# Patient Record
Sex: Female | Born: 1990 | Race: White | Hispanic: No | Marital: Married | Smoking: Never smoker
Health system: Southern US, Community
[De-identification: ages and names within clinical notes are randomized; demographics above are authoritative.]

## PROBLEM LIST (undated history)

## (undated) DIAGNOSIS — F411 Generalized anxiety disorder: Secondary | ICD-10-CM

## (undated) DIAGNOSIS — M419 Scoliosis, unspecified: Secondary | ICD-10-CM

## (undated) DIAGNOSIS — K3189 Other diseases of stomach and duodenum: Secondary | ICD-10-CM

## (undated) DIAGNOSIS — A020 Salmonella enteritis: Secondary | ICD-10-CM

## (undated) DIAGNOSIS — F4521 Hypochondriasis: Secondary | ICD-10-CM

## (undated) DIAGNOSIS — E669 Obesity, unspecified: Secondary | ICD-10-CM

## (undated) DIAGNOSIS — G894 Chronic pain syndrome: Secondary | ICD-10-CM

## (undated) DIAGNOSIS — B2790 Infectious mononucleosis, unspecified without complication: Secondary | ICD-10-CM

## (undated) DIAGNOSIS — N946 Dysmenorrhea, unspecified: Secondary | ICD-10-CM

## (undated) DIAGNOSIS — G43909 Migraine, unspecified, not intractable, without status migrainosus: Secondary | ICD-10-CM

## (undated) DIAGNOSIS — N939 Abnormal uterine and vaginal bleeding, unspecified: Secondary | ICD-10-CM

## (undated) DIAGNOSIS — E66811 Obesity, class 1: Secondary | ICD-10-CM

## (undated) DIAGNOSIS — Z8619 Personal history of other infectious and parasitic diseases: Secondary | ICD-10-CM

## (undated) DIAGNOSIS — O24419 Gestational diabetes mellitus in pregnancy, unspecified control: Secondary | ICD-10-CM

## (undated) DIAGNOSIS — R109 Unspecified abdominal pain: Secondary | ICD-10-CM

## (undated) DIAGNOSIS — O139 Gestational [pregnancy-induced] hypertension without significant proteinuria, unspecified trimester: Secondary | ICD-10-CM

## (undated) DIAGNOSIS — F319 Bipolar disorder, unspecified: Secondary | ICD-10-CM

## (undated) DIAGNOSIS — M5417 Radiculopathy, lumbosacral region: Secondary | ICD-10-CM

## (undated) DIAGNOSIS — R55 Syncope and collapse: Secondary | ICD-10-CM

## (undated) HISTORY — DX: Generalized anxiety disorder: F41.1

## (undated) HISTORY — DX: Dysmenorrhea, unspecified: N94.6

## (undated) HISTORY — DX: Infectious mononucleosis, unspecified without complication: B27.90

## (undated) HISTORY — DX: Migraine, unspecified, not intractable, without status migrainosus: G43.909

## (undated) HISTORY — DX: Personal history of other infectious and parasitic diseases: Z86.19

## (undated) HISTORY — DX: Obesity, unspecified: E66.9

## (undated) HISTORY — DX: Other diseases of stomach and duodenum: K31.89

## (undated) HISTORY — DX: Abnormal uterine and vaginal bleeding, unspecified: N93.9

## (undated) HISTORY — DX: Hypochondriasis: F45.21

## (undated) HISTORY — DX: Chronic pain syndrome: G89.4

## (undated) HISTORY — PX: WISDOM TOOTH EXTRACTION: SHX21

## (undated) HISTORY — DX: Radiculopathy, lumbosacral region: M54.17

## (undated) HISTORY — DX: Bipolar disorder, unspecified: F31.9

## (undated) HISTORY — DX: Salmonella enteritis: A02.0

## (undated) HISTORY — DX: Syncope and collapse: R55

## (undated) HISTORY — DX: Obesity, class 1: E66.811

---
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
## 2012-01-20 ENCOUNTER — Emergency Department (HOSPITAL_COMMUNITY)
Admission: EM | Admit: 2012-01-20 | Discharge: 2012-01-20 | Disposition: A | Discharge status: Home / Self Care | Payer: BC Managed Care – PPO | Attending: Emergency Medicine | Admitting: Emergency Medicine

## 2012-01-20 ENCOUNTER — Emergency Department (HOSPITAL_COMMUNITY): Payer: BC Managed Care – PPO

## 2012-01-20 ENCOUNTER — Encounter (HOSPITAL_COMMUNITY): Payer: Self-pay

## 2012-01-20 DIAGNOSIS — R109 Unspecified abdominal pain: Secondary | ICD-10-CM | POA: Insufficient documentation

## 2012-01-20 LAB — COMPREHENSIVE METABOLIC PANEL
ALT: 16 U/L (ref 0–35)
AST: 15 U/L (ref 0–37)
CO2: 25 mEq/L (ref 19–32)
Chloride: 109 mEq/L (ref 96–112)
GFR calc non Af Amer: >90 mL/min (ref >90)
Sodium: 142 mEq/L (ref 135–145)
Total Bilirubin: 0.2 mg/dL — ABNORMAL LOW (ref 0.3–1.2)

## 2012-01-20 LAB — URINALYSIS, ROUTINE W REFLEX MICROSCOPIC
Glucose, UA: NEGATIVE mg/dL
Leukocytes, UA: NEGATIVE
Protein, ur: NEGATIVE mg/dL
Urobilinogen, UA: 1 mg/dL (ref 0.0–1.0)

## 2012-01-20 LAB — URINE MICROSCOPIC-ADD ON

## 2012-01-20 LAB — CBC WITH DIFFERENTIAL/PLATELET
Basophils Absolute: 0 10*3/uL (ref 0.0–0.1)
Lymphocytes Relative: 28 % (ref 12–46)
Neutro Abs: 5.6 10*3/uL (ref 1.7–7.7)
Neutrophils Relative %: 66 % (ref 43–77)
Platelets: 274 10*3/uL (ref 150–400)
RDW: 12.4 % (ref 11.5–15.5)
WBC: 8.6 10*3/uL (ref 4.0–10.5)

## 2012-01-20 MED ORDER — SODIUM CHLORIDE 0.9 % IV BOLUS (SEPSIS)
1000.0000 mL | Freq: Once | INTRAVENOUS | Status: AC
  Administered 2012-01-20: 1000 mL via INTRAVENOUS

## 2012-01-20 MED ORDER — KETOROLAC TROMETHAMINE 30 MG/ML IJ SOLN
30.0000 mg | Freq: Once | INTRAMUSCULAR | Status: AC
  Administered 2012-01-20: 30 mg via INTRAVENOUS
  Filled 2012-01-20: qty 1

## 2012-01-20 MED ORDER — SODIUM CHLORIDE 0.9 % IV SOLN: Freq: Once | INTRAVENOUS | Status: DC

## 2012-01-20 MED ORDER — OXYCODONE-ACETAMINOPHEN 5-325 MG PO TABS: 1.0000 | ORAL_TABLET | ORAL | Status: AC | PRN

## 2012-01-20 MED ORDER — METOCLOPRAMIDE HCL 10 MG PO TABS: 10.0000 mg | ORAL_TABLET | Freq: Four times a day (QID) | ORAL | Status: DC | PRN

## 2012-01-20 MED ORDER — ONDANSETRON HCL 4 MG/2ML IJ SOLN
4.0000 mg | Freq: Once | INTRAMUSCULAR | Status: AC
  Administered 2012-01-20: 4 mg via INTRAVENOUS
  Filled 2012-01-20: qty 2

## 2012-01-20 NOTE — ED Notes (Signed)
Left in c/o significant other for transport home; alert; in no distress; instructions/prescriptions reviewed and f/u information provided; verbalizes understanding.

## 2012-01-20 NOTE — ED Notes (Signed)
Pt reports sharp, shooting, RLQ pain for the past 2 days.  Pt reports that the pain has worsened today.  Pt denies any n/v.

## 2012-01-20 NOTE — ED Provider Notes (Signed)
History   This chart was scribed for Dione Booze, MD by Melba Coon. The patient was seen in room APA01/APA01 and the patient's care was started at 4:55PM.    CSN: [id number]  Arrival date & time 01/20/12  1549   First MD Initiated Contact with Patient 01/20/12 1647      Chief Complaint  Patient presents with  . Abdominal Pain    (Consider location/radiation/quality/duration/timing/severity/associated sxs/prior treatment) The history is provided by the patient. No language interpreter was used.   Susan Chandler is a 21 y.o. female who presents to the Emergency Department complaining of intermittent, moderate, right-sided, sharp, abdominal and right-sided back pain with an onset 2 days ago. Pt states that the pain started around onset but went away as the day progressed; pain came back while pt was standing at work today. Standing up aggravates the pain while sitting down alleviates the pain. Pt rates the severity of the pain 6/10 at its worse but 4/10 presently. Pt has not taken OTC drugs at home for the pain. Nausea present. No HA, fever, neck pain, sore throat, rash, CP, SOB, vomit, diarrhea, dysuria, or extremity pain, edema, weakness, numbness, or tingling. No known allergies. No other pertinent medical symptoms.  History reviewed. No pertinent past medical history.  History reviewed. No pertinent past surgical history.  No family history on file.  History  Substance Use Topics  . Smoking status: Not on file  . Smokeless tobacco: Not on file  . Alcohol Use: No    OB History    Grav Para Term Preterm Abortions TAB SAB Ect Mult Living                  Review of Systems 10 Systems reviewed and all are negative for acute change except as noted in the HPI.   Allergies  Review of patient's allergies indicates no known allergies.  Home Medications   Current Outpatient Rx  Name Route Sig Dispense Refill  . NORGESTIM-ETH ESTRAD TRIPHASIC 0.18/0.215/0.25 MG-35 MCG PO  TABS Oral Take 1 tablet by mouth daily.      BP 121/89  Pulse 99  Temp 98 F (36.7 C) (Oral)  Ht 5\' 6"  (1.676 m)  Wt 175 lb (79.379 kg)  BMI 28.25 kg/m2  SpO2 98%  LMP 12/30/2011  Physical Exam  Nursing note and vitals reviewed. Constitutional: She is oriented to person, place, and time. She appears well-developed and well-nourished. No distress.  HENT:  Head: Normocephalic and atraumatic.  Right Ear: External ear normal.  Left Ear: External ear normal.  Eyes: EOM are normal.  Neck: Normal range of motion. No tracheal deviation present.  Cardiovascular: Normal rate, regular rhythm and normal heart sounds.   Pulmonary/Chest: Effort normal. No respiratory distress.  Abdominal: Soft. There is tenderness (Moderate RLQ).       Decreased bowel sounds.  Musculoskeletal: Normal range of motion. She exhibits tenderness (Moderate right CVA tenderness). She exhibits no edema.  Neurological: She is alert and oriented to person, place, and time.  Skin: Skin is warm and dry. No rash noted.  Psychiatric: She has a normal mood and affect. Her behavior is normal.    ED Course  Procedures (including critical care time)  DIAGNOSTIC STUDIES: Oxygen Saturation is 98% on room air, normal by my interpretation.    COORDINATION OF CARE:  5:00PM - EDMD will order IV fluids, toradol, zofran, abd CT, blood w/u, and UA for the pt.  Results for orders placed during the hospital encounter  of 01/20/12  URINALYSIS, ROUTINE W REFLEX MICROSCOPIC      Component Value Range   Color, Urine AMBER (*) YELLOW   APPearance CLEAR  CLEAR   Specific Gravity, Urine >1.030 (*) 1.005 - 1.030   pH 6.0  5.0 - 8.0   Glucose, UA NEGATIVE  NEGATIVE mg/dL   Hgb urine dipstick LARGE (*) NEGATIVE   Bilirubin Urine NEGATIVE  NEGATIVE   Ketones, ur NEGATIVE  NEGATIVE mg/dL   Protein, ur NEGATIVE  NEGATIVE mg/dL   Urobilinogen, UA 1.0  0.0 - 1.0 mg/dL   Nitrite NEGATIVE  NEGATIVE   Leukocytes, UA NEGATIVE  NEGATIVE    CBC WITH DIFFERENTIAL      Component Value Range   WBC 8.6  4.0 - 10.5 K/uL   RBC 4.01  3.87 - 5.11 MIL/uL   Hemoglobin 12.4  12.0 - 15.0 g/dL   HCT 16.1  09.6 - 04.5 %   MCV 90.0  78.0 - 100.0 fL   MCH 30.9  26.0 - 34.0 pg   MCHC 34.3  30.0 - 36.0 g/dL   RDW 40.9  81.1 - 91.4 %   Platelets 274  150 - 400 K/uL   Neutrophils Relative 66  43 - 77 %   Neutro Abs 5.6  1.7 - 7.7 K/uL   Lymphocytes Relative 28  12 - 46 %   Lymphs Abs 2.4  0.7 - 4.0 K/uL   Monocytes Relative 6  3 - 12 %   Monocytes Absolute 0.5  0.1 - 1.0 K/uL   Eosinophils Relative 1  0 - 5 %   Eosinophils Absolute 0.1  0.0 - 0.7 K/uL   Basophils Relative 0  0 - 1 %   Basophils Absolute 0.0  0.0 - 0.1 K/uL  COMPREHENSIVE METABOLIC PANEL      Component Value Range   Sodium 142  135 - 145 mEq/L   Potassium 4.0  3.5 - 5.1 mEq/L   Chloride 109  96 - 112 mEq/L   CO2 25  19 - 32 mEq/L   Glucose, Bld 101 (*) 70 - 99 mg/dL   BUN 12  6 - 23 mg/dL   Creatinine, Ser 7.82  0.50 - 1.10 mg/dL   Calcium 8.6  8.4 - 95.6 mg/dL   Total Protein 6.8  6.0 - 8.3 g/dL   Albumin 3.3 (*) 3.5 - 5.2 g/dL   AST 15  0 - 37 U/L   ALT 16  0 - 35 U/L   Alkaline Phosphatase 40  39 - 117 U/L   Total Bilirubin 0.2 (*) 0.3 - 1.2 mg/dL   GFR calc non Af Amer >90  >90 mL/min   GFR calc Af Amer >90  >90 mL/min  LIPASE, BLOOD      Component Value Range   Lipase 29  11 - 59 U/L  POCT PREGNANCY, URINE      Component Value Range   Preg Test, Ur NEGATIVE  NEGATIVE  URINE MICROSCOPIC-ADD ON      Component Value Range   Squamous Epithelial / LPF MANY (*) RARE   WBC, UA 11-20  <3 WBC/hpf   RBC / HPF 7-10  <3 RBC/hpf   Bacteria, UA FEW (*) RARE   Ct Abdomen Pelvis Wo Contrast  01/20/2012  *RADIOLOGY REPORT*  Clinical Data: Right flank pain  CT ABDOMEN AND PELVIS WITHOUT CONTRAST  Technique:  Multidetector CT imaging of the abdomen and pelvis was performed following the standard protocol without intravenous contrast.  Comparison:  None.  Findings:  Lung bases are unremarkable.  Sagittal images of the spine are unremarkable.  No destructive bony lesions are noted.  Unenhanced liver, spleen, pancreas and adrenals are unremarkable.  Gallbladder is contracted without evidence of calcified gallstones.  Unenhanced kidneys are symmetrical in size.  Faint bilateral calyceal calcifications are suggested without confluent calcified calculi.  No hydronephrosis or hydroureter.  No calcified ureteral calculi are noted bilaterally.  Stool noted in the right colon and cecum.  There is no pericecal inflammation.  The appendix is not identified.  The terminal ileum is unremarkable.  No small bowel obstruction.  No ascites or free air.  No adenopathy.  Abdominal aorta is unremarkable.  Bilateral distal ureter is unremarkable.  Unenhanced uterus and ovaries are unremarkable.  Urinary bladder is unremarkable.  No calcified calculi are noted within urinary bladder.  The uterus is retroflexed.  No destructive bony lesions are noted within pelvis.  IMPRESSION:  1.  Faint bilateral renal calyceal calcifications are suggested without confluent calcified calculi.  No ureteral calcified calculi are noted bilaterally.  No hydronephrosis or hydroureter. 2.  Stool noted in the right colon and cecum.  No pericecal inflammation.  The appendix is not identified.  Unremarkable terminal ileum. 3.  Retroflexed uterus. 4.  Unremarkable urinary bladder.  Original Report Authenticated By: Natasha Mead, M.D.      1. Right flank pain   2. Abdominal pain       MDM  Workup is associated negative. Mild pyuria is noted however, many epithelial cells are present indicating this is a contaminated specimen. CT is normal. She got good relief of pain withketorolac and ondansetron. She is in a prescription for Percocet and metoclopramide and told to use over-the-counter analgesics as needed and return to the emergency department as needed.  I personally performed the services described in this  documentation, which was scribed in my presence. The recorded information has been reviewed and considered.         Dione Booze, MD 01/20/12 1910

## 2013-06-24 DIAGNOSIS — M5417 Radiculopathy, lumbosacral region: Secondary | ICD-10-CM

## 2013-06-24 HISTORY — DX: Radiculopathy, lumbosacral region: M54.17

## 2013-07-15 ENCOUNTER — Other Ambulatory Visit: Payer: Self-pay | Admitting: Rehabilitation

## 2013-07-15 DIAGNOSIS — M545 Low back pain, unspecified: Secondary | ICD-10-CM

## 2013-07-17 ENCOUNTER — Ambulatory Visit
Admission: RE | Admit: 2013-07-17 | Discharge: 2013-07-17 | Disposition: A | Discharge status: Home / Self Care | Payer: BC Managed Care – PPO | Source: Ambulatory Visit | Attending: Rehabilitation | Admitting: Rehabilitation

## 2013-07-17 DIAGNOSIS — M545 Low back pain, unspecified: Secondary | ICD-10-CM

## 2013-10-18 ENCOUNTER — Ambulatory Visit (INDEPENDENT_AMBULATORY_CARE_PROVIDER_SITE_OTHER): Payer: 59 | Admitting: Family Medicine

## 2013-10-18 ENCOUNTER — Encounter: Payer: Self-pay | Admitting: Family Medicine

## 2013-10-18 VITALS — BP 108/77 | HR 113 | Temp 97.6°F | Resp 18 | Ht 66.0 in | Wt 204.0 lb

## 2013-10-18 DIAGNOSIS — M5416 Radiculopathy, lumbar region: Secondary | ICD-10-CM | POA: Insufficient documentation

## 2013-10-18 DIAGNOSIS — IMO0002 Reserved for concepts with insufficient information to code with codable children: Secondary | ICD-10-CM

## 2013-10-18 MED ORDER — OXYCODONE-ACETAMINOPHEN 5-325 MG PO TABS: ORAL_TABLET | ORAL | Status: DC

## 2013-10-18 MED ORDER — CYCLOBENZAPRINE HCL 10 MG PO TABS: 10.0000 mg | ORAL_TABLET | Freq: Three times a day (TID) | ORAL | Status: DC | PRN

## 2013-10-18 NOTE — Progress Notes (Signed)
Pre visit review using our clinic review tool, if applicable. No additional management support is needed unless otherwise documented below in the visit note. 

## 2013-10-18 NOTE — Progress Notes (Signed)
Office Note 10/18/2013  CC:  Chief Complaint  Patient presents with  . Back Pain    started back in October, MRI in epic, wants second opinion possible referral to another neurosurgeon    HPI:  Susan Chandler is a 23 y.o. White female who is here to establish care and discuss back pain. Patient's most recent primary MD: [facility] in [place], [place]. Old records were not reviewed prior to or during today's visit except [place] controlled substance website which showed one rx for oxycodone filled by pt 04/13/13 in [place], [place] by a [place] named Cherrie Gauze??  Also reviewed pt's back MRI from 06/2013.  Onset of back pain 03/2013 acutely, had been lifting/bending at work a lot to lift things but no distinct action/accident occurred that brought on immediate pain.  No workers comp claim.  The pain starts in low back and radiates down left glut and entire left upper leg, says she is never comfortable, worse standing up.  Occ tingle feeling in left thigh. Chiropracter was seen initially, no changes with this therapy.  Chiro and or her PMD sent her to Dr. Retia Passe in [place] ([facility] and MRI L spine was done 06/2013.  I reviewed results of this today: see section under "Lab" below.  She was given prednisone and things got a bit better and she was able to return to light duty at work. She then exacerbated her back pain about 3 wks ago--essentially the same pain intensity and location as prior to her MRI, at which time she went back to chiro for a couple of days until she could get back into NS.  NS visit #2: MD said to try another course of steroids (5 day pack) since this initially brought some improvement.  He apparently told pt she was going to have to deal with the pain and pt and her husband were put off by his manner, so they are asking for referral to new specialist for 2nd opinion.  Pt denies that any PT was ever prescribed.  Besides the prednisone, her Family MD initially rx'd naproxen and  this didn't help (back in Oct/Nov time frame). Gabapentin was rx'd 10/07/13 by her neurosurgeon and she didn't try this b/c of fear of the side effects. Currently she is taking only her OCP.  Her menses is due in the next few days.  Past Medical History  Diagnosis Date  . Infectious mononucleosis 2007    ?whooping cough, too??    Past Surgical History  Procedure Laterality Date  . Wisdom tooth extraction      History reviewed. No pertinent family history.  History   Social History  . Marital Status: Married    Spouse Name: N/A    Number of Children: N/A  . Years of Education: N/A   Occupational History  . Not on file.   Social History Main Topics  . Smoking status: Never Smoker   . Smokeless tobacco: Not on file  . Alcohol Use: No  . Drug Use: No  . Sexual Activity: Not on file   Other Topics Concern  . Not on file   Social History Narrative   Married, no children.   College: [employer].   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   No T/A/Ds.   MEDS: ortho tri cyclen 28  No Known Allergies  ROS Review of Systems  Constitutional: Negative for fever and fatigue.  HENT: Negative for congestion and sore throat.   Eyes: Negative  for visual disturbance.  Respiratory: Negative for cough.   Cardiovascular: Negative for chest pain.  Gastrointestinal: Negative for nausea and abdominal pain.  Genitourinary: Negative for dysuria.  Musculoskeletal: Positive for gait problem. Negative for arthralgias, joint swelling and neck pain.  Skin: Negative for rash.  Neurological: Negative for weakness and headaches.  Hematological: Negative for adenopathy.  Psychiatric/Behavioral: Negative for dysphoric mood. The patient is not nervous/anxious.     PE; Blood pressure 108/77, pulse 113, temperature 97.6 F (36.4 C), temperature source Temporal, resp. rate 18, height 5\' 6"  (1.676 m), weight 204 lb (92.534 kg), last menstrual period 09/22/2013, SpO2 97.00%. Gen: Alert, well  appearing, obese white female who walks slowly with the assistance of her husband.  Patient is oriented to person, place, time, and situation. AFFECT: pleasant, lucid thought and speech.  Judgement intact. WUJ:WJXBENT:Eyes: no injection, icteris, swelling, or exudate.  EOMI, PERRLA. Mouth: lips without lesion/swelling.  Oral mucosa pink and moist. Oropharynx without erythema, exudate, or swelling.  Neck - No masses or thyromegaly or limitation in range of motion CV: RRR, no m/r/g.   LUNGS: CTA bilat, nonlabored resps, good aeration in all lung fields. ABD: soft, NT EXT: no clubbing, cyanosis, or edema.  BACK: tender diffusely across entire lumbosacral spine region, some paraspinous muscle tightening, L>R. ROM of L spine significantly limited in all motions by pain, patient walks with antalgic gait, favoring right leg. She sits in a position that puts most of her weight on her right gluteal surface. Sitting SLR neg on right, + on left at 20 deg of extension (elicits low back pain). No hip tenderness or pain with hip ROM.  Fine touch sensation diminished on left foot diffusely. DP and PT pulses 2+ bilat. DTRs: patellar--3+ on right, 2+ on left.   Achilles--1+ on right, absent on left No deficit in strength on left when allowing for patient's pain with moving left leg.   Pertinent labs:  None today  Pertinent Imaging: Noncontrast MRI L spine 07/15/13:  IMPRESSION:  L5-S1 bulge and superimposed small central disc protrusion which  insinuates between the upper S1 nerve roots which are not compressed  or displaced. No thecal sac compromise.  L4-5 minimal to mild bulge most notable centrally with very minimal  indentation ventral aspect of the thecal sac. T11-12 through L3-4 unremarkable.  ASSESSMENT AND PLAN:   New pt: obtain old records.  Left lumbar radiculopathy This has been going on 34mo now. Symptoms prior to imaging were same as current symptoms.  Imaging showed no lesion that really  correlates with her symptoms. Given patient's dissatisfaction with her current back specialist, I will refer her for second opinion to [facility].  I do not think re-imaging of her back is indicated prior to her seeing Dr. Ethelene Halamos.  I stressed to patient that PT is likely going to help, and that further meds at this time should be limited to symptomatic meds used sparingly.  To this point, I rx'd percocet 5/325, #30 today, with instructions to take 1-2 q6h prn severe pain.  Also, flexeril 10mg , 1 tab q8h prn, #30, RF x 1.  No further steroids or NSAIDs at this time.   If appt with Dr. Ethelene Halamos is going to be >10 d from now, will go ahead and make local PT referral [facility]). At this time I cannot see her doing any work, seeing as how she can barely walk or bend her back without extreme discomfort.  Therefore I wrote her  a note excusing her from work from today to 11/17/13.     An After Visit Summary was printed and given to the patient.  Spent 45 min with pt today, with >50% of this time spent in counseling and care coordination regarding the above problems.  Return in about 1 month (around 11/17/2013) for f/u lumbar radiculopathy.

## 2013-10-18 NOTE — Assessment & Plan Note (Signed)
This has been going on 39mo now. Symptoms prior to imaging were same as current symptoms.  Imaging showed no lesion that really correlates with her symptoms. Given patient's dissatisfaction with her current back specialist, I will refer her for second opinion to [facility].  I do not think re-imaging of her back is indicated prior to her seeing Dr. Ethelene Halamos.  I stressed to patient that PT is likely going to help, and that further meds at this time should be limited to symptomatic meds used sparingly.  To this point, I rx'd percocet 5/325, #30 today, with instructions to take 1-2 q6h prn severe pain.  Also, flexeril 10mg , 1 tab q8h prn, #30, RF x 1.  No further steroids or NSAIDs at this time.   If appt with Dr. Ethelene Halamos is going to be >10 d from now, will go ahead and make local PT referral [facility]). At this time I cannot see her doing any work, seeing as how she can barely walk or bend her back without extreme discomfort.  Therefore I wrote her a note excusing her from work from today to 11/17/13.

## 2013-10-27 ENCOUNTER — Encounter: Payer: Self-pay | Admitting: Family Medicine

## 2013-11-17 ENCOUNTER — Encounter: Payer: Self-pay | Admitting: Family Medicine

## 2013-11-17 ENCOUNTER — Ambulatory Visit (INDEPENDENT_AMBULATORY_CARE_PROVIDER_SITE_OTHER): Payer: 59 | Admitting: Family Medicine

## 2013-11-17 VITALS — BP 108/75 | HR 100 | Temp 98.8°F | Ht 66.0 in | Wt 198.0 lb

## 2013-11-17 DIAGNOSIS — M5416 Radiculopathy, lumbar region: Secondary | ICD-10-CM

## 2013-11-17 DIAGNOSIS — IMO0002 Reserved for concepts with insufficient information to code with codable children: Secondary | ICD-10-CM

## 2013-11-17 NOTE — Progress Notes (Signed)
Pre visit review using our clinic review tool, if applicable. No additional management support is needed unless otherwise documented below in the visit note. 

## 2013-11-17 NOTE — Progress Notes (Signed)
OFFICE NOTE  11/17/2013  CC:  Chief Complaint  Patient presents with  . Follow-up    1 month     HPI: Patient is a 23 y.o. Caucasian female who is here for 1 mo f/u lumbar radiculopathy. I referred her to Dr. Shelle Iron last visit. Back injection recommended.  Her pain is stable.  Injection planned for 12/01/13. Patient taking minimal percocet and flexeril.  She is also awaiting insurance approval of PT.  Pertinent PMH:  Past Medical History  Diagnosis Date  . Infectious mononucleosis 2007    ?whooping cough, too??  . Lumbosacral radiculopathy at L5 2015    [facility].   Past Surgical History  Procedure Laterality Date  . Wisdom tooth extraction     History   Social History Narrative   Married, no children.   College: [employer].   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   Moved to [place], [place] in 2015.   No T/A/Ds.   No exercise.    MEDS:  Outpatient Prescriptions Prior to Visit  Medication Sig Dispense Refill  . cyclobenzaprine (FLEXERIL) 10 MG tablet Take 1 tablet (10 mg total) by mouth 3 (three) times daily as needed for muscle spasms.  30 tablet  1  . Norgestimate-Ethinyl Estradiol Triphasic (ORTHO TRI-CYCLEN, 28,) 0.18/0.215/0.25 MG-35 MCG tablet Take 1 tablet by mouth daily.      [name] oxyCODONE-acetaminophen (PERCOCET/ROXICET) 5-325 MG per tablet 1-2 tabs po q6h prn severe pain  30 tablet  0   No facility-administered medications prior to visit.    PE: Blood pressure 108/75, pulse 100, temperature 98.8 F (37.1 C), temperature source Temporal, height 5\' 6"  (1.676 m), weight 198 lb (89.812 kg), last menstrual period 09/22/2013, SpO2 98.00%. Gen: Alert, well appearing.  Patient is oriented to person, place, time, and situation. Left lumbar region TTP.  No spinous process tenderness.  Sitting SLR on left side elicits left LB with radiation into glut region with minimal extension of left lower leg.   Mild weakness in quadriceps and  hamstrings due to pain.  No other extremity weakness noted.  No sensory deficit.  IMPRESSION AND PLAN:  1) Chronic lumbar radiculopathy: has been evaluated by ortho, is set to get spinal steroid injection and also PT. She has muscle relaxer and pain med to take prn--no new rx's given today. She is remaining out of work at this time, which I think is appropriate.  An After Visit Summary was printed and given to the patient.  FOLLOW UP: prn

## 2013-12-21 ENCOUNTER — Telehealth: Payer: Self-pay

## 2013-12-21 ENCOUNTER — Encounter: Payer: Self-pay | Admitting: Family Medicine

## 2013-12-21 MED ORDER — OXYCODONE-ACETAMINOPHEN 5-325 MG PO TABS: ORAL_TABLET | ORAL | Status: DC

## 2013-12-21 NOTE — Telephone Encounter (Signed)
Oxycodone/APAP rx printed.

## 2013-12-21 NOTE — Telephone Encounter (Signed)
Pt called requesting a refill on her oxycodone. She states she has received an injection from her doctor and it only helped a little bit. Her doctor wants to spread her injections out and didn't schedule her for another one. Her physical therapy appt is coming up also.  I advised her that she may have to get Rx from her ortho/neuro doctor. She wanted me to check with Dr. Milinda CaveMcGowen first. Please advise.

## 2013-12-22 NOTE — Telephone Encounter (Signed)
Called pt to let her know, Rx ready to pick up.

## 2014-02-04 ENCOUNTER — Encounter: Payer: Self-pay | Admitting: Family Medicine

## 2014-02-04 ENCOUNTER — Ambulatory Visit (INDEPENDENT_AMBULATORY_CARE_PROVIDER_SITE_OTHER): Payer: 59 | Admitting: Family Medicine

## 2014-02-04 VITALS — BP 108/77 | HR 62 | Temp 98.6°F | Resp 18 | Ht 66.0 in | Wt 186.0 lb

## 2014-02-04 DIAGNOSIS — G4700 Insomnia, unspecified: Secondary | ICD-10-CM

## 2014-02-04 DIAGNOSIS — F411 Generalized anxiety disorder: Secondary | ICD-10-CM | POA: Insufficient documentation

## 2014-02-04 MED ORDER — ZOLPIDEM TARTRATE 10 MG PO TABS: 10.0000 mg | ORAL_TABLET | Freq: Every evening | ORAL | Status: DC | PRN

## 2014-02-04 MED ORDER — CITALOPRAM HYDROBROMIDE 20 MG PO TABS: 20.0000 mg | ORAL_TABLET | Freq: Every day | ORAL | Status: DC

## 2014-02-04 NOTE — Progress Notes (Signed)
OFFICE NOTE  02/04/2014  CC:  Chief Complaint  Patient presents with  . Insomnia    x October   . Back Pain   HPI: Patient is a 23 y.o. Caucasian female who is here for insomnia.   Starting around 03/2013, problems initiating sleep and maintaining sleep.  Oxycodone for pain helps her back pain but she says it does not activate her or make her insomnia worse.  Not working right now. Usually tries to sleep from 11 p-9 A.  No napping in daytime.  Has TV on with volume off--"for the light" which is no change from prior to having probs sleeping.  No RLS but some constant shifting due to LB and left leg radiculopathy.  She has had one ESI and there is plan to get a 2nd one in the near future.  She has tried melatonin, helps her relax some.   Sister gave her a 5mg  ambien and it didn't help any.  Pt has never been rx'd any sleep med. She is frustrated when she is laying there not sleeping.  She chronically worries--this has been over about the past year, question of some panic sx's associated with right sided chest pains in the past. Beats herself up over not being able to work.  Denies depressed, sad, or hopeless mood. Has never been on antidepressant as treatment for mood or anxiety d/o.   Pertinent PMH:  Past Medical History  Diagnosis Date  . Infectious mononucleosis 2007    ?whooping cough, too??  . Lumbosacral radiculopathy at L5 2015    [facility]; then [facility], where she got ESI left L5-S1 11/2013 with moderate improvement.   Past Surgical History  Procedure Laterality Date  . Wisdom tooth extraction     History   Social History Narrative   Married, no children.   College: [employer].   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   [place] in [place].   No T/A/Ds.   No exercise.    MEDS: Not taking flexeril listed below. Outpatient Prescriptions Prior to Visit  Medication Sig Dispense Refill  .  Norgestimate-Ethinyl Estradiol Triphasic (ORTHO TRI-CYCLEN, 28,) 0.18/0.215/0.25 MG-35 MCG tablet Take 1 tablet by mouth daily.      . cyclobenzaprine (FLEXERIL) 10 MG tablet Take 1 tablet (10 mg total) by mouth 3 (three) times daily as needed for muscle spasms.  30 tablet  1  . oxyCODONE-acetaminophen (PERCOCET/ROXICET) 5-325 MG per tablet 1-2 tabs po q6h prn severe pain  30 tablet  0   No facility-administered medications prior to visit.    PE: Blood pressure 108/77, pulse 62, temperature 98.6 F (37 C), temperature source Temporal, resp. rate 18, height 5\' 6"  (1.676 m), weight 186 lb (84.369 kg), SpO2 98.00%. Gen: Alert, well appearing.  Patient is oriented to person, place, time, and situation. AFFECT: pleasant, lucid thought and speech. No further exam today.  IMPRESSION AND PLAN:  GAD with insomnia. Start citalopram 20mg  qd.  Start ambien 10mg  qhs prn.  Therapeutic expectations and side effect profile of medications discussed today.  Patient's questions answered.  An After Visit Summary was printed and given to the patient.  FOLLOW UP: 22mo

## 2014-02-04 NOTE — Progress Notes (Signed)
Pre visit review using our clinic review tool, if applicable. No additional management support is needed unless otherwise documented below in the visit note. 

## 2014-02-07 ENCOUNTER — Telehealth: Payer: Self-pay | Admitting: Family Medicine

## 2014-02-07 MED ORDER — OXYCODONE-ACETAMINOPHEN 5-325 MG PO TABS: ORAL_TABLET | ORAL | Status: DC

## 2014-02-07 NOTE — Telephone Encounter (Signed)
LM for pt to p/u Rx at front desk.

## 2014-02-07 NOTE — Telephone Encounter (Signed)
Pt forgot to ask you at her past appointment for refill of oxycodone.  Last Rx printed 12/21/13 # 30.  Please advise.

## 2014-02-07 NOTE — Telephone Encounter (Signed)
Printed oxycodone rx per pt request.

## 2014-02-25 ENCOUNTER — Encounter: Payer: Self-pay | Admitting: Family Medicine

## 2014-02-25 ENCOUNTER — Ambulatory Visit (INDEPENDENT_AMBULATORY_CARE_PROVIDER_SITE_OTHER): Payer: 59 | Admitting: Family Medicine

## 2014-02-25 VITALS — BP 110/75 | HR 70 | Temp 98.8°F | Resp 16 | Ht 66.0 in | Wt 183.0 lb

## 2014-02-25 DIAGNOSIS — H6980 Other specified disorders of Eustachian tube, unspecified ear: Secondary | ICD-10-CM

## 2014-02-25 DIAGNOSIS — H6990 Unspecified Eustachian tube disorder, unspecified ear: Secondary | ICD-10-CM

## 2014-02-25 DIAGNOSIS — F411 Generalized anxiety disorder: Secondary | ICD-10-CM

## 2014-02-25 DIAGNOSIS — G4700 Insomnia, unspecified: Secondary | ICD-10-CM

## 2014-02-25 MED ORDER — FLUTICASONE PROPIONATE 50 MCG/ACT NA SUSP: 2.0000 | Freq: Every day | NASAL | Status: DC

## 2014-02-25 MED ORDER — ZOLPIDEM TARTRATE 10 MG PO TABS: 10.0000 mg | ORAL_TABLET | Freq: Every evening | ORAL | Status: DC | PRN

## 2014-02-25 MED ORDER — CITALOPRAM HYDROBROMIDE 20 MG PO TABS: 20.0000 mg | ORAL_TABLET | Freq: Every day | ORAL | Status: DC

## 2014-02-25 NOTE — Progress Notes (Signed)
OFFICE NOTE  02/25/2014  CC:  Chief Complaint  Patient presents with  . Otalgia    both ear, worse in R ear  . Ear Fullness   HPI: Patient is a 23 y.o. Caucasian female who is here for ears feeling full/hurting, R>L.   Onset about 1 wk ago, constant and not getting better.  Nose feels slightly congested but she has no mucous. No fevers or cough.  HA in forehead region x 2d.   No upper teeth pain.  ST upon awakening in morning but she is a mouth breather.  No ear drainage noted.  She says she is doing much improved regarding her anxiety since being on citalopram  qd. Also sleeping well taking ambien nightly.   Pertinent PMH:  Past medical, surgical, social, and family history reviewed and no changes are noted since last office visit.  MEDS:  Outpatient Prescriptions Prior to Visit  Medication Sig Dispense Refill  . citalopram (CELEXA) 20 MG tablet Take 1 tablet (20 mg total) by mouth daily.  30 tablet  1  . Norgestimate-Ethinyl Estradiol Triphasic (ORTHO TRI-CYCLEN, 28,) 0.18/0.215/0.25 MG-35 MCG tablet Take 1 tablet by mouth daily.      [name] oxyCODONE-acetaminophen (PERCOCET/ROXICET) 5-325 MG per tablet 1-2 tabs po q6h prn severe pain  60 tablet  0  . zolpidem (AMBIEN) 10 MG tablet Take 1 tablet (10 mg total) by mouth at bedtime as needed for sleep.  30 tablet  1   No facility-administered medications prior to visit.    PE: Blood pressure 110/75, pulse 70, temperature 98.8 F (37.1 C), temperature source Temporal, resp. rate 16, height  (1.676 m), weight 183 lb (83.008 kg), SpO2 99.00%. Gen: Alert, well appearing.  Patient is oriented to person, place, time, and situation. ZOX:WRUE: no injection, icteris, swelling, or exudate.  EOMI, PERRLA.  EACs normal.  TM's retracted bilat, with normal light reflex and landmarks, without erythema or middle ear fluid. Mouth: lips without lesion/swelling.  Oral mucosa pink and moist. Oropharynx without erythema, exudate, or swelling.  No  paranasal sinus TTP or facial swelling.  No TMJ TTP. CV: RRR, no m/r/g.   LUNGS: CTA bilat, nonlabored resps, good aeration in all lung fields.   IMPRESSION AND PLAN:  1) Eustachian tube dysfunction: start saline nasal spray bid and flonase qd.  2) GAD with insomnia: improving on citalopram  qd and nightly ambien. RF's of these meds given today.  An After Visit Summary was printed and given to the patient.  FOLLOW UP: 75mo

## 2014-02-25 NOTE — Progress Notes (Signed)
Pre visit review using our clinic review tool, if applicable. No additional management support is needed unless otherwise documented below in the visit note. 

## 2014-02-25 NOTE — Patient Instructions (Signed)
Buy OTC saline nasal spray and use 2-3 sprays each nostril 2 times per day to irrigate and moisturize your nasal passages.

## 2014-03-07 ENCOUNTER — Ambulatory Visit: Payer: 59 | Admitting: Family Medicine

## 2014-03-16 ENCOUNTER — Telehealth: Payer: Self-pay | Admitting: Family Medicine

## 2014-03-16 NOTE — Telephone Encounter (Signed)
Pt Susan Chandler requesting rf of oxycodone.  Patient's last OV was 02/25/14.  Patient last RF printed on 02/07/14.  Please advise rf.

## 2014-03-17 MED ORDER — OXYCODONE-ACETAMINOPHEN 5-325 MG PO TABS: ORAL_TABLET | ORAL | Status: DC

## 2014-03-17 NOTE — Telephone Encounter (Signed)
Oxycodone rx printed. 

## 2014-03-18 NOTE — Telephone Encounter (Signed)
LMOM for pt to p/u Rx.

## 2014-04-12 ENCOUNTER — Encounter: Payer: Self-pay | Admitting: Family Medicine

## 2014-04-12 ENCOUNTER — Ambulatory Visit (INDEPENDENT_AMBULATORY_CARE_PROVIDER_SITE_OTHER): Payer: 59 | Admitting: Family Medicine

## 2014-04-12 VITALS — BP 103/70 | HR 66 | Temp 98.4°F | Resp 18 | Ht 66.0 in | Wt 174.0 lb

## 2014-04-12 DIAGNOSIS — M5417 Radiculopathy, lumbosacral region: Secondary | ICD-10-CM

## 2014-04-12 DIAGNOSIS — M5416 Radiculopathy, lumbar region: Secondary | ICD-10-CM

## 2014-04-12 DIAGNOSIS — G4700 Insomnia, unspecified: Secondary | ICD-10-CM

## 2014-04-12 MED ORDER — OXYCODONE-ACETAMINOPHEN 5-325 MG PO TABS: ORAL_TABLET | ORAL | Status: DC

## 2014-04-12 MED ORDER — CYCLOBENZAPRINE HCL 10 MG PO TABS: 10.0000 mg | ORAL_TABLET | Freq: Three times a day (TID) | ORAL | Status: DC | PRN

## 2014-04-12 NOTE — Progress Notes (Signed)
OFFICE NOTE  04/12/2014  CC:  Chief Complaint  Patient presents with  . Back Pain   HPI: Patient is a 23 y.o. Caucasian female who is here for back pain.   Onset of severe back pain the last few days "back to level of last year". Intensity 9/10, stabbing pain on left L/S intersection level and radiates down left leg.  No numbness or tingling in leg.  No saddle anesthesia, no loss of bowel/bladder control. Taking only 1/2 percocet 1-2 times per day usually.  Last few days taking   Uses a cane to help her walk today. PT was recommended in the past but cost was too prohibitive. She [facility] Dr. Ethelene Halamos, on Nov 4th. Not taking any NSAIDs currently.  Of note, zolpidem has been very helpful for her insomnia, has helped her get many good nights of rest and feel better overall.  Pertinent PMH:  Past medical, surgical, social, and family history reviewed and no changes are noted since last office visit.  MEDS:  Outpatient Prescriptions Prior to Visit  Medication Sig Dispense Refill  . citalopram (CELEXA) 20 MG tablet Take 1 tablet (20 mg total) by mouth daily.  30 tablet  6  . fluticasone (FLONASE) 50 MCG/ACT nasal spray Place 2 sprays into both nostrils daily.  16 g  6  . Norgestimate-Ethinyl Estradiol Triphasic (ORTHO TRI-CYCLEN, 28,) 0.18/0.215/0.25 MG-35 MCG tablet Take 1 tablet by mouth daily.      [name]. oxyCODONE-acetaminophen (PERCOCET/ROXICET) 5-325 MG per tablet 1-2 tabs po q6h prn severe pain  60 tablet  0  . zolpidem (AMBIEN) 10 MG tablet Take 1 tablet (10 mg total) by mouth at bedtime as needed for sleep.  30 tablet  5   No facility-administered medications prior to visit.    PE: Blood pressure 103/70, pulse 66, temperature 98.4 F (36.9 C), temperature source Temporal, resp. rate 18, height 5\' 6"  (1.676 m), weight 174 lb (78.926 kg), SpO2 97.00%. Gen: Alert, well appearing.  Patient is oriented to person, place, time, and situation. Walks  slowly, with a cane. Marked decreased ROM of entire L spine in all directions due to pain. Very TTP around left L5/S1 facet joint, less tender in midline L/S spine. +Sitting SLR on right at 60 deg--this caused pain in right LB region +Sitting SLR at 20 deg on left--this caused radicular pain down left leg. LE strength 5-/5 prox/dist bilat due to pain.  DTRs 1+ patellar and achilles reflexes bilat.  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 Percocet 5/325, 1-2 tabs po q6h prn, #120. Flexeril 10mg  1 tab tid prn, #30, RF x 5. Ibuprofen 600 mg bid w/food x 10d.   [facility]14 [facility].  2) Insomnia; improved with ambien 10mg  qhs.  An After Visit Summary was printed and given to the patient.  FOLLOW UP: prn

## 2014-04-12 NOTE — Progress Notes (Signed)
Pre visit review using our clinic review tool, if applicable. No additional management support is needed unless otherwise documented below in the visit note. 

## 2014-06-24 DIAGNOSIS — R55 Syncope and collapse: Secondary | ICD-10-CM

## 2014-06-24 HISTORY — DX: Syncope and collapse: R55

## 2014-07-07 ENCOUNTER — Other Ambulatory Visit: Payer: Self-pay | Admitting: *Deleted

## 2014-07-07 NOTE — Telephone Encounter (Signed)
Patient left vm requesting that her oxycodone be changed to hydrocodone.because oxy makes her sick. Called pt back to find out what kind of "sick" med is causing.

## 2014-07-08 NOTE — Telephone Encounter (Signed)
Patient stated that the oxycodone make her nauseous and sick on her stomach. Patient wants to try a different pain medication. Please advise?

## 2014-07-11 MED ORDER — HYDROCODONE-ACETAMINOPHEN 5-325 MG PO TABS: 1.0000 | ORAL_TABLET | Freq: Four times a day (QID) | ORAL | Status: DC | PRN

## 2014-07-11 NOTE — Telephone Encounter (Signed)
Patient aware. Patient has appt 08/26/14.

## 2014-07-11 NOTE — Telephone Encounter (Signed)
Vicodin rx printed.  Pt needs office visit for f/u pain in the next 1-2 months.-thx

## 2014-07-25 ENCOUNTER — Telehealth: Payer: Self-pay | Admitting: Family Medicine

## 2014-07-25 NOTE — Telephone Encounter (Signed)
Pt concerned about her ambien.  It has been causing hallucinations and pt will sleep walk but when she wakes up she doesn't remember.   It does help her sleep but she's concerned with side effects.  Can patient stop taking this or does she have to tapper?  She states she does take it nightly.

## 2014-07-25 NOTE — Telephone Encounter (Signed)
Patient aware.

## 2014-07-25 NOTE — Telephone Encounter (Signed)
She can just stop taking it, should be fine.

## 2014-08-11 ENCOUNTER — Ambulatory Visit (INDEPENDENT_AMBULATORY_CARE_PROVIDER_SITE_OTHER): Payer: 59 | Admitting: Family Medicine

## 2014-08-11 ENCOUNTER — Encounter: Payer: Self-pay | Admitting: Family Medicine

## 2014-08-11 VITALS — BP 115/76 | HR 80 | Temp 99.1°F | Resp 18 | Ht 66.0 in | Wt 156.0 lb

## 2014-08-11 DIAGNOSIS — M5416 Radiculopathy, lumbar region: Secondary | ICD-10-CM

## 2014-08-11 DIAGNOSIS — F411 Generalized anxiety disorder: Secondary | ICD-10-CM

## 2014-08-11 DIAGNOSIS — G4700 Insomnia, unspecified: Secondary | ICD-10-CM

## 2014-08-11 MED ORDER — TRAZODONE HCL 50 MG PO TABS: ORAL_TABLET | ORAL | Status: DC

## 2014-08-11 MED ORDER — GABAPENTIN 300 MG PO CAPS: 300.0000 mg | ORAL_CAPSULE | Freq: Three times a day (TID) | ORAL | Status: DC

## 2014-08-11 NOTE — Progress Notes (Signed)
Pre visit review using our clinic review tool, if applicable. No additional management support is needed unless otherwise documented below in the visit note. 

## 2014-08-11 NOTE — Progress Notes (Signed)
OFFICE NOTE  08/11/2014  CC:  Chief Complaint  Patient presents with  . Follow-up   HPI: Patient is a 24 y.o. Caucasian female who is here for 4 mo f/u anxiety, chronic lumbar radiculopathy pain, insomnia. Doing well regarding anxiety since being on the citalopram.  Much calmer, not irritable or on edge nearly as much, mood is stable. Sleep still problematic, she had parasomnia-type behavior on ambien so stopped it. Z-quil helping some but she asks about a rx for something else.   Lumbar back pain with radiculopathy: Vicodin prn, using this less since starting herbal med called curamin (2-3 tabs a day) and coconut oil 2-3 tabs per day.  F/u planned with ortho (including repeat MRI). She started taking gabapentin 300 mg bid that Dr. Retia PasseSaullo had rx'd in the past and says it is helping with radiculopathy pain in left leg.  Asks if she can increase to three times a day.   Pertinent PMH:  Past medical, surgical, social, and family history reviewed and no changes are noted since last office visit.  MEDS:  Outpatient Prescriptions Prior to Visit  Medication Sig Dispense Refill  . citalopram (CELEXA) 20 MG tablet Take 1 tablet (20 mg total) by mouth daily. 30 tablet 6  . cyclobenzaprine (FLEXERIL) 10 MG tablet Take 1 tablet (10 mg total) by mouth 3 (three) times daily as needed for muscle spasms. 30 tablet 5  . HYDROcodone-acetaminophen (NORCO/VICODIN) 5-325 MG per tablet Take 1-2 tablets by mouth every 6 (six) hours as needed for moderate pain. 120 tablet 0  . Norgestimate-Ethinyl Estradiol Triphasic (ORTHO TRI-CYCLEN, 28,) 0.18/0.215/0.25 MG-35 MCG tablet Take 1 tablet by mouth daily.    . fluticasone (FLONASE) 50 MCG/ACT nasal spray Place 2 sprays into both nostrils daily. (Patient not taking: Reported on 08/11/2014) 16 g 6  . zolpidem (AMBIEN) 10 MG tablet Take 1 tablet (10 mg total) by mouth at bedtime as needed for sleep. 30 tablet 5   No facility-administered medications prior to visit.     PE: Blood pressure 115/76, pulse 80, temperature 99.1 F (37.3 C), temperature source Temporal, resp. rate 18, height 5\' 6"  (1.676 m), weight 156 lb (70.761 kg), SpO2 99 %. Gen: Alert, well appearing.  Patient is oriented to person, place, time, and situation. AFFECT: pleasant, lucid thought and speech. No further exam.  IMPRESSION AND PLAN:  1) GAD: much improved, will continue 20mg  qd citalopram.  2) Chronic lumbar radiculopathy pain: stable/improving. She'll continue current herbals, although I warned her not to use these in combo with NSAIDs due to possible increased risk of bleeding. Increase gabapentin to 300mg  tid dosing. Continue prn Vicodin: patient does not need a RF of this at this time. Continue ortho f/u as she has been instructed by Dr. Varney BilesBeane/colleagues.  3) Insomnia: not well controlled on OTC med. Ambien-intolerant. Will try low dose trazodone: 50mg  tab, start at 1/2 tab and titrate up every 4-5 days prn to max of 100mg  qhs. Therapeutic expectations and side effect profile of medication discussed today.  Patient's questions answered.  An After Visit Summary was printed and given to the patient.  FOLLOW UP: 6 mo

## 2014-08-26 ENCOUNTER — Telehealth: Payer: Self-pay | Admitting: *Deleted

## 2014-08-26 ENCOUNTER — Ambulatory Visit: Payer: 59 | Admitting: Family Medicine

## 2014-08-26 NOTE — Telephone Encounter (Signed)
Patient called office and left vm requesting lidocaine patches. Patient stated that she saw you 08/11/14 and she was wondering if you could perscribe this without having to be seen? Please advise?

## 2014-08-28 NOTE — Telephone Encounter (Signed)
The only indication that I can rx lidocaine patches for is post-herpetic neuralgia (otherwise insurance won't pay). She does not have this diagnosis.  Only pain management specialists can rx it for other indications (like musculoskeletal low back pain).-thx

## 2014-08-29 NOTE — Telephone Encounter (Signed)
LMOVM

## 2014-08-29 NOTE — Telephone Encounter (Signed)
Patient notified

## 2014-08-29 NOTE — Telephone Encounter (Signed)
LMOVM for pt to return call 

## 2014-09-05 ENCOUNTER — Ambulatory Visit (INDEPENDENT_AMBULATORY_CARE_PROVIDER_SITE_OTHER): Payer: 59 | Admitting: Family Medicine

## 2014-09-05 DIAGNOSIS — Z23 Encounter for immunization: Secondary | ICD-10-CM

## 2014-10-20 ENCOUNTER — Encounter: Payer: Self-pay | Admitting: Family Medicine

## 2014-10-20 ENCOUNTER — Ambulatory Visit (INDEPENDENT_AMBULATORY_CARE_PROVIDER_SITE_OTHER): Payer: 59 | Admitting: Family Medicine

## 2014-10-20 VITALS — BP 109/74 | HR 95 | Temp 98.8°F | Resp 18 | Ht 66.0 in | Wt 162.0 lb

## 2014-10-20 DIAGNOSIS — F410 Panic disorder [episodic paroxysmal anxiety] without agoraphobia: Secondary | ICD-10-CM

## 2014-10-20 DIAGNOSIS — G90A Postural orthostatic tachycardia syndrome (POTS): Secondary | ICD-10-CM

## 2014-10-20 DIAGNOSIS — R000 Tachycardia, unspecified: Secondary | ICD-10-CM | POA: Diagnosis not present

## 2014-10-20 DIAGNOSIS — I951 Orthostatic hypotension: Secondary | ICD-10-CM

## 2014-10-20 DIAGNOSIS — R55 Syncope and collapse: Secondary | ICD-10-CM

## 2014-10-20 DIAGNOSIS — I498 Other specified cardiac arrhythmias: Secondary | ICD-10-CM

## 2014-10-20 DIAGNOSIS — F411 Generalized anxiety disorder: Secondary | ICD-10-CM

## 2014-10-20 LAB — CBC WITH DIFFERENTIAL/PLATELET
BASOS ABS: 0 10*3/uL (ref 0.0–0.1)
Basophils Relative: 0.4 % (ref 0.0–3.0)
EOS ABS: 0.1 10*3/uL (ref 0.0–0.7)
Eosinophils Relative: 1.4 % (ref 0.0–5.0)
HCT: 39.8 % (ref 36.0–46.0)
Hemoglobin: 13.4 g/dL (ref 12.0–15.0)
LYMPHS PCT: 35.3 % (ref 12.0–46.0)
Lymphs Abs: 1.9 10*3/uL (ref 0.7–4.0)
MCHC: 33.7 g/dL (ref 30.0–36.0)
MCV: 91.5 fl (ref 78.0–100.0)
Monocytes Absolute: 0.4 10*3/uL (ref 0.1–1.0)
Monocytes Relative: 6.7 % (ref 3.0–12.0)
NEUTROS PCT: 56.2 % (ref 43.0–77.0)
Neutro Abs: 3 10*3/uL (ref 1.4–7.7)
Platelets: 278 10*3/uL (ref 150.0–400.0)
RBC: 4.35 Mil/uL (ref 3.87–5.11)
RDW: 12.9 % (ref 11.5–15.5)
WBC: 5.3 10*3/uL (ref 4.0–10.5)

## 2014-10-20 LAB — COMPREHENSIVE METABOLIC PANEL
ALT: 13 U/L (ref 0–35)
AST: 15 U/L (ref 0–37)
Albumin: 4.1 g/dL (ref 3.5–5.2)
Alkaline Phosphatase: 48 U/L (ref 39–117)
BILIRUBIN TOTAL: 0.3 mg/dL (ref 0.2–1.2)
BUN: 12 mg/dL (ref 6–23)
CALCIUM: 9.2 mg/dL (ref 8.4–10.5)
CHLORIDE: 104 meq/L (ref 96–112)
CO2: 30 meq/L (ref 19–32)
Creatinine, Ser: 0.59 mg/dL (ref 0.40–1.20)
GFR: 132.21 mL/min (ref >60.00)
GLUCOSE: 84 mg/dL (ref 70–99)
Potassium: 4.8 mEq/L (ref 3.5–5.1)
SODIUM: 138 meq/L (ref 135–145)
TOTAL PROTEIN: 6.6 g/dL (ref 6.0–8.3)

## 2014-10-20 LAB — TSH: TSH: 2.14 u[IU]/mL (ref 0.35–4.50)

## 2014-10-20 MED ORDER — CITALOPRAM HYDROBROMIDE 40 MG PO TABS: 40.0000 mg | ORAL_TABLET | Freq: Every day | ORAL | Status: DC

## 2014-10-20 NOTE — Progress Notes (Signed)
Pre visit review using our clinic review tool, if applicable. No additional management support is needed unless otherwise documented below in the visit note. 

## 2014-10-20 NOTE — Progress Notes (Signed)
Office Note 10/23/2014  CC:  Presyncope and anxiety  HPI:  Susan Chandler is a 24 y.o. White female who is here accompanied by her mother for 4-5 mo hx of gradually worsening periods of posturally induced presyncopal esisodes.  Never syncope.  She feels the lightheaded sx's, feeling of tunnel vision, feels the color go from her and then immediately looks for a place to sit down and does so before she has ever completely passed out. Sometimes bp 90s over 60s when this happens, no info about HR from pt. Sometimes bp low end of normal.  Periods last minutes and sometimes occur 1-2 times a day and sometimes once every 2-3 days.  Never provoked by anxiety but often BRING significant anxiety.   Also admits to increased anxiety/panic sx's recently, unassociated with any periods of presyncope/postural changes. Feels unprovoked, "out of the blue" episodes of chest heaviness, mild lightheaded feeling but not feeling like she is going to pass out, SOB, very anxious, tingling in extremities, some mild nausea, sx's peak in minutes but take 20-30 min to completely abate and the rest of her day she feels worn out.   Source of anxiety lately is a sister who has moved back in her home with them and she is "troublesome". Pt is dieting/eating healthy, drinking plenty of fluids, denies any abnormal eating habits/fasting/purging/guilt after eating: this is all corroborated by her mother.  She does 30-40 min of CV exercise daily and zumba class for 1 hr on two days a week.   Takes no OTC supplements/"diuretics"/laxitives. Trazodone was started most recently but she takes this only prn.  She does not feel like either her presyncope or her anxiety lately is related to intake of any particular med.  She doesn't drink alcohol, smoke, or do illicit drugs.  ROS: no HA's, no fevers, no palpitations, no joint aches or myalgias.  She has chronic LBP which has been improved lately with all the exercise she is doing.  No  polydipsia, polyuria, or polyphagia.   Past Medical History  Diagnosis Date  . Infectious mononucleosis 2007    ?whooping cough, too??  . Lumbosacral radiculopathy at L5 2015    [facility]; then 2nd opinion with Dr. Shelle Iron at [facility], where she got ESI left L5-S1 11/2013 with moderate improvement.  [name] GAD (generalized anxiety disorder)     Past Surgical History  Procedure Laterality Date  . Wisdom tooth extraction      No family history on file.  History   Social History  . Marital Status: Married    Spouse Name: N/A  . Number of Children: N/A  . Years of Education: N/A   Occupational History  . Not on file.   Social History Main Topics  . Smoking status: Never Smoker   . Smokeless tobacco: Not on file  . Alcohol Use: No  . Drug Use: No  . Sexual Activity: Not on file   Other Topics Concern  . Not on file   Social History Narrative   Married, no children.   College: [employer].   Occupation: Used to work at [employer] in [employer]   Moved to [place], [place] in 2015.   No T/A/Ds.   No exercise.    Outpatient Prescriptions Prior to Visit  Medication Sig Dispense Refill  . Coconut Oil 1000 MG CAPS Take 2,000 mg by mouth 2 (two) times daily.    . cyclobenzaprine (FLEXERIL) 10 MG tablet Take 1 tablet (10 mg  total) by mouth 3 (three) times daily as needed for muscle spasms. 30 tablet 5  . gabapentin (NEURONTIN) 300 MG capsule Take 1 capsule (300 mg total) by mouth 3 (three) times daily. 90 capsule 6  . HYDROcodone-acetaminophen (NORCO/VICODIN) 5-325 MG per tablet Take 1-2 tablets by mouth every 6 (six) hours as needed for moderate pain. 120 tablet 0  . NON FORMULARY Take by mouth 3 (three) times daily. Curamin    . Norgestimate-Ethinyl Estradiol Triphasic (ORTHO TRI-CYCLEN, 28,) 0.18/0.215/0.25 MG-35 MCG tablet Take 1 tablet by mouth daily.    . traZODone (DESYREL) 50 MG tablet 1-2 tabs po qhs prn insomnia 60 tablet 6  . citalopram (CELEXA)  20 MG tablet Take 1 tablet (20 mg total) by mouth daily. 30 tablet 6  . fluticasone (FLONASE) 50 MCG/ACT nasal spray Place 2 sprays into both nostrils daily. 16 g 6   No facility-administered medications prior to visit.    No Known Allergies  ROS ROS: see HPI PE; Blood pressure 109/74, pulse 95, temperature 98.8 F (37.1 C), temperature source Temporal, resp. rate 18, height  (1.676 m), weight 162 lb (73.483 kg), SpO2 99 %. Wt is down 42 lbs compared to 10/18/13 wt done here (1 yr ago) Orthostatics: supine bp 111/74, HR 75.  Sitting upright bp 109/71, HR 88.  Standing BP 117/81, HR 96. Gen: Alert, well appearing.  Patient is oriented to person, place, time, and situation. ENT: Ears: EACs clear, normal epithelium.  TMs with good light reflex and landmarks bilaterally.  Eyes: no injection, icteris, swelling, or exudate.  EOMI, PERRLA. Nose: no drainage or turbinate edema/swelling.  No injection or focal lesion.  Mouth: lips without lesion/swelling.  Oral mucosa pink and moist.  Dentition intact and without obvious caries or gingival swelling.  Oropharynx without erythema, exudate, or swelling.  Neck - No masses or thyromegaly or limitation in range of motion CV: RRR, no m/r/g.   LUNGS: CTA bilat, nonlabored resps, good aeration in all lung fields. ABD: soft, NT, ND, BS normal.  No hepatospenomegaly or mass.  No bruits. EXT: no clubbing, cyanosis, or edema.  Skin - no sores or suspicious lesions or rashes or color changes Neuro: CN 2-12 intact bilaterally, strength 5/5 in proximal and distal upper extremities and lower extremities bilaterally.  No sensory deficits.  No tremor.  No disdiadochokinesis.  No ataxia.  Upper extremity and lower extremity DTRs symmetric.  No pronator drift.  Pertinent labs:   Lab Results  Component Value Date   WBC 5.3 10/20/2014   HGB 13.4 10/20/2014   HCT 39.8 10/20/2014   MCV 91.5 10/20/2014   PLT 278.0 10/20/2014   Lab Results  Component Value Date    CREATININE 0.59 10/20/2014   BUN 12 10/20/2014   NA 138 10/20/2014   K 4.8 10/20/2014   CL 104 10/20/2014   CO2 30 10/20/2014   Lab Results  Component Value Date   ALT 13 10/20/2014   AST 15 10/20/2014   ALKPHOS 48 10/20/2014   BILITOT 0.3 10/20/2014   EKG today: NSR, rate 74, normal ST segments, normal voltages and intervals and axis.  She has U waves in several leads. In fact, her baseline between end of T wave and her P wave is almost suggestive of atrial flutter waves but I do not think these are choppy/jagged enough in morphology to be flutter waves, PLUS these little waves are not uniform in appearance.    ASSESSMENT AND PLAN:   1) Presyncope, posturally related.  Quite dramatic.  Although pt reports some home bp's in low range when this occurs, she has only some increase in HR with orthostatic measurements here.   Hard to tell how much of this is related to her ongoing/increased anxiety as well as her dramatic improvement/hard work on [employer]. Postural orthostatic tachycardia syndrome is possible, as is paroxysmal dysrhythmia.  Orthostatic hypotension/dysautonomia also certainly possible but would have expected more of a BP drop with posture changes today. Will do 48H holter monitor, check CBC, CMET, and TSH. If holter neg, will ask cardiology to see her for consideration of further w/u such as tilt table testing and/or echo.  2) GAD with panic disorder: first step is to maximize her citalopram to 40mg  qd dosing.    Pt understands our approach and agrees with plan.  Spent 35 min with pt today, with >50% of this time spent in counseling and care coordination regarding the above problems.  FOLLOW UP:  Return in about 2 weeks (around 11/03/2014) for f/u presyncope (30 min).

## 2014-10-20 NOTE — Patient Instructions (Signed)
No vigorous exercise for now. Increase sodium in your diet and drink plenty of fluids.

## 2014-10-31 ENCOUNTER — Encounter (INDEPENDENT_AMBULATORY_CARE_PROVIDER_SITE_OTHER): Payer: 59

## 2014-10-31 ENCOUNTER — Encounter: Payer: Self-pay | Admitting: *Deleted

## 2014-10-31 ENCOUNTER — Other Ambulatory Visit: Payer: Self-pay | Admitting: *Deleted

## 2014-10-31 DIAGNOSIS — R55 Syncope and collapse: Secondary | ICD-10-CM

## 2014-10-31 DIAGNOSIS — G90A Postural orthostatic tachycardia syndrome (POTS): Secondary | ICD-10-CM

## 2014-10-31 DIAGNOSIS — R000 Tachycardia, unspecified: Secondary | ICD-10-CM

## 2014-10-31 DIAGNOSIS — I951 Orthostatic hypotension: Secondary | ICD-10-CM

## 2014-10-31 DIAGNOSIS — I498 Other specified cardiac arrhythmias: Secondary | ICD-10-CM

## 2014-10-31 NOTE — Progress Notes (Signed)
Patient ID: Susan Chandler, female   DOB: 05/10/1991, 24 y.o.   MRN: [id number] [facility] 48 hour holter monitor applied to patient.

## 2014-11-03 ENCOUNTER — Ambulatory Visit: Payer: 59 | Admitting: Family Medicine

## 2014-11-10 ENCOUNTER — Ambulatory Visit: Payer: 59 | Admitting: Family Medicine

## 2014-11-10 ENCOUNTER — Encounter: Payer: Self-pay | Admitting: Family Medicine

## 2014-11-14 ENCOUNTER — Other Ambulatory Visit: Payer: Self-pay | Admitting: *Deleted

## 2014-11-14 MED ORDER — HYDROCODONE-ACETAMINOPHEN 5-325 MG PO TABS: 1.0000 | ORAL_TABLET | Freq: Four times a day (QID) | ORAL | Status: DC | PRN

## 2014-11-14 NOTE — Telephone Encounter (Signed)
Pt called requesting refill for her Hyrdocodone/APAP. LOV 10/20/14, up coming ov 02/09/15, last written 07/08/14.

## 2014-12-30 ENCOUNTER — Telehealth: Payer: Self-pay | Admitting: Family Medicine

## 2014-12-30 MED ORDER — OXYCODONE-ACETAMINOPHEN 5-325 MG PO TABS: 1.0000 | ORAL_TABLET | Freq: Four times a day (QID) | ORAL | Status: DC | PRN

## 2014-12-30 NOTE — Telephone Encounter (Signed)
OK, rx for percocet printed.

## 2014-12-30 NOTE — Telephone Encounter (Signed)
Please advise 

## 2014-12-30 NOTE — Telephone Encounter (Addendum)
Pt's mother called and states that pt is unable to walk or move in bed due to her back pain. She had a shot Nov 4 and will get the next one July 19. She is requesting a stronger pain med to help her get through till her sot appt. Pt states the hydrocodone she is on does not even touch the pain. Please call with instruction @  [phone].

## 2014-12-30 NOTE — Telephone Encounter (Signed)
rx at the front desk.  Pt aware.

## 2015-01-03 ENCOUNTER — Telehealth: Payer: Self-pay | Admitting: *Deleted

## 2015-01-03 NOTE — Telephone Encounter (Signed)
Pt LMOM stating that she would like to wean off her Celexa. She stated that she is currently taking 40mg  but still has some 20mg  tablets. She would like for Dr. Milinda CaveMcGowen to advise how she needs to wean off her Celexa.

## 2015-01-03 NOTE — Telephone Encounter (Signed)
Pt advised and voiced understanding. She did mention that she wanted to wean off the Celexa because she felt it was causing her to have decreased sex drive, thoughts of death (not hurting herself or suicide but just of dying), emotional changes being happy one minute then crying the next. Per Layne these can be symptoms of Celexa and pt should follow instructions below to wean off. Pt advised and voiced understanding. She has an apt on 02/09/15 and will f/u then unless symtoms worsen before then.

## 2015-01-03 NOTE — Telephone Encounter (Signed)
Take citalopram 20 mg per day for seven days, then take 20 mg citalopram every other day for seven doses, then stop.

## 2015-02-09 ENCOUNTER — Ambulatory Visit: Payer: 59 | Admitting: Family Medicine

## 2015-02-17 ENCOUNTER — Ambulatory Visit (INDEPENDENT_AMBULATORY_CARE_PROVIDER_SITE_OTHER): Payer: 59 | Admitting: Family Medicine

## 2015-02-17 ENCOUNTER — Encounter: Payer: Self-pay | Admitting: Family Medicine

## 2015-02-17 VITALS — BP 128/83 | HR 78 | Temp 98.6°F | Resp 16 | Ht 66.0 in | Wt 170.0 lb

## 2015-02-17 DIAGNOSIS — G894 Chronic pain syndrome: Secondary | ICD-10-CM | POA: Diagnosis not present

## 2015-02-17 DIAGNOSIS — F410 Panic disorder [episodic paroxysmal anxiety] without agoraphobia: Secondary | ICD-10-CM | POA: Diagnosis not present

## 2015-02-17 DIAGNOSIS — F411 Generalized anxiety disorder: Secondary | ICD-10-CM | POA: Diagnosis not present

## 2015-02-17 DIAGNOSIS — F4000 Agoraphobia, unspecified: Secondary | ICD-10-CM | POA: Diagnosis not present

## 2015-02-17 NOTE — Progress Notes (Signed)
OFFICE VISIT  02/19/2015   CC:  Chief Complaint  Patient presents with  . Follow-up   HPI:    Patient is a 24 y.o. Caucasian female who presents with her mom for 4 mo f/u presyncope (posturally induced) and GAD. Citalopram made her too panicky per pt.  Has been off it for a month or so. Still feels frequent palpitations, has panic attacks about 3 d/week.  Acute onset of feeling SOB, palpitations, vision becomes tunnel.  No actual syncope.  Gets extremely anxious.  Lately more emotional, crying more easily. Has agoraphobia.  Can't go to anything by herself.  Her generalized anxiety seems to rule her life.  All sx's worse at night. At home, "in her bubble", she feels fine.  She is open to counseling.  Went to ortho office again this week, saw Dr. Shelle Iron, who pt says does not think she needs any back procedures, pt says he wanted her to ask her PCP for rheum w/u.   Apparently, Sabrina has lots more pain/paresthesia complaints than just her low back and left leg. We began to get into this today and then stopped due to time constraints and decided to tackle this at another visit. She apparently got minimal relief recently from oxycodone.  Continues to take flexeril and gabapentin.   Past Medical History  Diagnosis Date  . Infectious mononucleosis 2007    ?whooping cough, too??  . Lumbosacral radiculopathy at L5 2015    Dr. Retia Passe at [facility]; then 2nd opinion with Dr. Shelle Iron at [facility], where she got ESI left L5-S1 11/2013 with moderate improvement.  [name] GAD (generalized anxiety disorder)   . Pre-syncope 2016    ? POTS.  Holter showed mostly NSR, occ PACs, one run of nonsustained atrial tachy.    Past Surgical History  Procedure Laterality Date  . Wisdom tooth extraction      Outpatient Prescriptions Prior to Visit  Medication Sig Dispense Refill  . Coconut Oil 1000 MG CAPS Take 2,000 mg by mouth 2 (two) times daily.    . cyclobenzaprine (FLEXERIL) 10 MG  tablet Take 1 tablet (10 mg total) by mouth 3 (three) times daily as needed for muscle spasms. 30 tablet 5  . gabapentin (NEURONTIN) 300 MG capsule Take 1 capsule (300 mg total) by mouth 3 (three) times daily. 90 capsule 6  . HYDROcodone-acetaminophen (NORCO/VICODIN) 5-325 MG per tablet Take 1-2 tablets by mouth every 6 (six) hours as needed for moderate pain. 120 tablet 0  . NON FORMULARY Take by mouth 3 (three) times daily. Curamin    . Norgestimate-Ethinyl Estradiol Triphasic (ORTHO TRI-CYCLEN, 28,) 0.18/0.215/0.25 MG-35 MCG tablet Take 1 tablet by mouth daily.    [name] oxyCODONE-acetaminophen (PERCOCET/ROXICET) 5-325 MG per tablet Take 1-2 tablets by mouth every 6 (six) hours as needed for severe pain. 60 tablet 0  . traZODone (DESYREL) 50 MG tablet 1-2 tabs po qhs prn insomnia 60 tablet 6  . citalopram (CELEXA) 40 MG tablet Take 1 tablet (40 mg total) by mouth daily. (Patient not taking: Reported on 02/17/2015) 30 tablet 3   No facility-administered medications prior to visit.    No Known Allergies  ROS As per HPI  PE: Blood pressure 128/83, pulse 78, temperature 98.6 F (37 C), temperature source Oral, resp. rate 16, height  (1.676 m), weight 170 lb (77.111 kg), last menstrual period 01/01/2015, SpO2 97 %. Gen: Alert, well appearing.  Patient is oriented to person, place, time, and situation. No further exam  today.  LABS:  none  IMPRESSION AND PLAN:  1) Chronic severe GAD with agoraphobia and panic disorder: pt wants to get pregnant so she is averse to med trial at this time. I counseled against pregnancy at this difficult time in her life, citing the pregnancy as a likely huge stressor.   She did agree to counseling, though, and I gave her info to contact Ollen Gross, PhD/psychologist.  2) Chronic LB pain with L radiculopathy: apparently there is much more to her pain complaint than this. We had to postpone this to another visit, which she'll make at her convenience.   No  med rx's needed today for this.  Spent 30 min with pt today, with >50% of this time spent in counseling and care coordination regarding the above problems.  An After Visit Summary was printed and given to the patient.  FOLLOW UP: Return for return at pt's convenience for 45 min pain appt.

## 2015-02-17 NOTE — Patient Instructions (Signed)
Contact Ollen Gross, PhD (counselor) [phone]

## 2015-02-17 NOTE — Progress Notes (Signed)
Pre visit review using our clinic review tool, if applicable. No additional management support is needed unless otherwise documented below in the visit note. 

## 2015-03-03 ENCOUNTER — Ambulatory Visit (INDEPENDENT_AMBULATORY_CARE_PROVIDER_SITE_OTHER): Payer: 59 | Admitting: Family Medicine

## 2015-03-03 ENCOUNTER — Encounter: Payer: Self-pay | Admitting: Family Medicine

## 2015-03-03 VITALS — BP 105/72 | HR 79 | Temp 98.0°F | Resp 16 | Ht 66.0 in | Wt 171.0 lb

## 2015-03-03 DIAGNOSIS — M5410 Radiculopathy, site unspecified: Secondary | ICD-10-CM

## 2015-03-03 DIAGNOSIS — M545 Low back pain: Secondary | ICD-10-CM

## 2015-03-03 DIAGNOSIS — G8929 Other chronic pain: Secondary | ICD-10-CM

## 2015-03-03 DIAGNOSIS — M5416 Radiculopathy, lumbar region: Secondary | ICD-10-CM
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
## 2015-03-03 NOTE — Progress Notes (Signed)
Pre visit review using our clinic review tool, if applicable. No additional management support is needed unless otherwise documented below in the visit note. 

## 2015-03-06 ENCOUNTER — Other Ambulatory Visit: Payer: Self-pay | Admitting: Family Medicine

## 2015-03-06 DIAGNOSIS — M545 Low back pain, unspecified: Secondary | ICD-10-CM

## 2015-03-06 DIAGNOSIS — M5416 Radiculopathy, lumbar region: Secondary | ICD-10-CM

## 2015-03-06 DIAGNOSIS — G8929 Other chronic pain: Secondary | ICD-10-CM

## 2015-03-14 ENCOUNTER — Ambulatory Visit: Payer: 59 | Admitting: Neurology

## 2015-03-17 ENCOUNTER — Ambulatory Visit (HOSPITAL_COMMUNITY)
Admission: RE | Admit: 2015-03-17 | Discharge: 2015-03-17 | Disposition: A | Discharge status: Home / Self Care | Payer: 59 | Source: Ambulatory Visit | Attending: Family Medicine | Admitting: Family Medicine

## 2015-03-17 DIAGNOSIS — M5126 Other intervertebral disc displacement, lumbar region: Secondary | ICD-10-CM | POA: Insufficient documentation

## 2015-03-17 DIAGNOSIS — M545 Low back pain, unspecified: Secondary | ICD-10-CM

## 2015-03-17 DIAGNOSIS — M5416 Radiculopathy, lumbar region: Secondary | ICD-10-CM

## 2015-03-17 DIAGNOSIS — G8929 Other chronic pain: Secondary | ICD-10-CM

## 2015-03-23 ENCOUNTER — Ambulatory Visit: Payer: 59 | Admitting: Neurology

## 2015-03-27 ENCOUNTER — Telehealth: Payer: Self-pay | Admitting: *Deleted

## 2015-03-27 ENCOUNTER — Ambulatory Visit: Payer: 59 | Admitting: Neurology

## 2015-03-27 NOTE — Telephone Encounter (Signed)
Pt LMOM on 03/27/15 at 1:15pm requesting MRI results from 03/17/15. Please advise. Thanks.

## 2015-03-27 NOTE — Telephone Encounter (Signed)
Patient called to cancel the morning of her scheduled new patient appointment.

## 2015-03-28 NOTE — Telephone Encounter (Signed)
Pt LMOM on 03/28/15 at 8:59am returning Dr. Verdis Frederickson call.   I was unable to access by VM due to PW being changed and no notice was sent to me.   Pt advised that Dr. Milinda Cave is out of the office this afternoon but will return her call when he comes back in. I apologized for the delay, pt voiced understanding.

## 2015-03-31 NOTE — Telephone Encounter (Signed)
Results have been discussed with pt.

## 2015-04-06 ENCOUNTER — Other Ambulatory Visit: Payer: Self-pay | Admitting: *Deleted

## 2015-04-06 MED ORDER — GABAPENTIN 300 MG PO CAPS: 300.0000 mg | ORAL_CAPSULE | Freq: Three times a day (TID) | ORAL | Status: DC

## 2015-04-06 NOTE — Telephone Encounter (Signed)
Pt LMOM on 04/06/15 at 9:17am  RF request for gabapentin LOV: 03/03/15 Next ov: None Last written: 08/11/14 #90 w/ 6RF  Please advise. Thanks.

## 2015-04-17 ENCOUNTER — Other Ambulatory Visit: Payer: Self-pay | Admitting: Family Medicine

## 2015-04-17 NOTE — Telephone Encounter (Signed)
Last RX 08/11/14 x 6 rfs.   Please advise.

## 2015-04-18 MED ORDER — TRAZODONE HCL 50 MG PO TABS: ORAL_TABLET | ORAL | Status: DC

## 2015-04-25 ENCOUNTER — Telehealth: Payer: Self-pay | Admitting: Family Medicine

## 2015-04-25 NOTE — Telephone Encounter (Signed)
Please advise. Thanks.  

## 2015-04-25 NOTE — Telephone Encounter (Signed)
Patient's mother called for patient to ask if patient can increase her Gapapentin from 1 pill 3 times a day to 2 pills 3 times a day. She mentioned that her sister sees Dr. Claiborne BillingsKuneff & she has Rx'd the higher dose for her sister & it works well for her.

## 2015-04-26 ENCOUNTER — Other Ambulatory Visit: Payer: Self-pay | Admitting: *Deleted

## 2015-04-26 MED ORDER — GABAPENTIN 300 MG PO CAPS: ORAL_CAPSULE | ORAL | Status: DC

## 2015-04-26 NOTE — Telephone Encounter (Signed)
Pt requesting refill with new sig for 2 capsules 3 x daily. Please advise. Thanks.

## 2015-04-26 NOTE — Telephone Encounter (Signed)
Yes, may increase to 2 tabs three times per day.-thx

## 2015-04-26 NOTE — Telephone Encounter (Signed)
Left detailed message on cell vm, okay per DPR.  

## 2015-04-27 NOTE — Telephone Encounter (Signed)
Left detailed message on cell vm, okay per DPR.  

## 2015-07-13 ENCOUNTER — Other Ambulatory Visit: Payer: 59 | Admitting: Obstetrics & Gynecology

## 2015-07-24 ENCOUNTER — Other Ambulatory Visit (HOSPITAL_COMMUNITY)
Admission: RE | Admit: 2015-07-24 | Discharge: 2015-07-24 | Disposition: A | Discharge status: Home / Self Care | Payer: 59 | Source: Ambulatory Visit | Attending: Obstetrics & Gynecology | Admitting: Obstetrics & Gynecology

## 2015-07-24 ENCOUNTER — Ambulatory Visit (INDEPENDENT_AMBULATORY_CARE_PROVIDER_SITE_OTHER): Payer: 59 | Admitting: Obstetrics & Gynecology

## 2015-07-24 ENCOUNTER — Encounter: Payer: Self-pay | Admitting: Obstetrics & Gynecology

## 2015-07-24 VITALS — BP 110/70 | HR 72 | Ht 63.0 in | Wt 172.4 lb

## 2015-07-24 DIAGNOSIS — Z01419 Encounter for gynecological examination (general) (routine) without abnormal findings: Secondary | ICD-10-CM | POA: Diagnosis not present

## 2015-07-24 MED ORDER — ADAPALENE-BENZOYL PEROXIDE 0.3-2.5 % EX GEL: 1.0000 [drp] | Freq: Every day | CUTANEOUS | Status: DC

## 2015-07-24 NOTE — Progress Notes (Signed)
Patient ID: Susan Chandler, female   DOB: Jul 07, 1990, 25 y.o.   MRN: [id number] Subjective:     Susan Chandler is a 25 y.o. female here for a routine exam.  Patient's last menstrual period was 07/12/2015. No obstetric history on file. Birth Control Method:  none Menstrual Calendar(currently): a bit irregular  Current complaints: acne--> epiduo, differin gel + benzoyl peroxide.   Current acute medical issues:  none   Recent Gynecologic History Patient's last menstrual period was 07/12/2015. Last Pap: 2015,  normal Last mammogram: ,    Past Medical History  Diagnosis Date  . Infectious mononucleosis 2007    ?whooping cough, too??  . Lumbosacral radiculopathy at L5 2015    Dr. Retia Passe at [facility]; then 2nd opinion with Dr. Shelle Iron at [facility], where she got ESI left L5-S1 11/2013 with moderate improvement.  [name] GAD (generalized anxiety disorder)   . Pre-syncope 2016    ? POTS.  Holter showed mostly NSR, occ PACs, one run of nonsustained atrial tachy.    Past Surgical History  Procedure Laterality Date  . Wisdom tooth extraction      OB History    No data available      Social History   Social History  . Marital Status: Married    Spouse Name: N/A  . Number of Children: N/A  . Years of Education: N/A   Social History Main Topics  . Smoking status: Never Smoker   . Smokeless tobacco: None  . Alcohol Use: No  . Drug Use: No  . Sexual Activity: Not Asked   Other Topics Concern  . None   Social History Narrative   Married, no children.   College: [employer].   Occupation: Used to work at [employer] in [employer]   Moved to [place], [place] in 2015.   No T/A/Ds.   No exercise.    Family History  Problem Relation Age of Onset  . Cancer Maternal Aunt      Current outpatient prescriptions:  .  Coconut Oil 1000 MG CAPS, Take 2,000 mg by mouth 2 (two) times daily., Disp: , Rfl:  .  gabapentin (NEURONTIN) 300 MG capsule, 2 caps po tid, Disp: 180 capsule,  Rfl: 6 .  HYDROcodone-acetaminophen (NORCO/VICODIN) 5-325 MG per tablet, Take 1-2 tablets by mouth every 6 (six) hours as needed for moderate pain., Disp: 120 tablet, Rfl: 0 .  traZODone (DESYREL) 50 MG tablet, 1-2 tabs po qhs prn insomnia, Disp: 60 tablet, Rfl: 6 .  Adapalene-Benzoyl Peroxide (EPIDUO FORTE) 0.3-2.5 % GEL, Apply 1 drop topically at bedtime., Disp: 45 g, Rfl: 11 .  citalopram (CELEXA) 40 MG tablet, Take 40 mg by mouth daily. Reported on 07/24/2015, Disp: , Rfl: 3 .  oxyCODONE-acetaminophen (PERCOCET/ROXICET) 5-325 MG per tablet, Take 1-2 tablets by mouth every 6 (six) hours as needed for severe pain. (Patient not taking: Reported on 07/24/2015), Disp: 60 tablet, Rfl: 0  Review of Systems  Review of Systems  Constitutional: Negative for fever, chills, weight loss, malaise/fatigue and diaphoresis.  HENT: Negative for hearing loss, ear pain, nosebleeds, congestion, sore throat, neck pain, tinnitus and ear discharge.   Eyes: Negative for blurred vision, double vision, photophobia, pain, discharge and redness.  Respiratory: Negative for cough, hemoptysis, sputum production, shortness of breath, wheezing and stridor.   Cardiovascular: Negative for chest pain, palpitations, orthopnea, claudication, leg swelling and PND.  Gastrointestinal: negative for abdominal pain. Negative for heartburn, nausea, vomiting, diarrhea, constipation, blood in stool and melena.  Genitourinary: Negative for dysuria, urgency, frequency, hematuria and flank pain.  Musculoskeletal: Negative for myalgias, back pain, joint pain and falls.  Skin: Negative for itching and rash.  Neurological: Negative for dizziness, tingling, tremors, sensory change, speech change, focal weakness, seizures, loss of consciousness, weakness and headaches.  Endo/Heme/Allergies: Negative for environmental allergies and polydipsia. Does not bruise/bleed easily.  Psychiatric/Behavioral: Negative for depression, suicidal ideas,  hallucinations, memory loss and substance abuse. The patient is not nervous/anxious and does not have insomnia.        Objective:  Blood pressure 110/70, pulse 72, height  (1.6 m), weight 172 lb 6.4 oz (78.2 kg), last menstrual period 07/12/2015.   Physical Exam  Vitals reviewed. Constitutional: She is oriented to person, place, and time. She appears well-developed and well-nourished.  HENT:  Head: Normocephalic and atraumatic.        Right Ear: External ear normal.  Left Ear: External ear normal.  Nose: Nose normal.  Mouth/Throat: Oropharynx is clear and moist.  Eyes: Conjunctivae and EOM are normal. Pupils are equal, round, and reactive to light. Right eye exhibits no discharge. Left eye exhibits no discharge. No scleral icterus.  Neck: Normal range of motion. Neck supple. No tracheal deviation present. No thyromegaly present.  Cardiovascular: Normal rate, regular rhythm, normal heart sounds and intact distal pulses.  Exam reveals no gallop and no friction rub.   No murmur heard. Respiratory: Effort normal and breath sounds normal. No respiratory distress. She has no wheezes. She has no rales. She exhibits no tenderness.  GI: Soft. Bowel sounds are normal. She exhibits no distension and no mass. There is no tenderness. There is no rebound and no guarding.  Genitourinary:  Breasts no masses skin changes or nipple changes bilaterally      Vulva is normal without lesions Vagina is pink moist without discharge Cervix normal in appearance and pap is done Uterus is normal size shape and contour Adnexa is negative with normal sized ovaries   Musculoskeletal: Normal range of motion. She exhibits no edema and no tenderness.  Neurological: She is alert and oriented to person, place, and time. She has normal reflexes. She displays normal reflexes. No cranial nerve deficit. She exhibits normal muscle tone. Coordination normal.  Skin: Skin is warm and dry. No rash noted. No erythema. No  pallor.  Psychiatric: She has a normal mood and affect. Her behavior is normal. Judgment and thought content normal.       Assessment:    Healthy female exam.    Plan:    Follow up in: 1 year. epiduo for acne

## 2015-07-26 LAB — CYTOLOGY - PAP

## 2015-08-16 ENCOUNTER — Other Ambulatory Visit: Payer: Self-pay | Admitting: *Deleted

## 2015-08-16 MED ORDER — OXYCODONE-ACETAMINOPHEN 5-325 MG PO TABS: 1.0000 | ORAL_TABLET | Freq: Four times a day (QID) | ORAL | Status: DC | PRN

## 2015-08-16 NOTE — Telephone Encounter (Signed)
Rx up front ready for p/u. Left message on cell vm advising pt.

## 2015-08-16 NOTE — Telephone Encounter (Signed)
Pt LMOM on 08/15/15 at 9:57am requesting refill for oxycodone. Pt stated that she is having back pain again.   RF request for oxycodone LOV: 03/03/15 Next ov: None Last written: 12/30/14 #60 w/ 9UE  Please advise. Thanks.

## 2015-09-12 ENCOUNTER — Encounter: Payer: Self-pay | Admitting: Family Medicine

## 2015-09-12 ENCOUNTER — Ambulatory Visit (INDEPENDENT_AMBULATORY_CARE_PROVIDER_SITE_OTHER): Payer: 59 | Admitting: Family Medicine

## 2015-09-12 VITALS — BP 107/73 | HR 110 | Temp 99.7°F | Resp 20 | Wt 165.8 lb

## 2015-09-12 DIAGNOSIS — J0100 Acute maxillary sinusitis, unspecified: Secondary | ICD-10-CM | POA: Insufficient documentation

## 2015-09-12 MED ORDER — AMOXICILLIN-POT CLAVULANATE 875-125 MG PO TABS: 1.0000 | ORAL_TABLET | Freq: Two times a day (BID) | ORAL | Status: DC

## 2015-09-12 MED ORDER — PREDNISONE 50 MG PO TABS: 50.0000 mg | ORAL_TABLET | Freq: Every day | ORAL | Status: DC

## 2015-09-12 NOTE — Patient Instructions (Signed)
Sinusitis, Adult Sinusitis is redness, soreness, and inflammation of the paranasal sinuses. Paranasal sinuses are air pockets within the bones of your face. They are located beneath your eyes, in the middle of your forehead, and above your eyes. In healthy paranasal sinuses, mucus is able to drain out, and air is able to circulate through them by way of your nose. However, when your paranasal sinuses are inflamed, mucus and air can become trapped. This can allow bacteria and other germs to grow and cause infection. Sinusitis can develop quickly and last only a short time (acute) or continue over a long period (chronic). Sinusitis that lasts for more than 12 weeks is considered chronic. CAUSES Causes of sinusitis include:  Allergies.  Structural abnormalities, such as displacement of the cartilage that separates your nostrils (deviated septum), which can decrease the air flow through your nose and sinuses and affect sinus drainage.  Functional abnormalities, such as when the small hairs (cilia) that line your sinuses and help remove mucus do not work properly or are not present. SIGNS AND SYMPTOMS Symptoms of acute and chronic sinusitis are the same. The primary symptoms are pain and pressure around the affected sinuses. Other symptoms include:  Upper toothache.  Earache.  Headache.  Bad breath.  Decreased sense of smell and taste.  A cough, which worsens when you are lying flat.  Fatigue.  Fever.  Thick drainage from your nose, which often is green and may contain pus (purulent).  Swelling and warmth over the affected sinuses. DIAGNOSIS Your health care provider will perform a physical exam. During your exam, your health care provider may perform any of the following to help determine if you have acute sinusitis or chronic sinusitis:  Look in your nose for signs of abnormal growths in your nostrils (nasal polyps).  Tap over the affected sinus to check for signs of  infection.  View the inside of your sinuses using an imaging device that has a light attached (endoscope). If your health care provider suspects that you have chronic sinusitis, one or more of the following tests may be recommended:  Allergy tests.  Nasal culture. A sample of mucus is taken from your nose, sent to a lab, and screened for bacteria.  Nasal cytology. A sample of mucus is taken from your nose and examined by your health care provider to determine if your sinusitis is related to an allergy. TREATMENT Most cases of acute sinusitis are related to a viral infection and will resolve on their own within 10 days. Sometimes, medicines are prescribed to help relieve symptoms of both acute and chronic sinusitis. These may include pain medicines, decongestants, nasal steroid sprays, or saline sprays. However, for sinusitis related to a bacterial infection, your health care provider will prescribe antibiotic medicines. These are medicines that will help kill the bacteria causing the infection. Rarely, sinusitis is caused by a fungal infection. In these cases, your health care provider will prescribe antifungal medicine. For some cases of chronic sinusitis, surgery is needed. Generally, these are cases in which sinusitis recurs more than 3 times per year, despite other treatments. HOME CARE INSTRUCTIONS  Drink plenty of water. Water helps thin the mucus so your sinuses can drain more easily.  Use a humidifier.  Inhale steam 3-4 times a day (for example, sit in the bathroom with the shower running).  Apply a warm, moist washcloth to your face 3-4 times a day, or as directed by your health care provider.  Use saline nasal sprays to help   moisten and clean your sinuses.  Take medicines only as directed by your health care provider.  If you were prescribed either an antibiotic or antifungal medicine, finish it all even if you start to feel better. SEEK IMMEDIATE MEDICAL CARE IF:  You have  increasing pain or severe headaches.  You have nausea, vomiting, or drowsiness.  You have swelling around your face.  You have vision problems.  You have a stiff neck.  You have difficulty breathing.   This information is not intended to replace advice given to you by your health care provider. Make sure you discuss any questions you have with your health care provider.   Document Released: 06/10/2005 Document Revised: 07/01/2014 Document Reviewed: 06/25/2011 Elsevier Interactive Patient Education 2016 [employer]  Use flonase and antihistamine daily. Purchase mucinex and take to decrease nasal drip (this will help improve the sore throat) Augmentin and prednisone prescribed.

## 2015-09-12 NOTE — Progress Notes (Addendum)
Patient ID: Gentry RochShannon L Vandervelden, female   DOB: 12/31/1990, 25 y.o.   MRN: [id number]    Gentry RochShannon L Parrow , 03/18/1991, 25 y.o., female MRN: [id number]  CC: Sore throat Subjective: Pt presents for an acute OV with complaints of sore throat  of  3 days duration. Associated symptoms include fever, chills, rhinorhea, headache . Pt feels symptoms are worsening. She had similar sx last week, but thought she got better.  Pt has tried claritin and flonase to ease their symptoms.  Denies NVDR, eating and drinking well. Declined flu shot . Tdap UTD No asthma history.   Allergies  Allergen Reactions  . Fish-Derived Products     Shell fish make her sick   Social History  Substance Use Topics  . Smoking status: Never Smoker   . Smokeless tobacco: Not on file  . Alcohol Use: No   Past Medical History  Diagnosis Date  . Infectious mononucleosis 2007    ?whooping cough, too??  . Lumbosacral radiculopathy at L5 2015    [facility] at [facility]; then [facility], where she got ESI left L5-S1 11/2013 with moderate improvement.  [name]. GAD (generalized anxiety disorder)   . Pre-syncope 2016    ? POTS.  Holter showed mostly NSR, occ PACs, one run of nonsustained atrial tachy.   Past Surgical History  Procedure Laterality Date  . Wisdom tooth extraction     Family History  Problem Relation Age of Onset  . Cancer Maternal Aunt      Medication List       This list is accurate as of: 09/12/15  3:01 PM.  Always use your most recent med list.               Adapalene-Benzoyl Peroxide 0.3-2.5 % Gel  Commonly known as:  EPIDUO FORTE  Apply 1 drop topically at bedtime.     citalopram 40 MG tablet  Commonly known as:  CELEXA  Take 40 mg by mouth daily. Reported on 09/12/2015     Coconut Oil 1000 MG Caps  Take 2,000 mg by mouth 2 (two) times daily.     fluticasone 50 MCG/ACT nasal spray  Commonly known as:  FLONASE  Place into both nostrils daily.     gabapentin 300 MG capsule  Commonly known as:  NEURONTIN  2 caps po tid     HYDROcodone-acetaminophen 5-325 MG tablet  Commonly known as:  NORCO/VICODIN  Take 1-2 tablets by mouth every 6 (six) hours as needed for moderate pain.     loratadine 10 MG tablet  Commonly known as:  CLARITIN  Take 10 mg by mouth daily.     oxyCODONE-acetaminophen 5-325 MG tablet  Commonly known as:  PERCOCET/ROXICET  Take 1-2 tablets by mouth every 6 (six) hours as needed for severe pain.     traZODone 50 MG tablet  Commonly known as:  DESYREL  1-2 tabs po qhs prn insomnia         ROS: Negative, with the exception of above mentioned in HPI   Objective:  BP 107/73 mmHg  Pulse 110  Temp(Src) 99.7 F (37.6 C)  Resp 20  Wt 165 lb 12 oz (75.184 kg)  SpO2 98%  LMP 08/29/2015 Body mass index is 29.37 kg/(m^2). Gen: febrile. No acute distress. Nontoxic in appearance, well developed, well nourished, female.  HENT: AT. [place]. Bilateral TM visualized, shiny, fluid filled. MMM, no oral lesions. Bilateral nares with erythema, no swelling.  Throat without erythema or exudates. TTP max sinus. No cough or hoarseness.  Eyes:Pupils Equal Round Reactive to light, Extraocular movements intact,  Conjunctiva without redness, discharge or icterus. Neck/lymp/endocrine: Supple, left ant cervical  lymphadenopathy,  CV: tachycardic  Chest: CTAB, no wheeze or crackles. Good air movement, normal resp effort.  Abd: Soft.NTND. BS present  Skin: No  rashes, purpura or petechiae.  Neuro: Normal gait. PERLA. EOMi. Alert. Oriented x3  Assessment/Plan: MARLISSA EMERICK is a 25 y.o. female present for acute OV for  1. Acute maxillary sinusitis, recurrence not specified - Rest, hydrate, mucinex, flonase - predniSONE (DELTASONE) 50 MG tablet; Take 1 tablet (50 mg total) by mouth daily with breakfast.  Dispense: 5 tablet; Refill: 0 - amoxicillin-clavulanate (AUGMENTIN) 875-125 MG tablet; Take 1 tablet by mouth 2 (two) times daily.   Dispense: 20 tablet; Refill: 0  electronically signed by:  Felix Pacini, DO  [facility] - [place]

## 2015-10-31 NOTE — Progress Notes (Signed)
This encounter was created in error - please disregard.

## 2015-11-02 ENCOUNTER — Other Ambulatory Visit: Payer: Self-pay | Admitting: *Deleted

## 2015-11-02 NOTE — Telephone Encounter (Signed)
Pt LMOM on 11/02/15 at 1:56pm requesting refill for her hydrocodone and another medication.  Need to confirm the other medication was unable to understand vm.   Left message for pt to call back.

## 2015-11-02 NOTE — Telephone Encounter (Signed)
Spoke to pt she is also requesting refill for cyclobenzaprine (not on medication list). She stated that she is having a lot of pain in her back.   LOV: 03/03/15 NOV: None  RF request for hydrocodone Last written: 11/14/14 #120 w/ [id number]  Please advise. Thanks.

## 2015-11-03 ENCOUNTER — Other Ambulatory Visit: Payer: Self-pay | Admitting: Family Medicine

## 2015-11-03 MED ORDER — HYDROCODONE-ACETAMINOPHEN 5-325 MG PO TABS: 1.0000 | ORAL_TABLET | Freq: Four times a day (QID) | ORAL | Status: DC | PRN

## 2015-11-03 MED ORDER — CYCLOBENZAPRINE HCL 10 MG PO TABS
10.0000 mg | ORAL_TABLET | Freq: Three times a day (TID) | ORAL | Status: DC | PRN
Start: 2015-11-03 — End: 2015-11-27

## 2015-11-03 NOTE — Telephone Encounter (Signed)
Per Dr. Milinda CaveMcGowen Rx have been filled but pt will need to f/u with him or ortho. Left message for pt to call back.

## 2015-11-06 NOTE — Telephone Encounter (Signed)
Pt advised and voiced understanding.   

## 2015-11-15 ENCOUNTER — Ambulatory Visit (INDEPENDENT_AMBULATORY_CARE_PROVIDER_SITE_OTHER): Payer: 59 | Admitting: Family Medicine

## 2015-11-15 ENCOUNTER — Encounter: Payer: Self-pay | Admitting: Family Medicine

## 2015-11-15 VITALS — BP 110/74 | HR 85 | Temp 98.2°F | Resp 16 | Ht 63.0 in | Wt 169.2 lb

## 2015-11-15 DIAGNOSIS — M5442 Lumbago with sciatica, left side: Secondary | ICD-10-CM | POA: Diagnosis not present

## 2015-11-15 DIAGNOSIS — G8929 Other chronic pain: Secondary | ICD-10-CM

## 2015-11-15 DIAGNOSIS — F32A Depression, unspecified: Secondary | ICD-10-CM

## 2015-11-15 DIAGNOSIS — F411 Generalized anxiety disorder: Secondary | ICD-10-CM

## 2015-11-15 DIAGNOSIS — F329 Major depressive disorder, single episode, unspecified: Secondary | ICD-10-CM

## 2015-11-15 MED ORDER — PREDNISONE 20 MG PO TABS: ORAL_TABLET | ORAL | Status: DC

## 2015-11-15 MED ORDER — FLUOXETINE HCL 20 MG PO TABS: 20.0000 mg | ORAL_TABLET | Freq: Every day | ORAL | Status: DC

## 2015-11-15 NOTE — Progress Notes (Signed)
OFFICE VISIT  11/15/2015   CC:  Chief Complaint  Patient presents with  . Follow-up    anxiety and chronic pain     HPI:    Patient is a 25 y.o. Caucasian female who presents for f/u back pain.  I last saw her for this 03/03/15. She did not show for her initial neurology appt 03/26/16.  She has already seen specialists Dr. Retia PasseSaullo and Dr. Shelle IronBeane for her LBP and does not want to continue seeing either one of them.  She says missing here neuro consult appt was a financial thing, and she wants to get to the neurologist still.  She will try to get this referral set up through her chiropracter. She still feels pain in LB constantly, and radiating down L leg some days.  Sometimes this goes to the level of her knee anteriorly and sometimes down leg into toes.  Some tingling/numbness at times as well. Bending makes it worse.  She is frustrated/saddened by it.  She lies in bed and can't sleep. She takes gabapentin tid, flexeril prn, vicodin or percocet prn.  Says she tries to use these sparingly.  She has hx of LBP, see PMH section below.  Additionally, I did a repeat MRI L spine 03/17/15: Impression: 1. At L5-S1 there is a right paracentral disc protrusion in close proximity to the right intraspinal S1 nerve root which is slightly enlarged compared with 07/17/2013. 2. Mild broad-based disc bulge at L4-5.  She was on celexa in the past, seemed to help at first then pt associated this med with intensifying her anxiety, but then she says she is not sure.  She wants to try another antidepressant.  Says anxiety has been out of control.  Has obsessions about needing to lock doors.  She worries about everything, has depressed mood that she associates with her circumstances with her back.  She is not working, has not worked since I last saw her.  She remains married, has no children. She has still been seeing her chiropracter, dr.Taylor , in [place]. She has not done any PT: always says she cannot  afford this.  Past Medical History  Diagnosis Date  . Infectious mononucleosis 2007    ?whooping cough, too??  . Lumbosacral radiculopathy at L5 2015    Dr. Ret[facility]ull[facility] at [facility]; then [facility], where she got ESI left L5-S1 11/2013 with moderate improvement.  [name]. GAD (generalized anxiety disorder)   . Pre-syncope 2016    ? POTS.  Holter showed mostly NSR, occ PACs, one run of nonsustained atrial tachy.    Past Surgical History  Procedure Laterality Date  . Wisdom tooth extraction      Outpatient Prescriptions Prior to Visit  Medication Sig Dispense Refill  . Adapalene-Benzoyl Peroxide (EPIDUO FORTE) 0.3-2.5 % GEL Apply 1 drop topically at bedtime. 45 g 11  . citalopram (CELEXA) 40 MG tablet Take 40 mg by mouth daily. Reported on 09/12/2015  3  . Coconut Oil 1000 MG CAPS Take 2,000 mg by mouth 2 (two) times daily.    . cyclobenzaprine (FLEXERIL) 10 MG tablet Take 1 tablet (10 mg total) by mouth 3 (three) times daily as needed for muscle spasms. 60 tablet 0  . fluticasone (FLONASE) 50 MCG/ACT nasal spray Place into both nostrils daily.    [name]. gabapentin (NEURONTIN) 300 MG capsule 2 caps po tid 180 capsule 6  . HYDROcodone-acetaminophen (NORCO/VICODIN) 5-325 MG tablet Take 1-2 tablets by mouth  every 6 (six) hours as needed for moderate pain. 60 tablet 0  . loratadine (CLARITIN) 10 MG tablet Take 10 mg by mouth daily.    [name] oxyCODONE-acetaminophen (PERCOCET/ROXICET) 5-325 MG tablet Take 1-2 tablets by mouth every 6 (six) hours as needed for severe pain. 60 tablet 0  . traZODone (DESYREL) 50 MG tablet 1-2 tabs po qhs prn insomnia 60 tablet 6  . amoxicillin-clavulanate (AUGMENTIN) 875-125 MG tablet Take 1 tablet by mouth 2 (two) times daily. (Patient not taking: Reported on 11/15/2015) 20 tablet 0  . predniSONE (DELTASONE) 50 MG tablet Take 1 tablet (50 mg total) by mouth daily with breakfast. (Patient not taking: Reported on 11/15/2015) 5 tablet 0    No facility-administered medications prior to visit.    Allergies  Allergen Reactions  . Fish-Derived Products     Shell fish make her sick    ROS As per HPI  PE: Blood pressure 110/74, pulse 85, temperature 98.2 F (36.8 C), temperature source Oral, resp. rate 16, height  (1.6 m), weight 169 lb 4 oz (76.771 kg), last menstrual period 11/12/2015, SpO2 100 %. Gen: Alert, well appearing.  Patient is oriented to person, place, time, and situation. AVW:UJWJ: no injection, icteris, swelling, or exudate.  EOMI, PERRLA. Mouth: lips without lesion/swelling.  Oral mucosa pink and moist. Oropharynx without erythema, exudate, or swelling.  Neck - No masses or thyromegaly or limitation in range of motion CV: RRR, no m/r/g.   LUNGS: CTA bilat, nonlabored resps, good aeration in all lung fields. BACK: TTP in entire L spine centrally and in facet joint regions bilat, L>R.  Sacral spine diffusely TTP. Also TTP over R greater trochanter bursa. Sitting SLR elicits pain in central and L LB at 20-30 deg, and this pain gets worse as I extend her leg more but no radiation of the pain down the leg occurs. Sitting SLR on R elicits similar pain but not as intense.  DTRs: patellar and achilles trace bilat LE strength 5/5 prox/dist bilat.  LABS:  none  IMPRESSION AND PLAN:  1) Lumbosacral back pain, chronic.  Intermittent L radiculopathy symptoms. Today has some tenderness to suggest L trochanter bursitis as well. I don't think her imaging needs updating. I do think she needs to see the neurologist as she had planned and I encouraged her to reschedule her appt. In the meantime, I want to try prednisone  qd x 2d, then  qd x 2d, then 10 mg qd x 2d. Continue all other meds for her back as she is currently doing.  I did not give any new rx for narcotic pain med at today's visit.  2) GAD and depression: question of failure of citalopram. Will start trial of fluoxetine  qd.  Therapeutic  expectations and side effect profile of medication discussed today.  Patient's questions answered.  An After Visit Summary was printed and given to the patient.  FOLLOW UP: Return in about 4 weeks (around 12/13/2015) for f/u anxiety and back pain.

## 2015-11-15 NOTE — Progress Notes (Signed)
Pre visit review using our clinic review tool, if applicable. No additional management support is needed unless otherwise documented below in the visit note. 

## 2015-11-27 ENCOUNTER — Other Ambulatory Visit: Payer: Self-pay | Admitting: Family Medicine

## 2015-11-27 MED ORDER — CYCLOBENZAPRINE HCL 10 MG PO TABS: 10.0000 mg | ORAL_TABLET | Freq: Three times a day (TID) | ORAL | Status: DC | PRN

## 2015-11-27 NOTE — Telephone Encounter (Signed)
Rf request from pharmacy for cyclobenzaprine.  Pt last OV 11/15/15 Next OV 12/15/15 Last RF 11/03/15 x 0 rfs.   Please advise.

## 2015-12-15 ENCOUNTER — Ambulatory Visit: Payer: 59 | Admitting: Family Medicine

## 2015-12-25 ENCOUNTER — Telehealth: Payer: Self-pay | Admitting: *Deleted

## 2015-12-25 ENCOUNTER — Ambulatory Visit: Payer: 59 | Admitting: Family Medicine

## 2015-12-25 ENCOUNTER — Other Ambulatory Visit: Payer: Self-pay | Admitting: *Deleted

## 2015-12-25 ENCOUNTER — Encounter: Payer: Self-pay | Admitting: Family Medicine

## 2015-12-25 MED ORDER — CYCLOBENZAPRINE HCL 10 MG PO TABS: 10.0000 mg | ORAL_TABLET | Freq: Three times a day (TID) | ORAL | Status: DC | PRN

## 2015-12-25 NOTE — Telephone Encounter (Signed)
Pt LMOM on 12/25/15 at 11:24am requesting a letter from Dr. Milinda CaveMcGowen stating that she has anxiety disorder. She stated that she has social anxiety and has been summons to court for personal matters and they are requesting a letter from her PCP. Please advise. Thanks.

## 2015-12-25 NOTE — Telephone Encounter (Signed)
Left message for pt to call back  °

## 2015-12-25 NOTE — Telephone Encounter (Signed)
I printed a letter of what I am willing to say.  -thx

## 2015-12-25 NOTE — Telephone Encounter (Signed)
I will RF hx flexeril x 1.  Pt needs to f/u anxiety and depression in office in 3-4 weeks.-thx

## 2015-12-25 NOTE — Telephone Encounter (Signed)
RF request for cyclobenzaprine LOV: 11/15/15 Next ov: None Last written: 11/27/15 #60 w/ 0RF  Please advise. Thanks.

## 2015-12-27 ENCOUNTER — Other Ambulatory Visit: Payer: Self-pay | Admitting: *Deleted

## 2015-12-27 MED ORDER — FLUOXETINE HCL 20 MG PO TABS
20.0000 mg | ORAL_TABLET | Freq: Every day | ORAL | Status: DC
Start: 2015-12-27 — End: 2016-04-05

## 2015-12-27 NOTE — Telephone Encounter (Signed)
RF request for fluoxetine LOV: 11/15/15 Next ov: None Last written: 11/15/15 #30 w/ 1RF

## 2015-12-27 NOTE — Telephone Encounter (Signed)
Pt advised and voiced understanding.   

## 2015-12-28 ENCOUNTER — Other Ambulatory Visit: Payer: Self-pay | Admitting: *Deleted

## 2015-12-28 MED ORDER — GABAPENTIN 300 MG PO CAPS: ORAL_CAPSULE | ORAL | Status: DC

## 2015-12-28 MED ORDER — TRAZODONE HCL 50 MG PO TABS: ORAL_TABLET | ORAL | Status: DC

## 2015-12-28 NOTE — Telephone Encounter (Signed)
RF request for gabapentin LOV: 11/15/15 Next ov: None Last written: 04/26/15 #180 w/ 6RF  RF request for trazodone Last written: 04/18/15 #60 w/ 6Rf  Please advise. Thanks.

## 2016-01-08 ENCOUNTER — Ambulatory Visit (INDEPENDENT_AMBULATORY_CARE_PROVIDER_SITE_OTHER): Payer: 59 | Admitting: Family Medicine

## 2016-01-08 ENCOUNTER — Encounter: Payer: Self-pay | Admitting: Family Medicine

## 2016-01-08 VITALS — BP 99/73 | HR 77 | Temp 98.1°F | Resp 16 | Ht 63.0 in | Wt 161.5 lb

## 2016-01-08 DIAGNOSIS — F411 Generalized anxiety disorder: Secondary | ICD-10-CM

## 2016-01-08 DIAGNOSIS — M5416 Radiculopathy, lumbar region: Secondary | ICD-10-CM

## 2016-01-08 DIAGNOSIS — F4010 Social phobia, unspecified: Secondary | ICD-10-CM | POA: Diagnosis not present

## 2016-01-08 MED ORDER — HYDROCODONE-ACETAMINOPHEN 5-325 MG PO TABS: 1.0000 | ORAL_TABLET | Freq: Four times a day (QID) | ORAL | Status: DC | PRN

## 2016-01-08 NOTE — Progress Notes (Signed)
Pre visit review using our clinic review tool, if applicable. No additional management support is needed unless otherwise documented below in the visit note. 

## 2016-01-08 NOTE — Progress Notes (Signed)
OFFICE VISIT  01/08/2016   CC:  Chief Complaint  Patient presents with  . Follow-up   HPI:    Patient is a 25 y.o. Caucasian female who presents accompanied by her husband for 2 mo f/u anxiety d/o and chronic low back pain. Last visit I started her on fluoxetine 20mg  qd. She has only just started this med about a week ago.  Says she feels more positive already, doesn't feel as sad about the state of her painful back.  No side effects noted from the med.  No hypomania, no excessive goal-driven behavior.  Back pain, chronic lumbar radiculopathy: Has neurologist appt scheduled in [place] on 02/09/16, with the pain mgmt office there. Back not feeling well currently, has tingling in left knee area.   She takes naproxen in mornings.  Takes a pain pill hs to help her sleep (vicodin)--this is her usual pattern.  She says she reserves oxycodone use for "like when I get a pinched nerve".   Last time she was here I gave her some systemic steroids for her back and this helped some but not much.  Past Medical History  Diagnosis Date  . Infectious mononucleosis 2007    ?whooping cough, too??  . Lumbosacral radiculopathy at L5 2015    Dr. [facility]ull[facility] at [facility]; then [facility], where she got ESI left L5-S1 11/2013 with moderate improvement.  [name]. GAD (generalized anxiety disorder)   . Pre-syncope 2016    ? POTS.  Holter showed mostly NSR, occ PACs, one run of nonsustained atrial tachy.    Past Surgical History  Procedure Laterality Date  . Wisdom tooth extraction      Outpatient Prescriptions Prior to Visit  Medication Sig Dispense Refill  . Adapalene-Benzoyl Peroxide (EPIDUO FORTE) 0.3-2.5 % GEL Apply 1 drop topically at bedtime. 45 g 11  . cyclobenzaprine (FLEXERIL) 10 MG tablet Take 1 tablet (10 mg total) by mouth 3 (three) times daily as needed for muscle spasms. 60 tablet 0  . FLUoxetine (PROZAC) 20 MG tablet Take 1 tablet (20 mg total) by mouth  daily. 30 tablet 11  . fluticasone (FLONASE) 50 MCG/ACT nasal spray Place into both nostrils daily.    [name]. gabapentin (NEURONTIN) 300 MG capsule 2 caps po tid 180 capsule 6  . loratadine (CLARITIN) 10 MG tablet Take 10 mg by mouth daily.    [name]. oxyCODONE-acetaminophen (PERCOCET/ROXICET) 5-325 MG tablet Take 1-2 tablets by mouth every 6 (six) hours as needed for severe pain. 60 tablet 0  . traZODone (DESYREL) 50 MG tablet 1-2 tabs po qhs prn insomnia 60 tablet 6  . HYDROcodone-acetaminophen (NORCO/VICODIN) 5-325 MG tablet Take 1-2 tablets by mouth every 6 (six) hours as needed for moderate pain. 60 tablet 0  . citalopram (CELEXA) 40 MG tablet Take 40 mg by mouth daily. Reported on 01/08/2016  3  . Coconut Oil 1000 MG CAPS Take 2,000 mg by mouth 2 (two) times daily. Reported on 01/08/2016    . predniSONE (DELTASONE) 20 MG tablet 2 tabs po qd x 4d, then 1 tab po qd x 4d, then 1/2 tab po qd x 4d (Patient not taking: Reported on 01/08/2016) 14 tablet 0   No facility-administered medications prior to visit.    Allergies  Allergen Reactions  . Fish-Derived Products     Shell fish make her sick    ROS As per HPI  PE: Blood pressure 99/73, pulse 77, temperature 98.1 F (36.7 C), temperature  source Oral, resp. rate 16, height  (1.6 m), weight 161 lb 8 oz (73.256 kg), last menstrual period 12/22/2015, SpO2 97 %. Wt Readings from Last 2 Encounters:  01/08/16 161 lb 8 oz (73.256 kg)  11/15/15 169 lb 4 oz (76.771 kg)    Gen: alert, oriented x 4, affect pleasant.  Lucid thinking and conversation noted. HEENT: PERRLA, EOMI.   Neck: no LAD, mass, or thyromegaly. CV: RRR, no m/r/g LUNGS: CTA bilat, nonlabored. NEURO: no tremor or tics noted on observation.  Coordination intact. CN 2-12 grossly intact bilaterally, strength 5/5 in all extremeties.  No ataxia. BACK: entire lumbar spine with tenderness to palpation, esp L lower. Mild L greater troch TTP.  LE strength is intact--just some suggestion of  L leg weakness because using it causes her excessive LBP.  DTRs: 1+ achilles and patellar bilat.  LABS:  None today  IMPRESSION AND PLAN:  1) GAD, with social anxiety disorder as well as some depression. Her early improvement on fluoxetine is likely placebo effect.  Will continue her on this dosing and recheck her in 2 mo.  2) Chronic lumbar radiculopathy: she is set to see her third specialist for further evaluation of this problem: a neurologist (pt could not recall the name) at [facility] mg[facility] clinic on 02/09/16. I printed Vicodin 5/325, 1-2 q6h prn, #60 today and handed this rx to the patient.  An After Visit Summary was printed and given to the patient.  FOLLOW UP: Return in about 2 months (around 03/10/2016) for follow up anxiety/depression.  Signed:  Santiago Bumpers, MD           01/08/2016

## 2016-01-15 ENCOUNTER — Other Ambulatory Visit: Payer: Self-pay | Admitting: *Deleted

## 2016-01-15 MED ORDER — CYCLOBENZAPRINE HCL 10 MG PO TABS: 10.0000 mg | ORAL_TABLET | Freq: Three times a day (TID) | ORAL | 1 refills | Status: DC | PRN

## 2016-01-15 NOTE — Telephone Encounter (Signed)
RF request for cyclodenzaprine LOV: 01/08/16 Next ov: 03/11/16 Last written: 12/25/15 #60 w/ 1WE  Please advise. Thanks.

## 2016-01-25 IMAGING — MR MR LUMBAR SPINE W/O CM
4 of 5 series · 15 of 48 positions shown · non-contrast
Comparison: 07/17/2013

CLINICAL DATA: Low back pain extending bilaterally through the
legs.

EXAM:
MRI LUMBAR SPINE WITHOUT CONTRAST
TECHNIQUE: Multiplanar, multisequence MR imaging of the lumbar spine was
performed. No intravenous contrast was administered.

[Series 3: T2 · sagittal · 4.0mm · 0.67mm/px · 6 of 15 slices shown (1 of 2)]
[im 1/15]
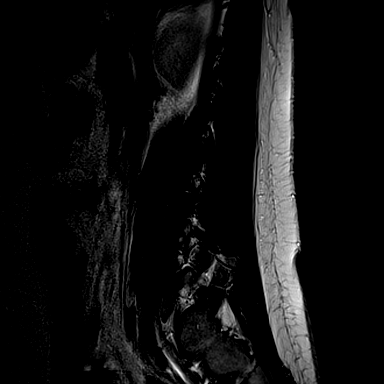
[im 3/15]
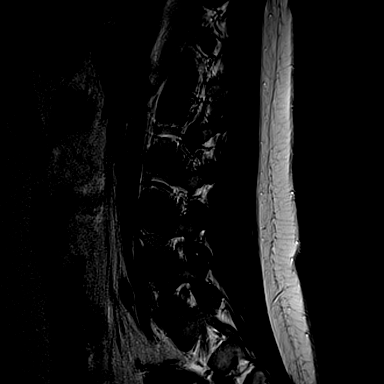
[im 6/15]
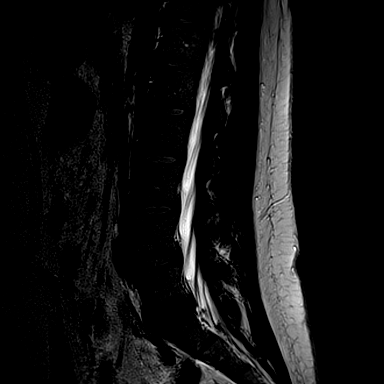
[im 9/15]
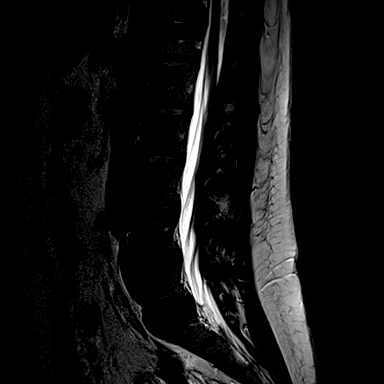
[im 12/15]
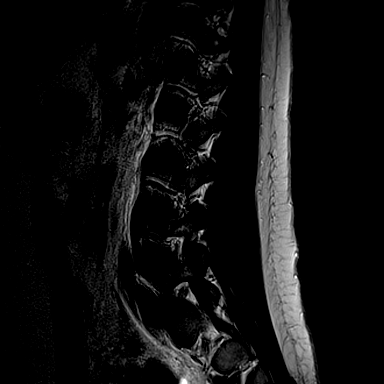
[im 15/15]
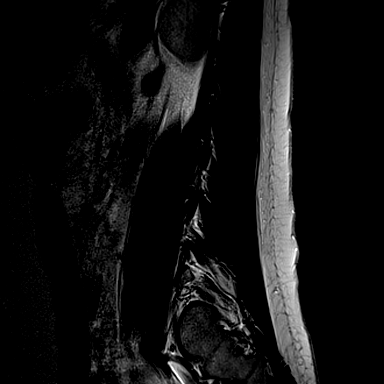

[Series 4: T1 · sagittal · 4.0mm · 0.34mm/px · 3 of 15 slices shown (1 of 2)]
[im 3/15]
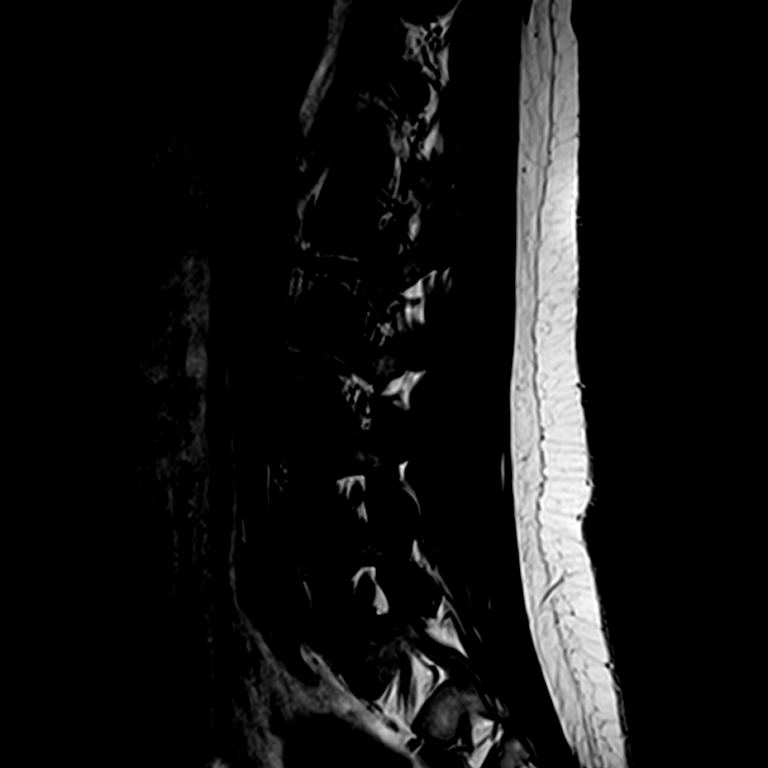
[im 9/15]
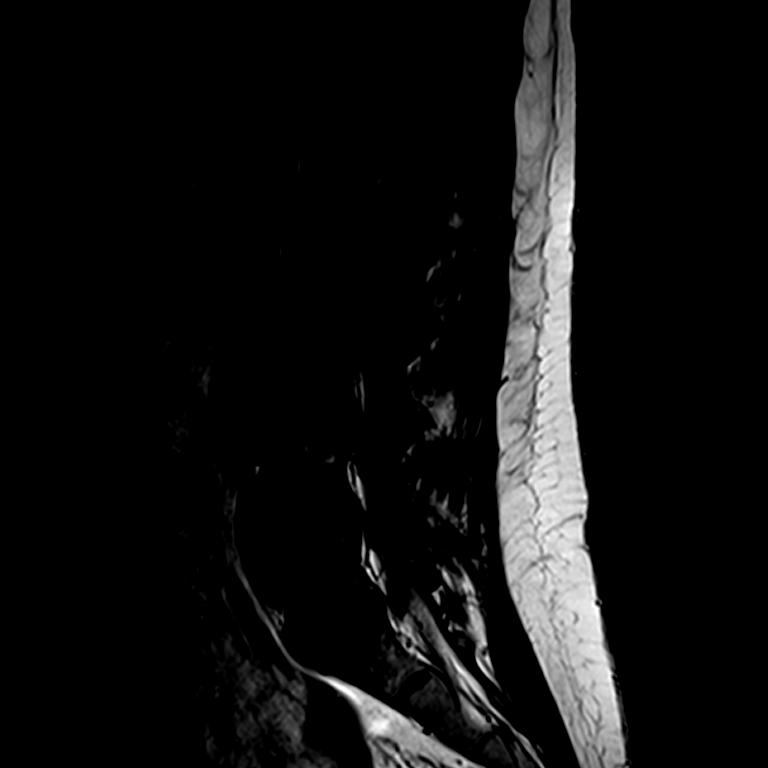
[im 15/15]
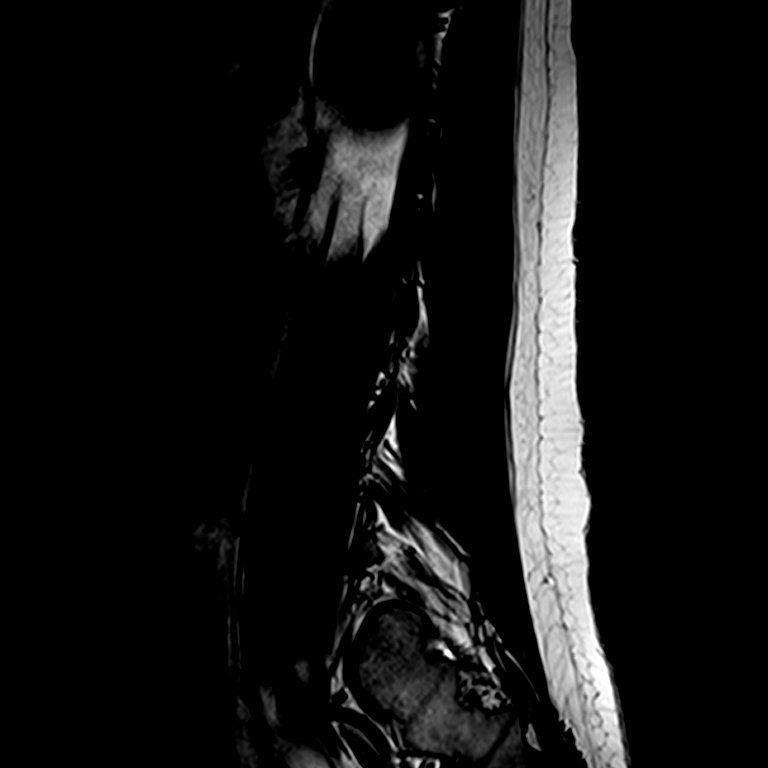

[Series 6: T2 · axial · 4.0mm · 0.21mm/px · z∈[-97,+32]mm · 3 of 37 slices shown (2 of 2)]
[im 6/37]
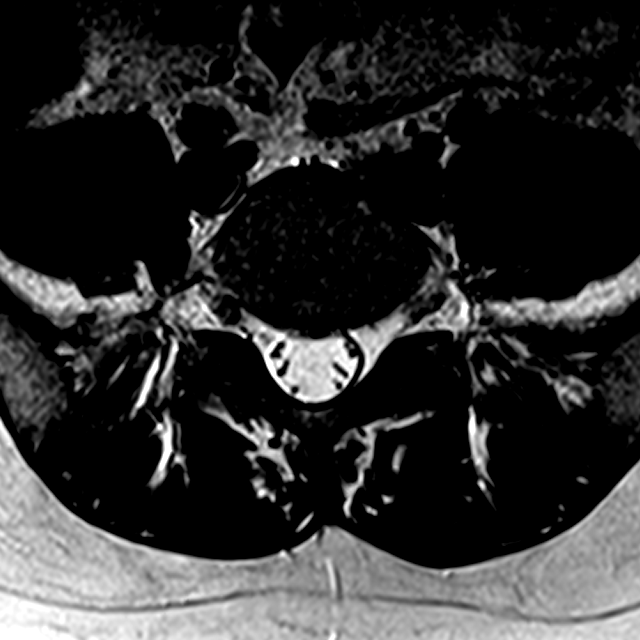
[im 19/37]
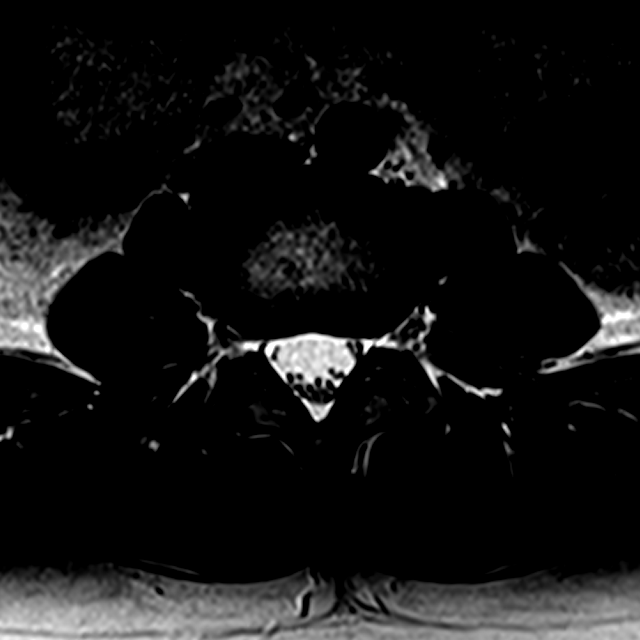
[im 31/37]
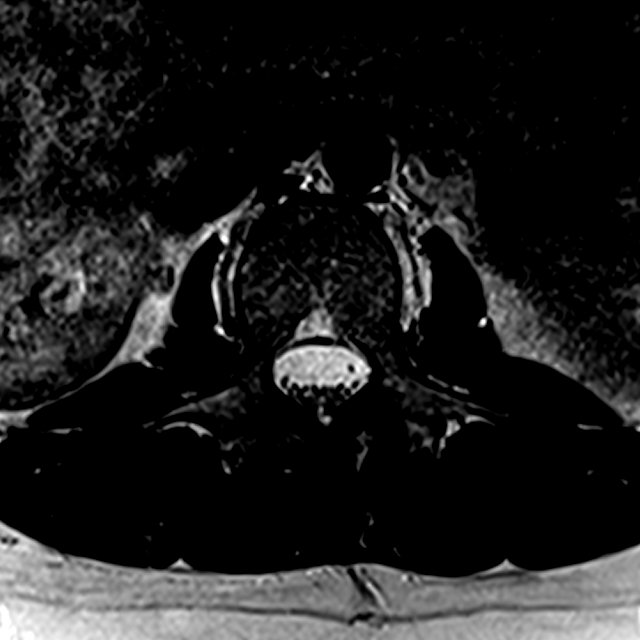

[Series 7: T1 · axial · 4.0mm · 0.21mm/px · z∈[-96,+31]mm · 3 of 37 slices shown (2 of 2)]
[im 6/37]
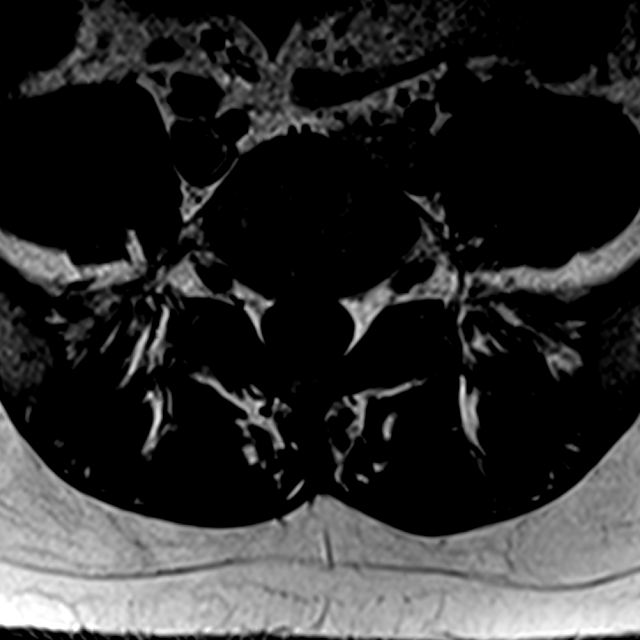
[im 19/37]
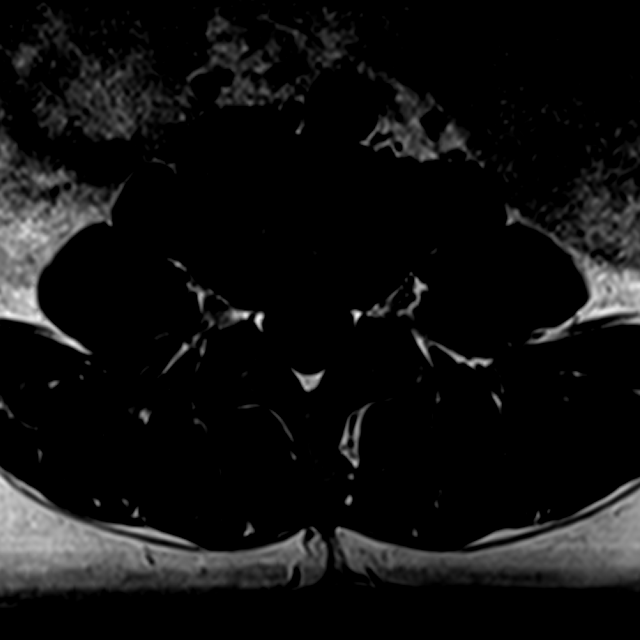
[im 31/37]
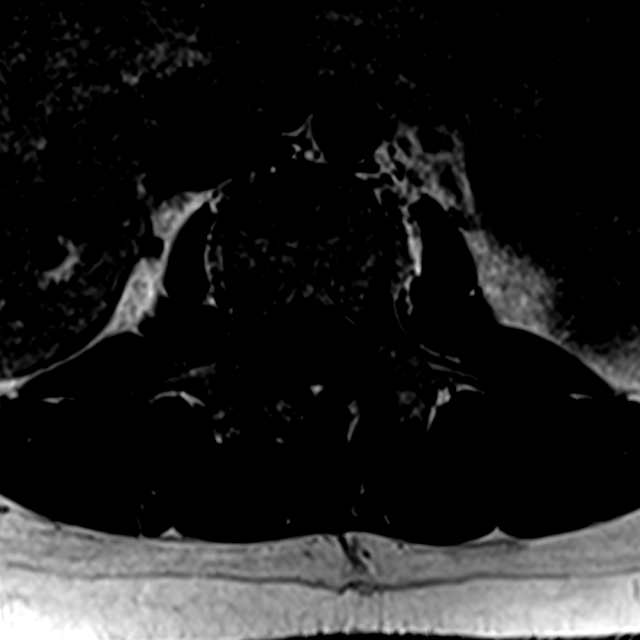

[15 of 48 positions shown; findings below may reference images not displayed]

FINDINGS: The vertebral bodies of the lumbar spine are normal in size. The
vertebral bodies of the lumbar spine are normal in alignment. There
is normal bone marrow signal demonstrated throughout the vertebra.
The intervertebral disc spaces are well-maintained.

The spinal cord is normal in signal and contour. The cord terminates
normally at L1 . The nerve roots of the cauda equina and the filum
terminale are normal.

The visualized portions of the SI joints are unremarkable.

The imaged intra-abdominal contents are unremarkable.

T12-L1: No significant disc bulge. No evidence of neural foraminal
stenosis. No central canal stenosis.

L1-L2: No significant disc bulge. No evidence of neural foraminal
stenosis. No central canal stenosis.

L2-L3: No significant disc bulge. No evidence of neural foraminal
stenosis. No central canal stenosis.

L3-L4: No significant disc bulge. No evidence of neural foraminal
stenosis. No central canal stenosis.

L4-L5: Mild broad-based disc bulge. No evidence of neural foraminal
stenosis. No central canal stenosis.

L5-S1: Right paracentral disc protrusion in close proximity to the
right at intraspinal S1 nerve root. No evidence of neural foraminal
stenosis. No central canal stenosis.
IMPRESSION: 1. At L5-S1 there is a right paracentral disc protrusion in close
proximity to the right intraspinal S1 nerve root which is slightly
enlarged compared with 07/17/2013.
2. Mild broad-based disc bulge at L4-5.

## 2016-03-05 ENCOUNTER — Other Ambulatory Visit: Payer: Self-pay | Admitting: Family Medicine

## 2016-03-05 MED ORDER — HYDROCODONE-ACETAMINOPHEN 5-325 MG PO TABS: 1.0000 | ORAL_TABLET | Freq: Four times a day (QID) | ORAL | 0 refills | Status: DC | PRN

## 2016-03-05 NOTE — Telephone Encounter (Signed)
Patient requesting rf of hydrocodone.   Last Rx printed 01/08/16 # 60 Last OV 01/08/16. Next OV 03/11/16. Please advise.

## 2016-03-06 ENCOUNTER — Other Ambulatory Visit: Payer: Self-pay

## 2016-03-06 NOTE — Telephone Encounter (Signed)
Left message of RX at front desk for p/u.

## 2016-03-06 NOTE — Telephone Encounter (Signed)
Refill request received from pharmacy, previously filled 12/27/15 with 11 refills. Refaxed confirmation to Pharmacy.

## 2016-03-07 ENCOUNTER — Other Ambulatory Visit: Payer: Self-pay | Admitting: Family Medicine

## 2016-03-11 ENCOUNTER — Ambulatory Visit (INDEPENDENT_AMBULATORY_CARE_PROVIDER_SITE_OTHER): Payer: 59 | Admitting: Family Medicine

## 2016-03-11 ENCOUNTER — Encounter: Payer: Self-pay | Admitting: Family Medicine

## 2016-03-11 VITALS — BP 110/75 | HR 95 | Temp 97.9°F | Resp 18 | Wt 161.8 lb

## 2016-03-11 DIAGNOSIS — F418 Other specified anxiety disorders: Secondary | ICD-10-CM | POA: Diagnosis not present

## 2016-03-11 DIAGNOSIS — M545 Low back pain: Secondary | ICD-10-CM

## 2016-03-11 DIAGNOSIS — F32A Depression, unspecified: Secondary | ICD-10-CM

## 2016-03-11 DIAGNOSIS — G8929 Other chronic pain: Secondary | ICD-10-CM

## 2016-03-11 DIAGNOSIS — F419 Anxiety disorder, unspecified: Principal | ICD-10-CM

## 2016-03-11 DIAGNOSIS — F329 Major depressive disorder, single episode, unspecified: Secondary | ICD-10-CM

## 2016-03-11 NOTE — Progress Notes (Signed)
OFFICE VISIT  03/11/2016   CC:  Chief Complaint  Patient presents with  . Follow-up    anxiety and depression   HPI:    Patient is a 25 y.o. Caucasian female who presents for 2 mo f/u anxiety and depression. Has been on 20mg  fluoxetine for a little over 2 months now. Overall improved, mood/anxiety okay.  Says sleep is still broken/tosses and turns a lot. Concentration/focus is fine.  Appetite fine. Denies any side effects.   Lots of her life stress centers around her sister and nephew and his psychosocial stressors/troubles. She also complains of having tendency to be "OCD", describes some "checking" behavior. Also says she cannot drive by herself due to her anxiety. She asks for referral to a psychiatrist today. She wants to keep her fluoxetine dose the same.  Saw Dr. Channing Mutters with neurosurgery about her low back.  Surgery not recommended.  He set her up with PT in [place], which she starts in 4d.  He emphasized keeping wt down.   Takes pain pill every evening.  Ibup/naprox daytime.  Flexeril once daily.   Past Medical History:  Diagnosis Date  . GAD (generalized anxiety disorder)   . Infectious mononucleosis 2007   ?whooping cough, too??  . Lumbosacral radiculopathy at L5 2015   [facility]; then 2nd opinion with Dr. Shelle Iron at [facility], where she got ESI left L5-S1 11/2013 with moderate improvement.  . Pre-syncope 2016   ? POTS.  Holter showed mostly NSR, occ PACs, one run of nonsustained atrial tachy.    Past Surgical History:  Procedure Laterality Date  . WISDOM TOOTH EXTRACTION      Outpatient Medications Prior to Visit  Medication Sig Dispense Refill  . Adapalene-Benzoyl Peroxide (EPIDUO FORTE) 0.3-2.5 % GEL Apply 1 drop topically at bedtime. 45 g 11  . cyclobenzaprine (FLEXERIL) 10 MG tablet TAKE 1 TABLET (10 MG TOTAL) BY MOUTH 3 (THREE) TIMES DAILY AS NEEDED FOR MUSCLE SPASMS. 60 tablet 1  . FLUoxetine (PROZAC) 20 MG tablet Take 1 tablet  (20 mg total) by mouth daily. 30 tablet 11  . fluticasone (FLONASE) 50 MCG/ACT nasal spray Place into both nostrils daily.    [name] gabapentin (NEURONTIN) 300 MG capsule 2 caps po tid 180 capsule 6  . HYDROcodone-acetaminophen (NORCO/VICODIN) 5-325 MG tablet Take 1-2 tablets by mouth every 6 (six) hours as needed for moderate pain. 60 tablet 0  . loratadine (CLARITIN) 10 MG tablet Take 10 mg by mouth daily.    [name] oxyCODONE-acetaminophen (PERCOCET/ROXICET) 5-325 MG tablet Take 1-2 tablets by mouth every 6 (six) hours as needed for severe pain. 60 tablet 0  . traZODone (DESYREL) 50 MG tablet 1-2 tabs po qhs prn insomnia 60 tablet 6   No facility-administered medications prior to visit.     Allergies  Allergen Reactions  . Fish-Derived Products     Shell fish make her sick    ROS As per HPI  PE: Blood pressure 110/75, pulse 95, temperature 97.9 F (36.6 C), temperature source Oral, resp. rate 18, weight 161 lb 12.8 oz (73.4 kg), last menstrual period 03/04/2016, SpO2 98 %. Gen: Alert, well appearing.  Patient is oriented to person, place, time, and situation. AFFECT: pleasant, lucid thought and speech. No further exam today.  LABS:  none  IMPRESSION AND PLAN:  1) Anxiety and depression: improved but pt wants to see psychiatrist. Jovita Gamma pt contact info for [facility] in [place] and [place]. Continue fluoxetine 20mg  qd.  2) Chronic  low back pain: nothing surgical at this time per neurosurgery. Starting PT  at the end of this week.  Uses vicodin sparingly: usually 1-2 per night. Continue NSAIDs prn daytime, plus flexeril once daily and gabapentin 600mg  tid.  An After Visit Summary was printed and given to the patient.  FOLLOW UP: Return in about 3 months (around 06/10/2016) for f/u low back pain.  Signed:  Santiago BumpersPhil Saylah Ketner, MD           03/11/2016

## 2016-03-11 NOTE — Patient Instructions (Addendum)
[facility] in [place]: [phone] Or [phone].  OR Behavioral health in [place]:  [phone]

## 2016-03-11 NOTE — Progress Notes (Signed)
Pre visit review using our clinic review tool, if applicable. No additional management support is needed unless otherwise documented below in the visit note. 

## 2016-03-13 ENCOUNTER — Telehealth: Payer: Self-pay | Admitting: Family Medicine

## 2016-03-13 NOTE — Telephone Encounter (Signed)
I did enter a referral to psychiatry on 03/11/16.  Is there a different referral I need to do?

## 2016-03-13 NOTE — Telephone Encounter (Signed)
Patient states she contacted [facility] in [place] as instructed by PCP at ov this week. She was informed by their office they needed additional information from PCP before an appt could be scheduled.   I contacted the office and was advised an referral needed to be entered in Epic and routed to [facility]).

## 2016-03-26 ENCOUNTER — Telehealth (HOSPITAL_COMMUNITY): Payer: Self-pay | Admitting: *Deleted

## 2016-03-26 NOTE — Telephone Encounter (Signed)
left voice message regarding an appointment. 

## 2016-04-05 ENCOUNTER — Encounter (HOSPITAL_COMMUNITY): Payer: Self-pay | Admitting: Psychiatry

## 2016-04-05 ENCOUNTER — Telehealth: Payer: Self-pay | Admitting: Family Medicine

## 2016-04-05 ENCOUNTER — Ambulatory Visit (INDEPENDENT_AMBULATORY_CARE_PROVIDER_SITE_OTHER): Payer: 59 | Admitting: Psychiatry

## 2016-04-05 VITALS — BP 113/73 | HR 90 | Ht 63.0 in | Wt 163.0 lb

## 2016-04-05 DIAGNOSIS — Z811 Family history of alcohol abuse and dependence: Secondary | ICD-10-CM | POA: Diagnosis not present

## 2016-04-05 DIAGNOSIS — Z818 Family history of other mental and behavioral disorders: Secondary | ICD-10-CM | POA: Diagnosis not present

## 2016-04-05 DIAGNOSIS — F411 Generalized anxiety disorder: Secondary | ICD-10-CM | POA: Diagnosis not present

## 2016-04-05 DIAGNOSIS — F321 Major depressive disorder, single episode, moderate: Secondary | ICD-10-CM

## 2016-04-05 DIAGNOSIS — Z79899 Other long term (current) drug therapy: Secondary | ICD-10-CM

## 2016-04-05 MED ORDER — LORAZEPAM 0.5 MG PO TABS: 0.5000 mg | ORAL_TABLET | Freq: Three times a day (TID) | ORAL | 0 refills | Status: AC | PRN

## 2016-04-05 MED ORDER — FLUOXETINE HCL 20 MG PO TABS: 40.0000 mg | ORAL_TABLET | Freq: Every day | ORAL | 0 refills | Status: DC
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
## 2016-04-05 NOTE — Patient Instructions (Addendum)
1. Increase fluoxetine 40 mg daily 2. Start Ativan 0.5 mg three times a day as needed for anxiety 3. You will be referred to a therapist 4. Return to clinic in one month

## 2016-04-05 NOTE — Telephone Encounter (Signed)
Noted  

## 2016-04-05 NOTE — Telephone Encounter (Signed)
Pt Susan Chandler stating that the Psych Dr you sent her to wants her to begin Prozac 40 mg in stead of 20mg .   Pt wants to make sure you are okay with this change?  Can we send new RX?  Please advise.

## 2016-04-05 NOTE — Telephone Encounter (Signed)
OK with me to increase prozac to 40mg . As far as sending in a new rx, I would do this but she was seen by an MD and he indicated in his note the plan to increase the prozac to 40mg , so I assume he would do the prescribing.  Did Susan Chandler actually say she needs a rx? Does she have plans to f/u with the psychiatrist?

## 2016-04-05 NOTE — Telephone Encounter (Signed)
Spoke to patient and she states that she picked up the Rx and it was still the 20mg  prozac that was maybe an autofill from you.  I advised patient to contact pharmacy and tell them that her psych MD sent in a 40mg  Rx and that is the one that was to be filled.   Patient expressed understanding.

## 2016-04-06 ENCOUNTER — Encounter: Payer: Self-pay | Admitting: Family Medicine

## 2016-04-10 ENCOUNTER — Other Ambulatory Visit: Payer: Self-pay

## 2016-04-10 MED ORDER — FLUOXETINE HCL 20 MG PO TABS: 40.0000 mg | ORAL_TABLET | Freq: Every day | ORAL | 0 refills | Status: DC

## 2016-04-23 ENCOUNTER — Telehealth: Payer: Self-pay

## 2016-04-23 NOTE — Telephone Encounter (Signed)
Patient called inquiring about prescription for Prozac 40 mg.  Left message for patient to return call.

## 2016-04-29 ENCOUNTER — Ambulatory Visit (HOSPITAL_COMMUNITY): Payer: Self-pay | Admitting: Psychiatry

## 2016-05-03 ENCOUNTER — Ambulatory Visit (HOSPITAL_COMMUNITY): Payer: Self-pay | Admitting: Psychiatry

## 2016-05-06 ENCOUNTER — Encounter: Payer: Self-pay | Admitting: Family Medicine

## 2016-06-10 ENCOUNTER — Encounter: Payer: Self-pay | Admitting: Family Medicine

## 2016-06-10 ENCOUNTER — Ambulatory Visit (INDEPENDENT_AMBULATORY_CARE_PROVIDER_SITE_OTHER): Payer: 59 | Admitting: Family Medicine

## 2016-06-10 ENCOUNTER — Encounter: Payer: Self-pay | Admitting: *Deleted

## 2016-06-10 VITALS — BP 110/80 | HR 97 | Temp 98.6°F | Resp 16 | Ht 63.0 in | Wt 157.2 lb

## 2016-06-10 DIAGNOSIS — G8929 Other chronic pain: Secondary | ICD-10-CM

## 2016-06-10 DIAGNOSIS — M5442 Lumbago with sciatica, left side: Secondary | ICD-10-CM | POA: Diagnosis not present

## 2016-06-10 MED ORDER — HYDROCODONE-ACETAMINOPHEN 5-325 MG PO TABS: 1.0000 | ORAL_TABLET | Freq: Four times a day (QID) | ORAL | 0 refills | Status: DC | PRN

## 2016-06-10 NOTE — Progress Notes (Signed)
OFFICE VISIT  06/10/2016   CC:  Chief Complaint  Patient presents with  . Follow-up    [facility]CI,    HPI:    Patient is a 25 y.o. Caucasian female who presents for 3 mo f/u chronic low back pain. Pain is miserable today, L LB radiating down lateral aspect of left thigh to L knee.  She has been doing PT and has seen chiropracter.  She had f/u appt with Dr. Channing Muttersoy but she had to get rescheduled. Typically uses the vicodin only hs to help get comfortable and to get to sleep.  She has not taken the gabapentin b/c of fear about side effects she heard on web site/news. She has not taken oxycodone in about a week.   03/2016 she saw psychiatrist and her fluoxetine was increased to 40mg  and started ativan 0.5mg  tid and trazodone 50mg  qhs prn insomnia.  She was referred to a counselor/CBT. She has not taken the ativan yet, per pt.      Past Medical History:  Diagnosis Date  . GAD (generalized anxiety disorder)   . Infectious mononucleosis 2007   ?whooping cough, too??  . Lumbosacral radiculopathy at L5 2015   [facility]ullo [facility]; then [facility]nd [facility], where she got ESI left L5-S1 11/2013 with moderate improvement.  Saw neurosurgeon 01/2016: surgery NOT recommended.  PT initiated.  . MDD (major depressive disorder)    r/o OCD per psychiatrist 03/2016.  Also, psychologist eval revealed suspected Bipolar II, depressed phase 04/16/2016.  . Pre-syncope 2016   ? POTS.  Holter showed mostly NSR, occ PACs, one run of nonsustained atrial tachy.    Past Surgical History:  Procedure Laterality Date  . WISDOM TOOTH EXTRACTION      Outpatient Medications Prior to Visit  Medication Sig Dispense Refill  . Adapalene-Benzoyl Peroxide (EPIDUO FORTE) 0.3-2.5 % GEL Apply 1 drop topically at bedtime. 45 g 11  . cyclobenzaprine (FLEXERIL) 10 MG tablet TAKE 1 TABLET (10 MG TOTAL) BY MOUTH 3 (THREE) TIMES DAILY AS NEEDED FOR MUSCLE SPASMS. 60 tablet 1  . FLUoxetine  (PROZAC) 20 MG tablet Take 2 tablets (40 mg total) by mouth daily. 180 tablet 0  . fluticasone (FLONASE) 50 MCG/ACT nasal spray Place into both nostrils daily as needed.     . loratadine (CLARITIN) 10 MG tablet Take 10 mg by mouth daily as needed.     [name]. oxyCODONE-acetaminophen (PERCOCET/ROXICET) 5-325 MG tablet Take 1-2 tablets by mouth every 6 (six) hours as needed for severe pain. 60 tablet 0  . traZODone (DESYREL) 50 MG tablet 1-2 tabs po qhs prn insomnia 60 tablet 6  . HYDROcodone-acetaminophen (NORCO/VICODIN) 5-325 MG tablet Take 1-2 tablets by mouth every 6 (six) hours as needed for moderate pain. 60 tablet 0  . gabapentin (NEURONTIN) 300 MG capsule 2 caps po tid (Patient not taking: Reported on 06/10/2016) 180 capsule 6   No facility-administered medications prior to visit.     Allergies  Allergen Reactions  . Fish-Derived Products     Shell fish make her sick    ROS As per HPI  PE: Blood pressure 110/80, pulse 97, temperature 98.6 F (37 C), temperature source Oral, resp. rate 16, height 5\' 3"  (1.6 m), weight 157 lb 4 oz (71.3 kg), last menstrual period 06/06/2016, SpO2 97 %. Gen: Alert, well appearing.  Patient is oriented to person, place, time, and situation. AFFECT: pleasant, lucid thought and speech. L spine: ROM intact but has  pain at 90 deg of flexion.  TTP L LB.   Sitting SLR: R side elicits LLB pain at full extension.  L side at 70 deg elicits LLB pain that radiates down L glut/thigh Lower extremity strength: 5/5 prox/dist bilat. Patellar DTR 1+ bilat.  LABS:  None today  IMPRESSION AND PLAN:  Chronic lumbar radiculopathy:  Pretty stable, although she is having a bad day today. Continue PT, restart gabapentin, continue NSAID use in daytime for pain, and continue saving vicodin 5/325 (or percocet 5/325 for very severe pain) for hs use to allow more comfort to be able to sleep.  We discussed and reviewed pain contract today.  This will be in chart. I gave rx for  vicodine 5/325, 1-2 q6h prn, #60.  No percocet rx given today.  An After Visit Summary was printed and given to the patient.  FOLLOW UP: Return in about 3 months (around 09/08/2016) for f/u low back pain.  Signed:  Santiago BumpersPhil Junko Ohagan, MD           06/10/2016

## 2016-06-10 NOTE — Progress Notes (Signed)
Pre visit review using our clinic review tool, if applicable. No additional management support is needed unless otherwise documented below in the visit note. 

## 2016-07-08 DIAGNOSIS — G44219 Episodic tension-type headache, not intractable: Secondary | ICD-10-CM | POA: Diagnosis not present

## 2016-07-08 DIAGNOSIS — M9901 Segmental and somatic dysfunction of cervical region: Secondary | ICD-10-CM | POA: Diagnosis not present

## 2016-07-08 DIAGNOSIS — M9903 Segmental and somatic dysfunction of lumbar region: Secondary | ICD-10-CM | POA: Diagnosis not present

## 2016-08-23 ENCOUNTER — Telehealth: Payer: Self-pay | Admitting: Family Medicine

## 2016-08-23 ENCOUNTER — Other Ambulatory Visit: Payer: Self-pay | Admitting: Family Medicine

## 2016-08-23 DIAGNOSIS — M9901 Segmental and somatic dysfunction of cervical region: Secondary | ICD-10-CM | POA: Diagnosis not present

## 2016-08-23 DIAGNOSIS — M9903 Segmental and somatic dysfunction of lumbar region: Secondary | ICD-10-CM | POA: Diagnosis not present

## 2016-08-23 DIAGNOSIS — G44219 Episodic tension-type headache, not intractable: Secondary | ICD-10-CM | POA: Diagnosis not present

## 2016-08-23 NOTE — Telephone Encounter (Signed)
**  Remind patient they can make refill requests via MyChart**  Medication refill request (Name & Dosage): FLUoxetine (PROZAC) 20 MG tablet    Preferred pharmacy (Name & [id number] [place], [place] - [address]     Other comments (if applicable):

## 2016-08-23 NOTE — Telephone Encounter (Signed)
[facility] [place]  RF request for fluoxetine LOV: 03/11/16 Next ov: 09/06/16 Last written: 04/10/16 #180 w/ 0RF

## 2016-08-23 NOTE — Telephone Encounter (Signed)
[facility] FLU[facility]etine (PROZAC) 20 MG tablet [[id number]][[id number]]  Again. She states that she accidentally deleted the script and does not have it now

## 2016-08-26 ENCOUNTER — Telehealth: Payer: Self-pay | Admitting: Family Medicine

## 2016-08-26 MED ORDER — FLUOXETINE HCL 20 MG PO CAPS: 40.0000 mg | ORAL_CAPSULE | Freq: Every day | ORAL | 1 refills | Status: DC

## 2016-08-26 NOTE — Addendum Note (Signed)
Addended by: Smitty KnudsenSUTHERLAND, HEATHER K on: 08/26/2016 09:19 AM   Modules accepted: Orders

## 2016-08-26 NOTE — Telephone Encounter (Signed)
See previous message

## 2016-08-26 NOTE — Telephone Encounter (Signed)
Please resend rx for FLUoxetine (PROZAC) 20 MG tablet to  [id number] [place], [place] - [address] [phone] (Phone) [phone])   The pharmacy accidentally deleted it and needs another hard copy. Please call the pharmacy with any questions.

## 2016-08-26 NOTE — Telephone Encounter (Signed)
SW pharmacy and gave verbal okay to fill as directed below. Rx was changed from tab to cap due to cost.

## 2016-09-06 ENCOUNTER — Ambulatory Visit: Payer: Self-pay | Admitting: Family Medicine

## 2016-09-24 ENCOUNTER — Other Ambulatory Visit: Payer: Self-pay | Admitting: Family Medicine

## 2016-09-24 MED ORDER — HYDROCODONE-ACETAMINOPHEN 5-325 MG PO TABS: 1.0000 | ORAL_TABLET | Freq: Four times a day (QID) | ORAL | 0 refills | Status: DC | PRN

## 2016-09-24 NOTE — Telephone Encounter (Signed)
Pt requesting hydrocodone refill, pt is waiting to see what her husband's schedule looks like before she makes her next appt with Dr. Milinda Cave, please advise

## 2016-09-24 NOTE — Telephone Encounter (Signed)
RF request for hydro/apap LOV: 06/10/16 Next ov: None Last written: 06/10/16 #60 w/ 1RF  Please advise. Thanks.

## 2016-09-25 NOTE — Telephone Encounter (Signed)
Rx put up front for p/u. Pt advised and voiced understanding.   

## 2016-11-04 ENCOUNTER — Other Ambulatory Visit: Payer: Self-pay | Admitting: *Deleted

## 2016-11-04 MED ORDER — TRAZODONE HCL 50 MG PO TABS: ORAL_TABLET | ORAL | 6 refills | Status: DC

## 2016-11-04 NOTE — Telephone Encounter (Signed)
[facility].  RF request for trazodone LOV: 06/10/16 Next ov: None Last written: 12/28/15 #60 w/ 6RF  Please advise. Thanks.

## 2016-11-12 DIAGNOSIS — M9903 Segmental and somatic dysfunction of lumbar region: Secondary | ICD-10-CM | POA: Diagnosis not present

## 2016-11-12 DIAGNOSIS — M9901 Segmental and somatic dysfunction of cervical region: Secondary | ICD-10-CM | POA: Diagnosis not present

## 2016-11-12 DIAGNOSIS — M5442 Lumbago with sciatica, left side: Secondary | ICD-10-CM | POA: Diagnosis not present

## 2016-11-29 ENCOUNTER — Other Ambulatory Visit: Payer: Self-pay | Admitting: *Deleted

## 2016-11-29 MED ORDER — TRAZODONE HCL 50 MG PO TABS: ORAL_TABLET | ORAL | 1 refills | Status: DC

## 2016-11-29 NOTE — Telephone Encounter (Signed)
[facility] requesting 90 day supply.  RF request for trazodone LOV: 06/10/16 Next ov: None Last written: 11/04/16 #60 w/ 6RF  Please advise. Thanks.

## 2016-11-29 NOTE — Telephone Encounter (Signed)
I authorized RF of trazodone with 1 additional RF. Pt needs to make appt for f/u pain and insomnia (30 min) in 1-2 months.-thx

## 2016-12-02 NOTE — Telephone Encounter (Signed)
Left message for pt to call back  °

## 2016-12-03 MED ORDER — TRAZODONE HCL 50 MG PO TABS: ORAL_TABLET | ORAL | 1 refills | Status: DC

## 2016-12-03 NOTE — Telephone Encounter (Signed)
Pt advised and voiced understanding.   

## 2016-12-03 NOTE — Telephone Encounter (Signed)
Fax from pharmacy came in requesting 90 day supply. Original request was for 90 days. Please advise. Thanks.

## 2016-12-18 ENCOUNTER — Ambulatory Visit: Payer: Self-pay | Admitting: Family Medicine

## 2016-12-19 ENCOUNTER — Encounter: Payer: Self-pay | Admitting: Family Medicine

## 2016-12-19 ENCOUNTER — Ambulatory Visit (INDEPENDENT_AMBULATORY_CARE_PROVIDER_SITE_OTHER): Payer: 59 | Admitting: Family Medicine

## 2016-12-19 VITALS — BP 104/67 | HR 80 | Temp 98.5°F | Resp 16 | Ht 63.0 in | Wt 168.8 lb

## 2016-12-19 DIAGNOSIS — G8929 Other chronic pain: Secondary | ICD-10-CM

## 2016-12-19 DIAGNOSIS — M5442 Lumbago with sciatica, left side: Secondary | ICD-10-CM | POA: Diagnosis not present

## 2016-12-19 DIAGNOSIS — F32A Depression, unspecified: Secondary | ICD-10-CM

## 2016-12-19 DIAGNOSIS — F419 Anxiety disorder, unspecified: Secondary | ICD-10-CM

## 2016-12-19 DIAGNOSIS — G894 Chronic pain syndrome: Secondary | ICD-10-CM | POA: Diagnosis not present

## 2016-12-19 DIAGNOSIS — F329 Major depressive disorder, single episode, unspecified: Secondary | ICD-10-CM | POA: Diagnosis not present

## 2016-12-19 DIAGNOSIS — Z79899 Other long term (current) drug therapy: Secondary | ICD-10-CM | POA: Diagnosis not present

## 2016-12-19 DIAGNOSIS — Z79891 Long term (current) use of opiate analgesic: Secondary | ICD-10-CM | POA: Diagnosis not present

## 2016-12-19 MED ORDER — HYDROCODONE-ACETAMINOPHEN 5-325 MG PO TABS: 1.0000 | ORAL_TABLET | Freq: Four times a day (QID) | ORAL | 0 refills | Status: DC | PRN

## 2016-12-19 NOTE — Progress Notes (Signed)
OFFICE VISIT  12/19/2016   CC:  Chief Complaint  Patient presents with  . Follow-up    RCI   HPI:    Patient is a 26 y.o. Caucasian female who presents accompanied by her significant other for follow up chronic low back pain/chronic pain with L leg radiculopathy syndrome requiring chronic narcotic pain medication for optimal functioning and quality of life.  Also f/u GAD and depression.  Indication for chronic opioid: see above. Medication and dose: Vicodin 5/325, 1-2 q6h prn # pills per month: #60 (lasts 1-2 mo). Last UDS date: none Pain contract signed (Y/N): yes; 06/10/16 Date narcotic database last reviewed (include red flags): today-no red flags. Most recent vicodin fill date is 09/27/16, #60.  PAIN: she planned on getting back for f/u with Dr. Channing Muttersoy after our last visit 04/2017 but b/c of financial reasons she has had to put this off.  She is seeing chiropracter, using TENS unit--sees him about q 2 weeks. Still with L lower back pain that radiates down left leg, has good days and bad days.  Some days completely confined to bed, others the pain is just mildly bothersome.  Still taking ibup or naprox in daytime, saving vicodin for hs--usually 1 tab. No side effects from vicodin are felt.  She is unable to work due to her back pain. She is going to try to start getting pregnant soon. Doing well on current psych regimen.  Meditating/yoga helping.  Past Medical History:  Diagnosis Date  . GAD (generalized anxiety disorder)   . Infectious mononucleosis 2007   ?whooping cough, too??  . Lumbosacral radiculopathy at L5 2015   [facility]. [facility]ullo at [facility]; then 2nd opinion with [facility], where she got ESI left L5-S1 11/2013 with moderate improvement.  Saw neurosurgeon 01/2016: surgery NOT recommended.  PT initiated.  . MDD (major depressive disorder)    r/o OCD per psychiatrist 03/2016.  Also, psychologist eval revealed suspected Bipolar II, depressed phase  04/16/2016.  . Pre-syncope 2016   ? POTS.  Holter showed mostly NSR, occ PACs, one run of nonsustained atrial tachy.    Past Surgical History:  Procedure Laterality Date  . WISDOM TOOTH EXTRACTION      Outpatient Medications Prior to Visit  Medication Sig Dispense Refill  . Adapalene-Benzoyl Peroxide (EPIDUO FORTE) 0.3-2.5 % GEL Apply 1 drop topically at bedtime. 45 g 11  . cyclobenzaprine (FLEXERIL) 10 MG tablet TAKE 1 TABLET (10 MG TOTAL) BY MOUTH 3 (THREE) TIMES DAILY AS NEEDED FOR MUSCLE SPASMS. 60 tablet 1  . FLUoxetine (PROZAC) 20 MG capsule Take 2 capsules (40 mg total) by mouth daily. 180 capsule 1  . fluticasone (FLONASE) 50 MCG/ACT nasal spray Place into both nostrils daily as needed.     . loratadine (CLARITIN) 10 MG tablet Take 10 mg by mouth daily as needed.     [name]. LORazepam (ATIVAN) 0.5 MG tablet Take 0.5 mg by mouth every 8 (eight) hours as needed for anxiety. Per psychiatrist    . oxyCODONE-acetaminophen (PERCOCET/ROXICET) 5-325 MG tablet Take 1-2 tablets by mouth every 6 (six) hours as needed for severe pain. 60 tablet 0  . traZODone (DESYREL) 50 MG tablet 1-2 tabs po qhs prn insomnia 180 tablet 1  . HYDROcodone-acetaminophen (NORCO/VICODIN) 5-325 MG tablet Take 1-2 tablets by mouth every 6 (six) hours as needed for moderate pain. 60 tablet 0   No facility-administered medications prior to visit.     Allergies  Allergen Reactions  .  Fish-Derived Products     Shell fish make her sick    ROS As per HPI  PE: Blood pressure 104/67, pulse 80, temperature 98.5 F (36.9 C), temperature source Oral, resp. rate 16, height 5\' 3"  (1.6 m), weight 168 lb 12 oz (76.5 kg), last menstrual period 11/17/2016, SpO2 100 %. Gen: Alert, well appearing.  Patient is oriented to person, place, time, and situation. AFFECT: pleasant, lucid thought and speech. Low back: diffuse TTP in lumbosacral region, L>R.  DTRs symmetric in patellar and achilles regions. Strength in legs 5/5 prox/dist  bilat.  Sitting SLR neg on R, + at 30 deg on L--L LB pain with radiation down L leg to foot the further her LL is extended.  LABS:  None today  IMPRESSION AND PLAN:  1) Chronic LB pain with L leg radiculopathy: at last neurosurg f/u she was told surgery is not recommended. She is doing well with chiropracter treatments regularly and takes vicodin on a pretty minimal basis. Has some oxycodone that she uses only for "flares" of persistently severe pain (she says most recent time she had to use this was 2-4 wks ago).  Uses flexeril fairly regularly (most recent last night).  Most recent vicodin was last night. I printed rx for Vicodin 5/325, 1-2 q6h prn, #90 today--increased # of tabs should last her 3 mo based on # used over the last couple of months.  UDS today.  2) Anxiety and depression: The current medical regimen is effective;  continue present plan and medications. Not using ativan lately at all.  An After Visit Summary was printed and given to the patient.  FOLLOW UP: Return in about 3 months (around 03/21/2017) for f/u chronic pain.  Signed:  Santiago Bumpers, MD           12/19/2016

## 2017-02-03 DIAGNOSIS — M9903 Segmental and somatic dysfunction of lumbar region: Secondary | ICD-10-CM | POA: Diagnosis not present

## 2017-02-03 DIAGNOSIS — M9901 Segmental and somatic dysfunction of cervical region: Secondary | ICD-10-CM | POA: Diagnosis not present

## 2017-02-03 DIAGNOSIS — M5442 Lumbago with sciatica, left side: Secondary | ICD-10-CM | POA: Diagnosis not present

## 2017-03-05 ENCOUNTER — Emergency Department (HOSPITAL_COMMUNITY)
Admission: EM | Admit: 2017-03-05 | Discharge: 2017-03-06 | Disposition: A | Discharge status: Home / Self Care | Payer: 59 | Attending: Emergency Medicine | Admitting: Emergency Medicine

## 2017-03-05 ENCOUNTER — Encounter (HOSPITAL_COMMUNITY): Payer: Self-pay | Admitting: Emergency Medicine

## 2017-03-05 DIAGNOSIS — H9203 Otalgia, bilateral: Secondary | ICD-10-CM

## 2017-03-05 DIAGNOSIS — Z79899 Other long term (current) drug therapy: Secondary | ICD-10-CM | POA: Insufficient documentation

## 2017-03-05 DIAGNOSIS — J029 Acute pharyngitis, unspecified: Secondary | ICD-10-CM

## 2017-03-05 LAB — RAPID STREP SCREEN (MED CTR MEBANE ONLY): Streptococcus, Group A Screen (Direct): NEGATIVE

## 2017-03-05 NOTE — ED Provider Notes (Signed)
[facility]Y DEPT Provider Note   CSN: [id number] Arrival date & time: 03/05/17  2122   History   Chief Complaint Chief Complaint  Patient presents with  . Otalgia    HPI Susan Chandler is a 26 y.o. female who presents with bilateral ear pain and a sore throat. No significant PMH. Her symptoms started yesterday. She reports associated nasal congestion, nausea, diarrhea. No known sick contacts. She denies fever, chills, abdominal pain, vomiting.   HPI  Past Medical History:  Diagnosis Date  . GAD (generalized anxiety disorder)   . Infectious mononucleosis 2007   ?whooping cough, too??  . Lumbosacral radiculopathy at L5 2015   Dr. Retia Passe at [facility]; then 2nd opinion with Dr. Shelle Iron at [facility], where she got ESI left L5-S1 11/2013 with moderate improvement.  Saw neurosurgeon 01/2016: surgery NOT recommended.  PT initiated.  . MDD (major depressive disorder)    r/o OCD per psychiatrist 03/2016.  Also, psychologist eval revealed suspected Bipolar II, depressed phase 04/16/2016.  . Pre-syncope 2016   ? POTS.  Holter showed mostly NSR, occ PACs, one run of nonsustained atrial tachy.    Patient Active Problem List   Diagnosis Date Noted  . Moderate single current episode of major depressive disorder (HCC) 04/05/2016  . Social anxiety disorder 01/08/2016  . Maxillary sinusitis, acute 09/12/2015  . Generalized anxiety disorder 02/04/2014  . Insomnia 02/04/2014  . Left lumbar radiculopathy 10/18/2013    Past Surgical History:  Procedure Laterality Date  . WISDOM TOOTH EXTRACTION      OB History    No data available       Home Medications    Prior to Admission medications   Medication Sig Start Date End Date Taking? Authorizing Provider  Adapalene-Benzoyl Peroxide (EPIDUO FORTE) 0.3-2.5 % GEL Apply 1 drop topically at bedtime. 07/24/15   Lazaro Arms, MD  cyclobenzaprine (FLEXERIL) 10 MG tablet TAKE 1 TABLET (10 MG TOTAL) BY MOUTH 3 (THREE)  TIMES DAILY AS NEEDED FOR MUSCLE SPASMS. 03/07/16   McGowen, Maryjean Morn, MD  FLUoxetine (PROZAC) 20 MG capsule Take 2 capsules (40 mg total) by mouth daily. 08/26/16   McGowen, Maryjean Morn, MD  fluticasone (FLONASE) 50 MCG/ACT nasal spray Place into both nostrils daily as needed.     [provider]  HYDROcodone-acetaminophen (NORCO/VICODIN) 5-325 MG tablet Take 1-2 tablets by mouth every 6 (six) hours as needed for moderate pain. 12/19/16   McGowen, Maryjean Morn, MD  loratadine (CLARITIN) 10 MG tablet Take 10 mg by mouth daily as needed.     [provider]  LORazepam (ATIVAN) 0.5 MG tablet Take 0.5 mg by mouth every 8 (eight) hours as needed for anxiety. Per psychiatrist    [provider]  oxyCODONE-acetaminophen (PERCOCET/ROXICET) 5-325 MG tablet Take 1-2 tablets by mouth every 6 (six) hours as needed for severe pain. 08/16/15   Jeoffrey Massed, MD  traZODone (DESYREL) 50 MG tablet 1-2 tabs po qhs prn insomnia 12/03/16   McGowen, Maryjean Morn, MD    Family History Family History  Problem Relation Age of Onset  . Cancer Maternal Aunt     Social History Social History  Substance Use Topics  . Smoking status: Never Smoker  . Smokeless tobacco: Never Used  . Alcohol use No     Comment: 04-05-2016 per pt no     Allergies   Fish-derived products   Review of Systems Review of Systems  Constitutional: Negative for chills and fever.  HENT:  Positive for congestion, ear pain and sore throat. Negative for ear discharge and rhinorrhea.   Respiratory: Negative for cough.   Gastrointestinal: Positive for diarrhea and nausea. Negative for abdominal pain and vomiting.     Physical Exam Updated Vital Signs BP 110/68 (BP Location: Right Arm)   Pulse 65   Temp 98.6 F (37 C) (Oral)   Resp 18   Ht 5\' 4"  (1.626 m)   Wt 72.6 kg (160 lb)   LMP 02/24/2017   SpO2 98%   BMI 27.46 kg/m   Physical Exam  Constitutional: She is oriented to person, place, and time. She appears  well-developed and well-nourished. No distress.  HENT:  Head: Normocephalic and atraumatic.  Right Ear: Hearing, tympanic membrane, external ear and ear canal normal.  Left Ear: Hearing, tympanic membrane, external ear and ear canal normal.  Nose: Nose normal.  Mouth/Throat: Uvula is midline, oropharynx is clear and moist and mucous membranes are normal.  Eyes: Pupils are equal, round, and reactive to light. Conjunctivae are normal. Right eye exhibits no discharge. Left eye exhibits no discharge. No scleral icterus.  Neck: Normal range of motion.  Cardiovascular: Normal rate and regular rhythm.  Exam reveals no gallop and no friction rub.   No murmur heard. Pulmonary/Chest: Effort normal and breath sounds normal. No respiratory distress. She has no wheezes. She has no rales. She exhibits no tenderness.  Abdominal: Soft. Bowel sounds are normal. She exhibits no distension. There is tenderness (generalized).  Lymphadenopathy:    She has cervical adenopathy (tender cervical adenopathy).  Neurological: She is alert and oriented to person, place, and time.  Skin: Skin is warm and dry.  Psychiatric: She has a normal mood and affect. Her behavior is normal.  Nursing note and vitals reviewed.    ED Treatments / Results  Labs (all labs ordered are listed, but only abnormal results are displayed) Labs Reviewed  RAPID STREP SCREEN (NOT AT [facility])  CULTURE, GROUP A STREP [facility])    EKG  EKG Interpretation None       Radiology No results found.  Procedures Procedures (including critical care time)  Medications Ordered in ED Medications - No data to display   Initial Impression / Assessment and Plan / ED Course  I have reviewed the triage vital signs and the nursing notes.  Pertinent labs & imaging results that were available during my care of the patient were reviewed by me and considered in my medical decision making (see chart for details).  26 year old female presents with  likely a viral illness. Vitals are normal. She is well-appearing. Rapid strep is negative. Discussed supportive care. Return precautions given.  Final Clinical Impressions(s) / ED Diagnoses   Final diagnoses:  Pain behind the ear, bilateral  Sore throat    New Prescriptions New Prescriptions   No medications on file     Beryle QuantGekas, Lillieann Pavlich Marie, PA-C 03/06/17 [id number]    Knapp, Jon, MD 03/08/17 [phone]

## 2017-03-05 NOTE — ED Triage Notes (Signed)
Pt c/o bilateral ear pain and sore throat since yesterday.

## 2017-03-06 NOTE — Discharge Instructions (Signed)
Please rest and drink plenty of fluids. Take Tylenol or Ibuprofen for pain/fever Follow up with your doctor

## 2017-03-08 LAB — CULTURE, GROUP A STREP (THRC)

## 2017-03-13 ENCOUNTER — Other Ambulatory Visit: Payer: Self-pay | Admitting: *Deleted

## 2017-03-13 MED ORDER — CYCLOBENZAPRINE HCL 10 MG PO TABS
10.0000 mg | ORAL_TABLET | Freq: Three times a day (TID) | ORAL | 1 refills | Status: DC | PRN
Start: 2017-03-13 — End: 2017-05-05

## 2017-03-13 MED ORDER — OXYCODONE-ACETAMINOPHEN 5-325 MG PO TABS: 1.0000 | ORAL_TABLET | Freq: Four times a day (QID) | ORAL | 0 refills | Status: DC | PRN

## 2017-03-13 MED ORDER — TRAZODONE HCL 50 MG PO TABS: ORAL_TABLET | ORAL | 1 refills | Status: DC

## 2017-03-13 MED ORDER — HYDROCODONE-ACETAMINOPHEN 5-325 MG PO TABS: 1.0000 | ORAL_TABLET | Freq: Four times a day (QID) | ORAL | 0 refills | Status: DC | PRN

## 2017-03-13 NOTE — Telephone Encounter (Signed)
Pt called requesting refills.   RF request for flexeril LOV: 12/19/16 Next ov: None Last written: 03/07/16 #60 w/ 1RF  RF request for trazodone Last written: 12/03/16 #180 w/ 1RF  RF request for hydro/apap Last written: 12/19/16 #90 w/ 0RF  RF request for oxyco/apap Last written: 08/16/15 #60 w/ Wende Crease  Please advise. Thanks.

## 2017-03-13 NOTE — Telephone Encounter (Signed)
Will give #30 vicodin and #30 percocet. Pt needs f/u chronic pain office visit in the next 1 mo before any FURTHER pain meds will be rx'd.

## 2017-03-13 NOTE — Telephone Encounter (Signed)
Pt advised and voiced understanding. She stated that she will call back to schedule an apt. Rx's put up front for p/u.

## 2017-03-18 ENCOUNTER — Other Ambulatory Visit: Payer: Self-pay | Admitting: *Deleted

## 2017-03-18 MED ORDER — FLUOXETINE HCL 20 MG PO CAPS: 40.0000 mg | ORAL_CAPSULE | Freq: Every day | ORAL | 0 refills | Status: DC

## 2017-03-19 MED ORDER — FLUOXETINE HCL 20 MG PO CAPS: 40.0000 mg | ORAL_CAPSULE | Freq: Every day | ORAL | 0 refills | Status: DC

## 2017-03-19 NOTE — Addendum Note (Signed)
Addended by: Smitty Knudsen on: 03/19/2017 12:48 PM   Modules accepted: Orders

## 2017-03-25 ENCOUNTER — Ambulatory Visit (INDEPENDENT_AMBULATORY_CARE_PROVIDER_SITE_OTHER): Payer: 59 | Admitting: Family Medicine

## 2017-03-25 ENCOUNTER — Ambulatory Visit (INDEPENDENT_AMBULATORY_CARE_PROVIDER_SITE_OTHER): Payer: 59 | Admitting: Clinical

## 2017-03-25 ENCOUNTER — Encounter: Payer: Self-pay | Admitting: Family Medicine

## 2017-03-25 VITALS — BP 97/64 | HR 76 | Temp 98.2°F | Resp 16 | Ht 63.0 in | Wt 166.2 lb

## 2017-03-25 DIAGNOSIS — F329 Major depressive disorder, single episode, unspecified: Secondary | ICD-10-CM | POA: Diagnosis not present

## 2017-03-25 DIAGNOSIS — F32A Depression, unspecified: Secondary | ICD-10-CM

## 2017-03-25 DIAGNOSIS — G43909 Migraine, unspecified, not intractable, without status migrainosus: Secondary | ICD-10-CM | POA: Diagnosis not present

## 2017-03-25 DIAGNOSIS — F419 Anxiety disorder, unspecified: Secondary | ICD-10-CM | POA: Diagnosis not present

## 2017-03-25 DIAGNOSIS — G894 Chronic pain syndrome: Secondary | ICD-10-CM | POA: Diagnosis not present

## 2017-03-25 DIAGNOSIS — F3160 Bipolar disorder, current episode mixed, unspecified: Secondary | ICD-10-CM | POA: Diagnosis not present

## 2017-03-25 MED ORDER — RIZATRIPTAN BENZOATE 10 MG PO TABS: 10.0000 mg | ORAL_TABLET | ORAL | 1 refills | Status: DC | PRN

## 2017-03-25 MED ORDER — HYDROCODONE-ACETAMINOPHEN 5-325 MG PO TABS: 1.0000 | ORAL_TABLET | Freq: Four times a day (QID) | ORAL | 0 refills | Status: DC | PRN

## 2017-03-25 NOTE — Patient Instructions (Signed)
Start a headache diary/calender.
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
## 2017-03-26 ENCOUNTER — Telehealth: Payer: Self-pay | Admitting: *Deleted

## 2017-03-26 NOTE — Telephone Encounter (Signed)
Left message for pt to call back  °

## 2017-03-26 NOTE — Telephone Encounter (Signed)
Patient advised Rx's should be ready for pick up at pharmacy.  Patient was very appreciative to [place] for the help.

## 2017-03-26 NOTE — Telephone Encounter (Signed)
Pt LMOM on 03/26/17 at 8:38am stating that her pharmacy will not fill her Rx for oxycodone because she did not get it filled in September when the Rx was written. Pt stated that she could not afford to have Rx filled at that time.   Spoke with Olegario Messier [facility]ist at [facility] and she stated that pt came in with 2 Rx for hydrocodone and Rx for oxycodone from September. She stated that they did not fill this Rx because they needed to confirm that pt was suppose to be taking both and they needed documentation of Dx. I gave Olegario Messier verbal okay to fill Rx for oxycodone. I advised her that we are aware pt has both hydro/apap and oxycodone. I advised her that pt was given instructions at her last office visit on how to take these medications. Pt only takes oxycodone prn for break through pain. Olegario Messier voiced understanding. She will work on getting this Rx ready for pt.  Tried calling pt, number busy.

## 2017-04-02 ENCOUNTER — Ambulatory Visit (INDEPENDENT_AMBULATORY_CARE_PROVIDER_SITE_OTHER): Payer: 59 | Admitting: Clinical

## 2017-04-02 DIAGNOSIS — F3160 Bipolar disorder, current episode mixed, unspecified: Secondary | ICD-10-CM | POA: Diagnosis not present

## 2017-04-11 ENCOUNTER — Ambulatory Visit (INDEPENDENT_AMBULATORY_CARE_PROVIDER_SITE_OTHER): Payer: 59 | Admitting: Clinical

## 2017-04-11 ENCOUNTER — Ambulatory Visit (INDEPENDENT_AMBULATORY_CARE_PROVIDER_SITE_OTHER): Payer: 59 | Admitting: Family Medicine

## 2017-04-11 ENCOUNTER — Encounter: Payer: Self-pay | Admitting: Family Medicine

## 2017-04-11 VITALS — BP 98/63 | HR 75 | Temp 98.6°F | Resp 16 | Ht 63.0 in | Wt 168.8 lb

## 2017-04-11 DIAGNOSIS — F3160 Bipolar disorder, current episode mixed, unspecified: Secondary | ICD-10-CM | POA: Diagnosis not present

## 2017-04-11 DIAGNOSIS — F3112 Bipolar disorder, current episode manic without psychotic features, moderate: Secondary | ICD-10-CM

## 2017-04-11 MED ORDER — RISPERIDONE 0.5 MG PO TABS: 0.5000 mg | ORAL_TABLET | Freq: Two times a day (BID) | ORAL | 0 refills | Status: DC

## 2017-04-11 NOTE — Progress Notes (Signed)
OFFICE VISIT  04/13/2017   CC:  Chief Complaint  Patient presents with  . Change in Medication    recommended by Dr. Gery Pray   HPI:    Patient is a 26 y.o. Caucasian female who presents for "discuss medications". She just saw Dr. Dewayne Hatch today for counseling, and Dr. Dewayne Hatch voiced concern to me that pt may be in the midst of a hypomanic/manic episode.  In the last week she has felt "absolutely untouchable"--euphoric, restless, much decreased need for sleep. Heightened sex drive, including some visual hallucinations during sex (christmas lights, fireworks, colors).   Excessive goal driven behavior: house is spotless.  Has been more impulsive, husband has held her back from spending a lot of money on a dog.  No auditory hallucinations. Prior to this, when seeing Dr. Dewayne Hatch, she had been more depressed/foggy headed.    Since I last saw her she has been taking maxalt for migraines and this has helped.  Taking this med qod to every 2 days.  Past Medical History:  Diagnosis Date  . GAD (generalized anxiety disorder)   . Infectious mononucleosis 2007   ?whooping cough, too??  . Lumbosacral radiculopathy at L5 2015   [facility]. [facility]se at [facility]; then 2nd opinion with Dr. Shelle Iron at [facility] orth[facility], where she got ESI left L5-S1 11/2013 with moderate improvement.  Saw neurosurgeon 01/2016: surgery NOT recommended.  PT initiated.  . MDD (major depressive disorder)    r/o OCD per psychiatrist 03/2016.  Also, psychologist eval revealed suspected Bipolar II, depressed phase 04/16/2016.  . Pre-syncope 2016   ? POTS.  Holter showed mostly NSR, occ PACs, one run of nonsustained atrial tachy.    Past Surgical History:  Procedure Laterality Date  . WISDOM TOOTH EXTRACTION      Outpatient Medications Prior to Visit  Medication Sig Dispense Refill  . Adapalene-Benzoyl Peroxide (EPIDUO FORTE) 0.3-2.5 % GEL Apply 1 drop topically at bedtime. 45 g 11  . cyclobenzaprine  (FLEXERIL) 10 MG tablet Take 1 tablet (10 mg total) by mouth 3 (three) times daily as needed for muscle spasms. 60 tablet 1  . FLUoxetine (PROZAC) 20 MG capsule Take 2 capsules (40 mg total) by mouth daily. 180 capsule 0  . fluticasone (FLONASE) 50 MCG/ACT nasal spray Place into both nostrils daily as needed.     [name] HYDROcodone-acetaminophen (NORCO/VICODIN) 5-325 MG tablet Take 1-2 tablets by mouth every 6 (six) hours as needed for moderate pain. 90 tablet 0  . loratadine (CLARITIN) 10 MG tablet Take 10 mg by mouth daily as needed.     [name] LORazepam (ATIVAN) 0.5 MG tablet Take 0.5 mg by mouth every 8 (eight) hours as needed for anxiety. Per psychiatrist    . oxyCODONE-acetaminophen (PERCOCET/ROXICET) 5-325 MG tablet Take 1-2 tablets by mouth every 6 (six) hours as needed for severe pain. 30 tablet 0  . rizatriptan (MAXALT) 10 MG tablet Take 1 tablet (10 mg total) by mouth as needed for migraine. May repeat in 2 hours if needed 10 tablet 1  . traZODone (DESYREL) 50 MG tablet 1-2 tabs po qhs prn insomnia 180 tablet 1   No facility-administered medications prior to visit.     Allergies  Allergen Reactions  . Fish-Derived Products     Shell fish make her sick    ROS As per HPI  PE: Blood pressure 98/63, pulse 75, temperature 98.6 F (37 C), temperature source Oral, resp. rate 16, height 5\' 3"  (1.6 m), weight 168 lb 12 oz (76.5  kg), last menstrual period 03/26/2017, SpO2 100 %. Wt Readings from Last 2 Encounters:  04/11/17 168 lb 12 oz (76.5 kg)  03/25/17 166 lb 4 oz (75.4 kg)    Gen: alert, oriented x 4, affect pleasant.  Lucid thinking and conversation noted. Slightly restless/fidgety. HEENT: PERRLA, EOMI.   Neck: no LAD, mass, or thyromegaly. CV: RRR, no m/r/g LUNGS: CTA bilat, nonlabored. NEURO: no tremor or tics noted on observation.  Coordination intact. CN 2-12 grossly intact bilaterally, strength 5/5 in all extremeties.  No ataxia.   LABS:  none  IMPRESSION AND  PLAN:  Bipolar affective d/o, currently in hypomanic/manic phase. She describes a long history of pretty rapid cycling. Will keep her on all current meds, plus add risperdal 0.5mg  bid. Therapeutic expectations and side effect profile of medication discussed today.  Patient's questions answered. Minimize use of maxalt. Signs/symptoms to call or return for were reviewed and pt expressed understanding. At f/u in 4-5 d we'll see if we can decrease her dosing to 0.25mg  bid. She'll need mood stabilizer long term.  An After Visit Summary was printed and given to the patient.  FOLLOW UP: Return for f/u 4-5 d mood d/o.  Signed:  Santiago BumpersPhil McGowen, MD           04/13/2017

## 2017-04-14 ENCOUNTER — Telehealth: Payer: Self-pay | Admitting: Family Medicine

## 2017-04-14 ENCOUNTER — Ambulatory Visit: Payer: Self-pay | Admitting: Family Medicine

## 2017-04-14 ENCOUNTER — Encounter: Payer: Self-pay | Admitting: Family Medicine

## 2017-04-14 NOTE — Progress Notes (Deleted)
OFFICE VISIT  04/14/2017   CC: No chief complaint on file.    HPI:    Patient is a 26 y.o. Caucasian female who presents for 3 day f/u hypomanic/manic episode. I started her on risperdal 0.5mg  bid when I most recently saw her.  Past Medical History:  Diagnosis Date  . GAD (generalized anxiety disorder)   . Infectious mononucleosis 2007   ?whooping cough, too??  . Lumbosacral radiculopathy at L5 2015   Dr. Retia [facility]seSaullo at [facility]; then [facility], where she got ESI left L5-S1 11/2013 with moderate improvement.  Saw neurosurgeon 01/2016: surgery NOT recommended.  PT initiated.  . MDD (major depressive disorder)    r/o OCD per psychiatrist 03/2016.  Also, psychologist eval revealed suspected Bipolar II, depressed phase 04/16/2016.  . Pre-syncope 2016   ? POTS.  Holter showed mostly NSR, occ PACs, one run of nonsustained atrial tachy.    Past Surgical History:  Procedure Laterality Date  . WISDOM TOOTH EXTRACTION      Outpatient Medications Prior to Visit  Medication Sig Dispense Refill  . Adapalene-Benzoyl Peroxide (EPIDUO FORTE) 0.3-2.5 % GEL Apply 1 drop topically at bedtime. 45 g 11  . cyclobenzaprine (FLEXERIL) 10 MG tablet Take 1 tablet (10 mg total) by mouth 3 (three) times daily as needed for muscle spasms. 60 tablet 1  . FLUoxetine (PROZAC) 20 MG capsule Take 2 capsules (40 mg total) by mouth daily. 180 capsule 0  . fluticasone (FLONASE) 50 MCG/ACT nasal spray Place into both nostrils daily as needed.     [name]. HYDROcodone-acetaminophen (NORCO/VICODIN) 5-325 MG tablet Take 1-2 tablets by mouth every 6 (six) hours as needed for moderate pain. 90 tablet 0  . loratadine (CLARITIN) 10 MG tablet Take 10 mg by mouth daily as needed.     [name]. LORazepam (ATIVAN) 0.5 MG tablet Take 0.5 mg by mouth every 8 (eight) hours as needed for anxiety. Per psychiatrist    . oxyCODONE-acetaminophen (PERCOCET/ROXICET) 5-325 MG tablet Take 1-2 tablets by  mouth every 6 (six) hours as needed for severe pain. 30 tablet 0  . risperiDONE (RISPERDAL) 0.5 MG tablet Take 1 tablet (0.5 mg total) by mouth 2 (two) times daily. 30 tablet 0  . rizatriptan (MAXALT) 10 MG tablet Take 1 tablet (10 mg total) by mouth as needed for migraine. May repeat in 2 hours if needed 10 tablet 1  . traZODone (DESYREL) 50 MG tablet 1-2 tabs po qhs prn insomnia 180 tablet 1   No facility-administered medications prior to visit.     Allergies  Allergen Reactions  . Fish-Derived Products     Shell fish make her sick    ROS As per HPI  PE: Last menstrual period 03/26/2017. ***  LABS:  ***  IMPRESSION AND PLAN:  No problem-specific Assessment & Plan notes found for this encounter.   FOLLOW UP: No Follow-up on file.

## 2017-04-14 NOTE — Telephone Encounter (Signed)
Mailed NS >24 hr letter to patient.

## 2017-04-14 NOTE — Telephone Encounter (Signed)
Patient left VM at 9:17AM stating that she decided not to take the new Rx after talking to her husband so she no longer needs today's appt. She will keep her November appt.

## 2017-04-14 NOTE — Telephone Encounter (Signed)
Okay, please send pt a no show/late cancellation letter. Thanks.

## 2017-04-21 ENCOUNTER — Ambulatory Visit: Payer: 59 | Admitting: Clinical

## 2017-04-24 ENCOUNTER — Ambulatory Visit: Payer: Self-pay | Admitting: Family Medicine

## 2017-05-05 ENCOUNTER — Other Ambulatory Visit: Payer: Self-pay | Admitting: Family Medicine

## 2017-05-05 NOTE — Telephone Encounter (Signed)
[facility]  RF request for cyclobenzaprine LOV: 04/11/17 Next ov: None Last written: 03/13/17 #60 w/ 1RF  Please advise. Thanks.

## 2017-05-21 DIAGNOSIS — M9901 Segmental and somatic dysfunction of cervical region: Secondary | ICD-10-CM | POA: Diagnosis not present

## 2017-05-21 DIAGNOSIS — M5442 Lumbago with sciatica, left side: Secondary | ICD-10-CM | POA: Diagnosis not present

## 2017-05-21 DIAGNOSIS — M9903 Segmental and somatic dysfunction of lumbar region: Secondary | ICD-10-CM | POA: Diagnosis not present

## 2017-06-09 ENCOUNTER — Other Ambulatory Visit: Payer: 59 | Admitting: Obstetrics & Gynecology

## 2017-06-11 ENCOUNTER — Telehealth: Payer: Self-pay | Admitting: Family Medicine

## 2017-06-11 MED ORDER — HYDROCODONE-ACETAMINOPHEN 5-325 MG PO TABS: 1.0000 | ORAL_TABLET | Freq: Four times a day (QID) | ORAL | 0 refills | Status: DC | PRN

## 2017-06-11 NOTE — Telephone Encounter (Signed)
Copied from CRM [id number]. Topic: Inquiry >> Jun 11, 2017  1:07 PM [name], [name] wrote:  Pt will run out of Rx needed before her visit in Jan.  She is asking that she could get the Rx's below refilled for her to last her until her Jan 9th visit.  Hydrocodone - 5.325mg  ? - pt not sure or mg Trazodone - ? Pt doesn't know mg  [facility] [place] [facility]  [phone]

## 2017-06-11 NOTE — Telephone Encounter (Signed)
Copied from CRM #[id number]. Topic: Inquiry °>> Jun 11, 2017  1:07 PM Hayes, [name] wrote: ° °Pt will run out of Rx needed before her visit in Jan.  She is asking that she could get the Rx's below refilled for her to last her until her Jan 9th visit. ° °Hydrocodone - 5.325mg ? - pt not sure or mg °Trazodone - ? Pt doesn't know mg ° °[facility] [place] -  [phone] °

## 2017-06-11 NOTE — Telephone Encounter (Signed)
Pt has 1 refill on trazodone. Pt will call pharmacy for that.  Pt requesting refill on hydrocodone. Pt states if Dr. Only wants to do partial refill, enough to get her through to appt 07/02/16, that is ok

## 2017-06-11 NOTE — Telephone Encounter (Signed)
Vicodin rx printed. 

## 2017-06-11 NOTE — Telephone Encounter (Signed)
RF request for hydrocodone/apap LOV: 12/19/16 Next ov: 07/02/17 Last written: 03/25/17 #90 w/ [id number]  Please advise. Thanks.

## 2017-06-12 NOTE — Telephone Encounter (Signed)
Detailed message left on voice mail for patient informing her that her prescription is ready for pick up.

## 2017-06-12 NOTE — Telephone Encounter (Signed)
Pt states her sister is coming to pick up her rx, and she gives her permission.

## 2017-06-12 NOTE — Telephone Encounter (Signed)
Rx has been put up front for p/u. Pt should still have 1 refill left at the pharmacy from Rx sent on 03/13/17.

## 2017-06-20 ENCOUNTER — Other Ambulatory Visit: Payer: Self-pay | Admitting: Family Medicine

## 2017-06-23 DIAGNOSIS — M5442 Lumbago with sciatica, left side: Secondary | ICD-10-CM | POA: Diagnosis not present

## 2017-06-23 DIAGNOSIS — M9901 Segmental and somatic dysfunction of cervical region: Secondary | ICD-10-CM | POA: Diagnosis not present

## 2017-06-23 DIAGNOSIS — M9903 Segmental and somatic dysfunction of lumbar region: Secondary | ICD-10-CM | POA: Diagnosis not present

## 2017-06-26 DIAGNOSIS — E282 Polycystic ovarian syndrome: Secondary | ICD-10-CM | POA: Diagnosis not present

## 2017-06-26 DIAGNOSIS — Z01419 Encounter for gynecological examination (general) (routine) without abnormal findings: Secondary | ICD-10-CM | POA: Diagnosis not present

## 2017-07-02 ENCOUNTER — Ambulatory Visit: Payer: 59 | Admitting: Family Medicine

## 2017-07-02 ENCOUNTER — Encounter: Payer: Self-pay | Admitting: Family Medicine

## 2017-07-02 VITALS — BP 118/60 | HR 100 | Resp 16 | Wt 180.0 lb

## 2017-07-02 DIAGNOSIS — F3161 Bipolar disorder, current episode mixed, mild: Secondary | ICD-10-CM | POA: Diagnosis not present

## 2017-07-02 DIAGNOSIS — G43909 Migraine, unspecified, not intractable, without status migrainosus: Secondary | ICD-10-CM

## 2017-07-02 DIAGNOSIS — M5416 Radiculopathy, lumbar region: Secondary | ICD-10-CM | POA: Diagnosis not present

## 2017-07-02 DIAGNOSIS — G894 Chronic pain syndrome: Secondary | ICD-10-CM | POA: Diagnosis not present

## 2017-07-02 MED ORDER — RIZATRIPTAN BENZOATE 10 MG PO TABS: 10.0000 mg | ORAL_TABLET | ORAL | 1 refills | Status: DC | PRN

## 2017-07-02 MED ORDER — OXYCODONE-ACETAMINOPHEN 5-325 MG PO TABS: 1.0000 | ORAL_TABLET | Freq: Four times a day (QID) | ORAL | 0 refills | Status: DC | PRN

## 2017-07-02 MED ORDER — LORAZEPAM 0.5 MG PO TABS: 0.5000 mg | ORAL_TABLET | Freq: Three times a day (TID) | ORAL | 1 refills | Status: DC | PRN

## 2017-07-02 MED ORDER — LAMOTRIGINE 25 MG PO TABS: ORAL_TABLET | ORAL | 0 refills | Status: DC

## 2017-07-02 NOTE — Progress Notes (Signed)
OFFICE VISIT  07/02/2017   CC:  Chief Complaint  Patient presents with  . Follow-up    rci   HPI:    Patient is a 27 y.o. Caucasian female who presents for chronic pain syndrome and bipolar disorder with GAD.  I also started her on maxalt 03/2017 as abortive med for frequent migraine HA's she was having. Maxalt helps well as abortive med, pt asks for RF of this med today.  Indication for chronic opioid: chronic LBP with left L5 radiculopathy. Medication and dose: Vicodin 5/325 and percocet 5/325. # pills per month: Vicodin 90 (lasts about 2 mo).  Percocet #30. Last UDS date: 12/19/16. Pain contract signed (Y/N): Y Date narcotic database last reviewed (include red flags): today, no red flags.  Last month or so she has had signif L side radiculopathy sx's, seeing chiropracter, regularly using pain meds. Using percocet 1-2 tabs per day.  Using vicodin 1 tab per night. Denies side effect from meds.   About 2 and 1/2 mo ago I added risperdal 0.5mg  bid to her med regimen b/c of rapid cycling bipolar disorder. This made her feel tired/sleepy and she stopped it after 1 week. Symptoms currently: "I honestly feel good".  Has been trying to be more physically active.   Suffering mini panic attacks more lately. Taking fluoxetine 40mg  qd. Also taking 0.5mg  ativan tid as per psychiatrist she used to see in [place]--no longer sees her. She is still trying to keep seeing Dr. Dewayne HatchMendelson here.  Uses ativan about 2-3 times per week. Pt asks for me to assume rx'ing of ativan today.  LMP 06/11/17.  Past Medical History:  Diagnosis Date  . Bipolar disorder [facility](HCC)    r/o OCD per psychiatrist 03/2016.  Also, psychologist eval revealed suspected Bipolar II, depressed phase 04/16/2016.  [name]. Chronic pain syndrome   . GAD (generalized anxiety disorder)   . Infectious mononucleosis 2007   ?whooping cough, too??  . Lumbosacral radiculopathy at L5 2015   Dr. [facility]ullo at [facility];  then [facility], where she got ESI left L5-S1 11/2013 with moderate improvement.  Saw neurosurgeon 01/2016: surgery NOT recommended.  PT initiated.  . Migraine syndrome   . Obesity, Class I, BMI 30-34.9   . Pre-syncope 2016   ? POTS.  Holter showed mostly NSR, occ PACs, one run of nonsustained atrial tachy.    Past Surgical History:  Procedure Laterality Date  . WISDOM TOOTH EXTRACTION      Outpatient Medications Prior to Visit  Medication Sig Dispense Refill  . Adapalene-Benzoyl Peroxide (EPIDUO FORTE) 0.3-2.5 % GEL Apply 1 drop topically at bedtime. 45 g 11  . cyclobenzaprine (FLEXERIL) 10 MG tablet TAKE 1 TABLET BY MOUTH THREE TIMES A DAY AS NEEDED FOR MUSCLE SPASMS 60 tablet 5  . FLUoxetine (PROZAC) 20 MG capsule TAKE 2 CAPSULES BY MOUTH EVERY DAY 180 capsule 0  . fluticasone (FLONASE) 50 MCG/ACT nasal spray Place into both nostrils daily as needed.     [name]. HYDROcodone-acetaminophen (NORCO/VICODIN) 5-325 MG tablet Take 1-2 tablets by mouth every 6 (six) hours as needed for moderate pain. 90 tablet 0  . loratadine (CLARITIN) 10 MG tablet Take 10 mg by mouth daily as needed.     . norgestimate-ethinyl estradiol (ORTHO-CYCLEN,SPRINTEC,PREVIFEM) 0.25-35 MG-MCG tablet Take by mouth.    . traZODone (DESYREL) 50 MG tablet 1-2 tabs po qhs prn insomnia 180 tablet 1  . LORazepam (ATIVAN) 0.5 MG tablet Take 0.5 mg by  mouth every 8 (eight) hours as needed for anxiety. Per psychiatrist    . oxyCODONE-acetaminophen (PERCOCET/ROXICET) 5-325 MG tablet Take 1-2 tablets by mouth every 6 (six) hours as needed for severe pain. 30 tablet 0  . risperiDONE (RISPERDAL) 0.5 MG tablet Take 1 tablet (0.5 mg total) by mouth 2 (two) times daily. 30 tablet 0  . rizatriptan (MAXALT) 10 MG tablet Take 1 tablet (10 mg total) by mouth as needed for migraine. May repeat in 2 hours if needed 10 tablet 1   No facility-administered medications prior to visit.     Allergies  Allergen Reactions   . Fish-Derived Products     Shell fish make her sick    ROS As per HPI  PE: Blood pressure 118/60, pulse 100, resp. rate 16, weight 180 lb (81.6 kg), last menstrual period 06/12/2017, SpO2 98 %. Body mass index is 31.89 kg/m.  Gen: Alert, well appearing.  Patient is oriented to person, place, time, and situation. AFFECT: pleasant, lucid thought and speech. No further exam today.  LABS:  Lab Results  Component Value Date   TSH 2.14 10/20/2014   Lab Results  Component Value Date   WBC 5.3 10/20/2014   HGB 13.4 10/20/2014   HCT 39.8 10/20/2014   MCV 91.5 10/20/2014   PLT 278.0 10/20/2014   Lab Results  Component Value Date   CREATININE 0.59 10/20/2014   BUN 12 10/20/2014   NA 138 10/20/2014   K 4.8 10/20/2014   CL 104 10/20/2014   CO2 30 10/20/2014   Lab Results  Component Value Date   ALT 13 10/20/2014   AST 15 10/20/2014   ALKPHOS 48 10/20/2014   BILITOT 0.3 10/20/2014    IMPRESSION AND PLAN:  1) Bipolar affective disorder + GAD w/panic: currently feeling pretty stable, but we need to get her on a mood stabilizer anyway.  Risperdal too sedating for her. Will start lamictal 25mg  qd x 15d, then increase to 50mg  qd and I'll recheck her again in 1 mo. Therapeutic expectations and side effect profile of medication discussed today.  Patient's questions answered. Meanwhile, she'll continue prozac 40mg  qd and ativan 0.5mg  tid prn.   Rx for ativan 0.5mg , 1 tid, #60, RF x 1 handed to pt with her percocet rx. Encouraged her to continue counseling relationship with Dr. Otto Herb.  2) Chronic pain syndrome: lumbar radiculopathy:  Continue percocet 5/325, 1-2 q6h prn, #60 (increased dispensation since pain level increased lately). No vicodin RF was needed today. Continue chiropractor therapies. We renewed her controlled substance contract today.  3) Migraine syndrome: maxalt working great for abortive med. Given the fact that we're trying to get lamictal going at this  time, I'll postpone getting her on a migraine preventative med at this time. RF'd maxalt today.  An After Visit Summary was printed and given to the patient.  FOLLOW UP: Return in about 4 weeks (around 07/30/2017) for f/u psych med.  Signed:  Santiago Bumpers, MD           07/02/2017

## 2017-07-10 ENCOUNTER — Ambulatory Visit: Payer: 59 | Admitting: Family Medicine

## 2017-07-10 ENCOUNTER — Encounter: Payer: Self-pay | Admitting: Family Medicine

## 2017-07-10 VITALS — BP 94/64 | HR 98 | Temp 97.8°F | Ht 63.0 in | Wt 180.8 lb

## 2017-07-10 DIAGNOSIS — E669 Obesity, unspecified: Secondary | ICD-10-CM

## 2017-07-10 NOTE — Progress Notes (Signed)
OFFICE NOTE  07/10/2017  CC:  Chief Complaint  Patient presents with  . Back Pain    pts is here to enquire about possible test for PCOS   HPI:   Patient is a 27 y.o. Caucasian female who is here for questions about polycystic ovarian syndrome. She saw her GYN MD on 06/26/17 (Dr. Ralph DowdyBuist) for painful and heavy menses.  She also had stopped taking her OCP and noted her acne started returning.  OCPs had helped her DUB some.  She has had problems with wt gain, fluctuated up to 200 at one point, then down to 160s, now at 180 lbs.  Dr. Ralph DowdyBuist felt like she had PCOS and did lab work on her and her LH/FSH ratio was 2.8, CMET normal, HbA1c normal, TSH normal. Pap was normal. He recommended she start OCP--she started ortho cyclen 3 d/a.  Pt is here for my opinion on this situation.  Today she tells me she is most concerned about her weight, says she eats a VERY good diet and walks some for exercise (60-90 min of walking on treadmill per day).  Chronic back pain prevents more vigorous exercise. Unclear whether or not she has had problems with fertility.  Since HS periods have been consistently normal intervals, with a variable amount of bleeding.  Every other cycle the pain is intense.    She denies hx of acne or hirsuitism.  Admits to a slight faint line of hair over upper lip, has a "few chin hairs" every now and then.  Of note, she says the lamictal I started her on about a week ago is already helpful and she is very pleased.  Pertinent PMH:  Past Medical History:  Diagnosis Date  . Bipolar disorder [facility](HCC)    r/o OCD per psychiatrist 03/2016.  Also, psychologist eval revealed suspected Bipolar II, depressed phase 04/16/2016.  [name]. Chronic pain syndrome   . GAD (generalized anxiety disorder)   . Infectious mononucleosis 2007   ?whooping cough, too??  . Lumbosacral radiculopathy at L5 2015   [facility]; then [facility], where she got ESI  left L5-S1 11/2013 with moderate improvement.  Saw neurosurgeon 01/2016: surgery NOT recommended.  PT initiated.  . Migraine syndrome   . Obesity, Class I, BMI 30-34.9   . PCOS (polycystic ovarian syndrome)    Her GYN, Dr. Ralph DowdyBuist, is working her up for this.  . Pre-syncope 2016   ? POTS.  Holter showed mostly NSR, occ PACs, one run of nonsustained atrial tachy.   Past Surgical History:  Procedure Laterality Date  . WISDOM TOOTH EXTRACTION      MEDS;   Outpatient Medications Prior to Visit  Medication Sig Dispense Refill  . Adapalene-Benzoyl Peroxide (EPIDUO FORTE) 0.3-2.5 % GEL Apply 1 drop topically at bedtime. 45 g 11  . cyclobenzaprine (FLEXERIL) 10 MG tablet TAKE 1 TABLET BY MOUTH THREE TIMES A DAY AS NEEDED FOR MUSCLE SPASMS 60 tablet 5  . FLUoxetine (PROZAC) 20 MG capsule TAKE 2 CAPSULES BY MOUTH EVERY DAY 180 capsule 0  . fluticasone (FLONASE) 50 MCG/ACT nasal spray Place into both nostrils daily as needed.     [name]. HYDROcodone-acetaminophen (NORCO/VICODIN) 5-325 MG tablet Take 1-2 tablets by mouth every 6 (six) hours as needed for moderate pain. 90 tablet 0  . lamoTRIgine (LAMICTAL) 25 MG tablet 1 tab po qd x 15d, then 2 tabs po qd x 15d 45 tablet 0  . loratadine (  CLARITIN) 10 MG tablet Take 10 mg by mouth daily as needed.     [name] LORazepam (ATIVAN) 0.5 MG tablet Take 1 tablet (0.5 mg total) by mouth every 8 (eight) hours as needed for anxiety. Per psychiatrist 60 tablet 1  . norgestimate-ethinyl estradiol (ORTHO-CYCLEN,SPRINTEC,PREVIFEM) 0.25-35 MG-MCG tablet Take by mouth.    . oxyCODONE-acetaminophen (PERCOCET/ROXICET) 5-325 MG tablet Take 1-2 tablets by mouth every 6 (six) hours as needed for severe pain. 60 tablet 0  . rizatriptan (MAXALT) 10 MG tablet Take 1 tablet (10 mg total) by mouth as needed for migraine. May repeat in 2 hours if needed 10 tablet 1  . traZODone (DESYREL) 50 MG tablet 1-2 tabs po qhs prn insomnia 180 tablet 1   No facility-administered medications prior to  visit.     PE: Blood pressure 94/64, pulse 98, temperature 97.8 F (36.6 C), temperature source Oral, height 5\' 3"  (1.6 m), weight 180 lb 12.8 oz (82 kg), last menstrual period 06/12/2017, SpO2 100 %. Body mass index is 32.03 kg/m.  Gen: Alert, well appearing.  Patient is oriented to person, place, time, and situation. AFFECT: pleasant, lucid thought and speech. No further exam today.  IMPRESSION AND PLAN:  Obesity.  Difficult to tell if she has PCOS, primarily b/c of the inherent difficulty in making this diagnosis in ANYONE, not just her. She seems to have the weight gain part of the diagnostic criteria---this could be from hyperinsulinism that has not resulted in any glucose abnormalities yet, OR it could simply be due to caloric burning is << caloric intake. I do not think any further blood work is indicated today. I told her I recommend she take the OCP as prescribed by Dr. Ralph Dowdy and f/u with him as recommended. I also referred her to a bariatric health/wellness clinic to see Dr. Renard Matter wt loss specialist (non-surgical). Pt was agreeable to this plan.  Spent 45 min with pt today, with much more than 50% of this time spent in counseling and care coordination regarding the above problems.  An After Visit Summary was printed and given to the patient.  FOLLOW UP:  Return in about 3 months (around 10/08/2017) for f/u chronic pain syndrome.  Signed:  Santiago Bumpers, MD           07/10/2017

## 2017-07-16 ENCOUNTER — Other Ambulatory Visit: Payer: Self-pay | Admitting: Family Medicine

## 2017-07-16 NOTE — Telephone Encounter (Signed)
She should not need more of this med yet: starting 1/9, she was supposed to take 1 tab per day for 15d, then increase to 2 tabs daily for the next 15d.  I gave her #45 pills --this should last exactly 30d.  She should not need RF of this med until 08/01/17.  Pls see if pt has been taking med incorrectly.-thx

## 2017-07-16 NOTE — Telephone Encounter (Signed)
[facility]  RF request for lamotrigine LOV: 07/02/17 Next ov: None Last written: 07/02/17 #45 w/ 0RF  Please advise. Thanks.

## 2017-07-21 NOTE — Telephone Encounter (Signed)
Patient is not out of medication yet.  She states that her pharmacy text her to be put on "auto refill" and she selected yes.  She states she just started the 2 tablets daily Saturday, 07/19/17.

## 2017-07-21 NOTE — Telephone Encounter (Signed)
Left message for pt to call back  °

## 2017-07-30 ENCOUNTER — Other Ambulatory Visit: Payer: Self-pay | Admitting: Family Medicine

## 2017-07-31 ENCOUNTER — Other Ambulatory Visit: Payer: Self-pay | Admitting: Family Medicine

## 2017-07-31 MED ORDER — LAMOTRIGINE 100 MG PO TABS: 100.0000 mg | ORAL_TABLET | Freq: Every day | ORAL | 0 refills | Status: DC

## 2017-07-31 NOTE — Telephone Encounter (Signed)
[facility] [place]  RF request for lamotrigine LOV: 07/02/17 Next ov: None Last written: 07/02/17 #45 w/ 0RF  Please advise. Thanks.   

## 2017-07-31 NOTE — Telephone Encounter (Signed)
Notify pt that I will send in new rx for the 50mg  lamotrigine tabs and she will take 2 per day now. She needs to f/u with me in office for recheck of this in 3-4 weeks.-thx

## 2017-07-31 NOTE — Telephone Encounter (Signed)
Pt advised and voiced understanding.   

## 2017-07-31 NOTE — Telephone Encounter (Signed)
Correction: I eRx'd 100 mg lamictal and she will take one tab daily.  Still need to see her in 3-4 weeks in office.-thx

## 2017-08-07 DIAGNOSIS — M9901 Segmental and somatic dysfunction of cervical region: Secondary | ICD-10-CM | POA: Diagnosis not present

## 2017-08-07 DIAGNOSIS — G44219 Episodic tension-type headache, not intractable: Secondary | ICD-10-CM | POA: Diagnosis not present

## 2017-08-07 DIAGNOSIS — M9903 Segmental and somatic dysfunction of lumbar region: Secondary | ICD-10-CM | POA: Diagnosis not present

## 2017-08-09 ENCOUNTER — Encounter: Payer: Self-pay | Admitting: Family Medicine

## 2017-08-11 ENCOUNTER — Telehealth: Payer: Self-pay

## 2017-08-11 NOTE — Telephone Encounter (Signed)
Returned call to patient reiterating the message given from Dr. Milinda CaveMcGowen. Patient verbalized understanding.

## 2017-08-11 NOTE — Telephone Encounter (Signed)
The lamictal effect can potentially be decreased by estrogens--not likely to any clinically significant level. The progesterone part of your oral contraceptive could be slightly decreased by the lamictal--again, likely not to any clinically significant extent.  May be the safest to use condoms for intercourse just to make sure. -thx

## 2017-08-27 ENCOUNTER — Other Ambulatory Visit: Payer: Self-pay | Admitting: Family Medicine

## 2017-08-27 MED ORDER — LAMOTRIGINE 100 MG PO TABS: 100.0000 mg | ORAL_TABLET | Freq: Every day | ORAL | 0 refills | Status: DC

## 2017-08-27 NOTE — Telephone Encounter (Signed)
[facility]  RF request for lamotrigine LOV: 07/02/17 Next ov: None Last written: 07/31/17 #30 w/ 0RF  Please advise. Thanks.

## 2017-08-27 NOTE — Telephone Encounter (Signed)
I'll rx 1 mo supply of the lamictal 100mg , but I need to see her for office f/u in 3-4 wks before I can rx anymore of this med.-thx

## 2017-08-28 NOTE — Telephone Encounter (Signed)
Left message for pt to call back.   Okay for PEC to advise pt and schedule apt. 

## 2017-08-30 ENCOUNTER — Other Ambulatory Visit: Payer: Self-pay | Admitting: Family Medicine

## 2017-09-01 ENCOUNTER — Encounter: Payer: Self-pay | Admitting: Family Medicine

## 2017-09-01 NOTE — Telephone Encounter (Signed)
Per mychart message (09/01/17) pt is aware she needs apt for more refills.

## 2017-09-02 DIAGNOSIS — M9903 Segmental and somatic dysfunction of lumbar region: Secondary | ICD-10-CM | POA: Diagnosis not present

## 2017-09-02 DIAGNOSIS — G44219 Episodic tension-type headache, not intractable: Secondary | ICD-10-CM | POA: Diagnosis not present

## 2017-09-02 DIAGNOSIS — M9901 Segmental and somatic dysfunction of cervical region: Secondary | ICD-10-CM | POA: Diagnosis not present

## 2017-09-04 DIAGNOSIS — G44219 Episodic tension-type headache, not intractable: Secondary | ICD-10-CM | POA: Diagnosis not present

## 2017-09-04 DIAGNOSIS — M9901 Segmental and somatic dysfunction of cervical region: Secondary | ICD-10-CM | POA: Diagnosis not present

## 2017-09-04 DIAGNOSIS — M9903 Segmental and somatic dysfunction of lumbar region: Secondary | ICD-10-CM | POA: Diagnosis not present

## 2017-09-09 ENCOUNTER — Other Ambulatory Visit: Payer: Self-pay | Admitting: Family Medicine

## 2017-09-09 NOTE — Telephone Encounter (Signed)
Trazodone RF'd. Pt needs f/u visit with me in 1 mo to f/u chronic pain and bipolar disorder.-thx

## 2017-09-09 NOTE — Telephone Encounter (Signed)
Patient notified and verbalized understanding. She will call back to schedule appointment when she gets her schedule.

## 2017-09-09 NOTE — Telephone Encounter (Signed)
Okay to fill? 

## 2017-09-21 ENCOUNTER — Other Ambulatory Visit: Payer: Self-pay | Admitting: Family Medicine

## 2017-09-22 DIAGNOSIS — A020 Salmonella enteritis: Secondary | ICD-10-CM

## 2017-09-22 HISTORY — DX: Salmonella enteritis: A02.0

## 2017-09-23 DIAGNOSIS — M9901 Segmental and somatic dysfunction of cervical region: Secondary | ICD-10-CM | POA: Diagnosis not present

## 2017-09-23 DIAGNOSIS — M5442 Lumbago with sciatica, left side: Secondary | ICD-10-CM | POA: Diagnosis not present

## 2017-09-23 DIAGNOSIS — M9903 Segmental and somatic dysfunction of lumbar region: Secondary | ICD-10-CM | POA: Diagnosis not present

## 2017-09-26 ENCOUNTER — Ambulatory Visit: Payer: 59 | Admitting: Adult Health

## 2017-09-27 ENCOUNTER — Other Ambulatory Visit: Payer: Self-pay | Admitting: Family Medicine

## 2017-09-29 ENCOUNTER — Ambulatory Visit: Payer: 59 | Admitting: Family Medicine

## 2017-09-29 ENCOUNTER — Encounter: Payer: Self-pay | Admitting: Family Medicine

## 2017-09-29 VITALS — BP 125/77 | HR 88 | Temp 98.1°F | Resp 16 | Ht 65.0 in | Wt 174.0 lb

## 2017-09-29 DIAGNOSIS — F3171 Bipolar disorder, in partial remission, most recent episode hypomanic: Secondary | ICD-10-CM

## 2017-09-29 DIAGNOSIS — M5442 Lumbago with sciatica, left side: Secondary | ICD-10-CM | POA: Diagnosis not present

## 2017-09-29 DIAGNOSIS — G8929 Other chronic pain: Secondary | ICD-10-CM

## 2017-09-29 DIAGNOSIS — G894 Chronic pain syndrome: Secondary | ICD-10-CM

## 2017-09-29 DIAGNOSIS — Z79899 Other long term (current) drug therapy: Secondary | ICD-10-CM

## 2017-09-29 MED ORDER — OXYCODONE-ACETAMINOPHEN 5-325 MG PO TABS: 1.0000 | ORAL_TABLET | Freq: Four times a day (QID) | ORAL | 0 refills | Status: DC | PRN

## 2017-09-29 MED ORDER — HYDROCODONE-ACETAMINOPHEN 5-325 MG PO TABS: 1.0000 | ORAL_TABLET | Freq: Four times a day (QID) | ORAL | 0 refills | Status: DC | PRN

## 2017-09-29 MED ORDER — CYCLOBENZAPRINE HCL 10 MG PO TABS: ORAL_TABLET | ORAL | 5 refills | Status: DC

## 2017-09-29 NOTE — Progress Notes (Signed)
OFFICE VISIT  09/29/2017   CC:  Chief Complaint  Patient presents with  . Follow-up    RCI     HPI:    Patient is a 27 y.o. Caucasian female who presents for f/u chronic pain mgmt and bipolar disorder.  Indication for chronic opioid: chronic LBP with left L5 radiculopathy. Medication and dose: Vicodin 5/325 and percocet 5/325. # pills per month: 90 vic, 30 perc Last UDS date: 12/19/16 Opioid Treatment Agreement signed (Y/N): Y, 07/02/17. Opioid Treatment Agreement last reviewed with patient:  today NCCSRS reviewed this encounter (include red flags):  today, no red flags. She fell 2 weeks ago onto her bottom onto a step, went to chiropracter and he adjusted her and it got better.  Still pain in LB diffusely, goes down L leg. Seeing chiropractor age every 2 weeks. Takes vicodin, 1 per night hs.  Lately having to take percocet 1-2 times per week.  When hurting more after  Is taking flexeril 1 hs prn, 3-4 times per week avg.  Stays in bed a lot due to pain.  Bipolar disorder: started lamictal and she was to continue counseling with Dr. Dewayne Hatch. Is trying to get appt with Dr. Dewayne Hatch soon. I also kept her on prozac 40mg  qd and ativan 0.5mg  tid prn. HOwever, she is currently not taking prozac--says this is due to the benefit of the lamictal. We have titrated her lamictal up to 100 mg qd. Says lamictal has done wonders for her.  Better mood stabilization and better anxiety. No side effects from the lamictal.   Past Medical History:  Diagnosis Date  . Bipolar disorder [facility])    r/o OCD per psychiatrist 03/2016.  Also, psychologist eval revealed suspected Bipolar II, depressed phase 04/16/2016.  [name] Chronic pain syndrome   . GAD (generalized anxiety disorder)   . Infectious mononucleosis 2007   ?whooping cough, too??  . Lumbosacral radiculopathy at L5 2015   [facility]; then 2nd opinion with Dr. Shelle Iron at [facility], where she got ESI left L5-S1  11/2013 with moderate improvement.  Saw neurosurgeon 01/2016: surgery NOT recommended.  PT initiated.  . Migraine syndrome   . Obesity, Class I, BMI 30-34.9   . PCOS (polycystic ovarian syndrome)    Her GYN, Dr. Ralph Dowdy, is working her up for this.  . Pre-syncope 2016   ? POTS.  Holter showed mostly NSR, occ PACs, one run of nonsustained atrial tachy.    Past Surgical History:  Procedure Laterality Date  . WISDOM TOOTH EXTRACTION      Outpatient Medications Prior to Visit  Medication Sig Dispense Refill  . Adapalene-Benzoyl Peroxide (EPIDUO FORTE) 0.3-2.5 % GEL Apply 1 drop topically at bedtime. 45 g 11  . fluticasone (FLONASE) 50 MCG/ACT nasal spray Place into both nostrils daily as needed.     [name] ibuprofen (ADVIL,MOTRIN) 800 MG tablet Take 800 mg by mouth every 8 (eight) hours as needed.  0  . loratadine (CLARITIN) 10 MG tablet Take 10 mg by mouth daily as needed.     [name] LORazepam (ATIVAN) 0.5 MG tablet Take 1 tablet (0.5 mg total) by mouth every 8 (eight) hours as needed for anxiety. Per psychiatrist 60 tablet 1  . norgestimate-ethinyl estradiol (ORTHO-CYCLEN,SPRINTEC,PREVIFEM) 0.25-35 MG-MCG tablet Take by mouth.    . rizatriptan (MAXALT) 10 MG tablet Take 1 tablet (10 mg total) by mouth as needed for migraine. May repeat in 2 hours if needed 10 tablet 1  . cyclobenzaprine (FLEXERIL) 10  MG tablet TAKE 1 TABLET BY MOUTH THREE TIMES A DAY AS NEEDED FOR MUSCLE SPASMS 60 tablet 5  . HYDROcodone-acetaminophen (NORCO/VICODIN) 5-325 MG tablet Take 1-2 tablets by mouth every 6 (six) hours as needed for moderate pain. 90 tablet 0  . lamoTRIgine (LAMICTAL) 100 MG tablet Take 1 tablet (100 mg total) by mouth daily. 30 tablet 0  . oxyCODONE-acetaminophen (PERCOCET/ROXICET) 5-325 MG tablet Take 1-2 tablets by mouth every 6 (six) hours as needed for severe pain. 60 tablet 0  . traZODone (DESYREL) 50 MG tablet TAKE 1 TO 2 TABLETS AT BEDTIME AS NEEDED FOR INSOMNIA (Patient not taking: Reported on 09/29/2017)  180 tablet 1  . FLUoxetine (PROZAC) 20 MG capsule TAKE 2 CAPSULES BY MOUTH EVERY DAY (Patient not taking: Reported on 09/29/2017) 180 capsule 0   No facility-administered medications prior to visit.     Allergies  Allergen Reactions  . Fish-Derived Products     Shell fish make her sick    ROS As per HPI  PE: Blood pressure 125/77, pulse 88, temperature 98.1 F (36.7 C), temperature source Oral, resp. rate 16, height 5\' 5"  (1.651 m), weight 174 lb (78.9 kg), SpO2 100 %. Wt Readings from Last 2 Encounters:  09/29/17 174 lb (78.9 kg)  07/10/17 180 lb 12.8 oz (82 kg)    Gen: alert, oriented x 4, affect pleasant.  Lucid thinking and conversation noted. HEENT: PERRLA, EOMI.   Neck: no LAD, mass, or thyromegaly. CV: RRR, no m/r/g LUNGS: CTA bilat, nonlabored. NEURO: no tremor or tics noted on observation.  Coordination intact. CN 2-12 grossly intact bilaterally, strength 5/5 in all extremeties.  No ataxia.   LABS:    Chemistry      Component Value Date/Time   NA 138 10/20/2014 1137   K 4.8 10/20/2014 1137   CL 104 10/20/2014 1137   CO2 30 10/20/2014 1137   BUN 12 10/20/2014 1137   CREATININE 0.59 10/20/2014 1137      Component Value Date/Time   CALCIUM 9.2 10/20/2014 1137   ALKPHOS 48 10/20/2014 1137   AST 15 10/20/2014 1137   ALT 13 10/20/2014 1137   BILITOT 0.3 10/20/2014 1137       IMPRESSION AND PLAN:  1) Chronic pain syndrome: chronic bilat LBP with L radiculopathy. Pain control adequate. CSC UTD. Will do UDS today: most recent hydroc 2 nights ago, oxycod last night, loraz 5-6 d/a, flexeril > 1wk ago. Rx for vicodin 5/325, 1-2 q6h prn, #90 rx handed to pt today. Rx for percocet 5/325, 1-2 q6h prn SEVERE pain, #30, rx handed to pt today. Flexeril RF'd today as well.  2) Bipolar disorder in near complete remission. GAD improved. She'll continue on lamictal 100mg  qd and she has lorazepam 0.5mg  to take 1 tab q8h prn. She will stay off the fluoxetine for  now. Encouraged ongoing counseling with Dr. Dewayne HatchMendelson.  An After Visit Summary was printed and given to the patient.  FOLLOW UP: Return in about 3 months (around 12/29/2017) for routine chronic illness f/u.  Signed:  Santiago BumpersPhil McGowen, MD           09/29/2017

## 2017-09-29 NOTE — Telephone Encounter (Signed)
[facility]  RF request for lamotrigine LOV: 07/10/17 Next ov:  Today 09/29/17 Last written: 08/27/17 #30 w/ 0RF  Please advise. Thanks.

## 2017-09-30 ENCOUNTER — Ambulatory Visit: Payer: 59 | Admitting: Obstetrics & Gynecology

## 2017-09-30 ENCOUNTER — Encounter: Payer: Self-pay | Admitting: Obstetrics & Gynecology

## 2017-09-30 ENCOUNTER — Ambulatory Visit: Payer: 59 | Admitting: Adult Health

## 2017-09-30 VITALS — BP 100/70 | HR 96 | Wt 174.0 lb

## 2017-09-30 DIAGNOSIS — N939 Abnormal uterine and vaginal bleeding, unspecified: Secondary | ICD-10-CM | POA: Diagnosis not present

## 2017-09-30 DIAGNOSIS — N946 Dysmenorrhea, unspecified: Secondary | ICD-10-CM

## 2017-09-30 MED ORDER — DESOGESTREL-ETHINYL ESTRADIOL 0.15-0.02/0.01 MG (21/5) PO TABS: 1.0000 | ORAL_TABLET | Freq: Every day | ORAL | 11 refills | Status: DC

## 2017-09-30 MED ORDER — KETOROLAC TROMETHAMINE 10 MG PO TABS
10.0000 mg | ORAL_TABLET | Freq: Three times a day (TID) | ORAL | 0 refills | Status: DC | PRN
Start: 2017-09-30 — End: 2018-01-19

## 2017-09-30 NOTE — Progress Notes (Signed)
Chief Complaint  Patient presents with  . discuss birth control    painful periods      27 y.o. No obstetric history on file. Patient's last menstrual period was 09/15/2017. The current method of family planning is none.  Outpatient Encounter Medications as of 09/30/2017  Medication Sig  . cyclobenzaprine (FLEXERIL) 10 MG tablet TAKE 1 TABLET BY MOUTH THREE TIMES A DAY AS NEEDED FOR MUSCLE SPASMS  . fluticasone (FLONASE) 50 MCG/ACT nasal spray Place into both nostrils daily as needed.   . loratadine (CLARITIN) 10 MG tablet Take 10 mg by mouth daily as needed.   . [DISCONTINUED] Adapalene-Benzoyl Peroxide (EPIDUO FORTE) 0.3-2.5 % GEL Apply 1 drop topically at bedtime. (Patient not taking: Reported on 10/03/2017)  . [DISCONTINUED] HYDROcodone-acetaminophen (NORCO/VICODIN) 5-325 MG tablet Take 1-2 tablets by mouth every 6 (six) hours as needed for moderate pain.  . [DISCONTINUED] ibuprofen (ADVIL,MOTRIN) 800 MG tablet Take 800 mg by mouth every 8 (eight) hours as needed.  . [DISCONTINUED] lamoTRIgine (LAMICTAL) 100 MG tablet TAKE 1 TABLET BY MOUTH EVERY DAY  . [DISCONTINUED] LORazepam (ATIVAN) 0.5 MG tablet Take 1 tablet (0.5 mg total) by mouth every 8 (eight) hours as needed for anxiety. Per psychiatrist  . [DISCONTINUED] norgestimate-ethinyl estradiol (ORTHO-CYCLEN,SPRINTEC,PREVIFEM) 0.25-35 MG-MCG tablet Take by mouth.  . [DISCONTINUED] oxyCODONE-acetaminophen (PERCOCET/ROXICET) 5-325 MG tablet Take 1-2 tablets by mouth every 6 (six) hours as needed for severe pain.  . [DISCONTINUED] rizatriptan (MAXALT) 10 MG tablet Take 1 tablet (10 mg total) by mouth as needed for migraine. May repeat in 2 hours if needed  . [DISCONTINUED] desogestrel-ethinyl estradiol (KARIVA,AZURETTE,MIRCETTE) 0.15-0.02/0.01 MG (21/5) tablet Take 1 tablet by mouth daily. (Patient not taking: Reported on 10/03/2017)  . [DISCONTINUED] ketorolac (TORADOL) 10 MG tablet Take 1 tablet (10 mg total) by mouth every 8  (eight) hours as needed. (Patient not taking: Reported on 01/19/2018)  . [DISCONTINUED] traZODone (DESYREL) 50 MG tablet TAKE 1 TO 2 TABLETS AT BEDTIME AS NEEDED FOR INSOMNIA (Patient not taking: Reported on 09/29/2017)   No facility-administered encounter medications on file as of 09/30/2017.     Subjective Pt with history of irregula menses, PCOS irregular ovulatory cycling Spotting irregularly with moderate to severe cramps Worsening with time Some dyspareunia Does not want kids now    Past Medical History:  Diagnosis Date  . Bipolar disorder [facility])    r/o OCD per psychiatrist 03/2016.  Also, psychologist eval revealed suspected Bipolar II, depressed phase 04/16/2016.  [name] Chronic pain syndrome   . Dysmenorrhea    mgmt per Dr. Despina Hidden (01/2018)  . GAD (generalized anxiety disorder)   . Infectious mononucleosis 2007   ?whooping cough, too??  . Lumbosacral radiculopathy at L5 2015   [facility]; then 2nd opinion with Dr. Shelle Iron at [facility], where she got ESI left L5-S1 11/2013 with moderate improvement.  Saw neurosurgeon 01/2016: surgery NOT recommended.  PT initiated.  . Migraine syndrome   . Obesity, Class I, BMI 30-34.9   . PCOS (polycystic ovarian syndrome)    Her GYN, Dr. Ralph Dowdy, is working her up for this.  . Pre-syncope 2016   ? POTS.  Holter showed mostly NSR, occ PACs, one run of nonsustained atrial tachy.  . Salmonella enteritis 09/2017    Past Surgical History:  Procedure Laterality Date  . WISDOM TOOTH EXTRACTION      OB History    Gravida  0   Para  0   Term  0   Preterm  0   AB  0   Living  0     SAB  0   TAB  0   Ectopic  0   Multiple  0   Live Births  0           Allergies  Allergen Reactions  . Fish-Derived Products     Shell fish make her sick    Social History   Socioeconomic History  . Marital status: Married    Spouse name: Not on file  . Number of children: Not on file  . Years of education: Not  on file  . Highest education level: Not on file  Occupational History  . Not on file  Social Needs  . Financial resource strain: Not on file  . Food insecurity:    Worry: Not on file    Inability: Not on file  . Transportation needs:    Medical: Not on file    Non-medical: Not on file  Tobacco Use  . Smoking status: Never Smoker  . Smokeless tobacco: Never Used  Substance and Sexual Activity  . Alcohol use: No    Comment: 04-05-2016 per pt no  . Drug use: No    Comment: 04-05-2016 per pt no   . Sexual activity: Yes    Birth control/protection: Pill  Lifestyle  . Physical activity:    Days per week: Not on file    Minutes per session: Not on file  . Stress: Not on file  Relationships  . Social connections:    Talks on phone: Not on file    Gets together: Not on file    Attends religious service: Not on file    Active member of club or organization: Not on file    Attends meetings of clubs or organizations: Not on file    Relationship status: Not on file  Other Topics Concern  . Not on file  Social History Narrative   Married, no children.   College: [employer].   Occupation: Used to work at [employer] [place]   [place]oved [place] in [place].   No T/A/Ds.   No exercise.    Family History  Problem Relation Age of Onset  . Cancer Maternal Aunt   . Alcohol abuse Maternal Grandfather   . Bipolar disorder Father   . Bipolar disorder Sister     Medications:       Current Outpatient Medications:  .  cyclobenzaprine (FLEXERIL) 10 MG tablet, TAKE 1 TABLET BY MOUTH THREE TIMES A DAY AS NEEDED FOR MUSCLE SPASMS, Disp: 60 tablet, Rfl: 5 .  fluticasone (FLONASE) 50 MCG/ACT nasal spray, Place into both nostrils daily as needed. , Disp: , Rfl:  .  loratadine (CLARITIN) 10 MG tablet, Take 10 mg by mouth daily as needed. , Disp: , Rfl:  .  etonogestrel-ethinyl estradiol (NUVARING) 0.12-0.015 MG/24HR vaginal ring, Insert vaginally and leave in place for 3 consecutive weeks, then  remove for 1 week., Disp: 1 each, Rfl: 12 .  HYDROcodone-acetaminophen (NORCO/VICODIN) 5-325 MG tablet, Take 1-2 tablets by mouth every 6 (six) hours as needed for moderate pain., Disp: 90 tablet, Rfl: 0 .  lamoTRIgine (LAMICTAL) 100 MG tablet, 1 tab po bid, Disp: 60 tablet, Rfl: 1 .  LORazepam (ATIVAN) 0.5 MG tablet, Take 1 tablet (0.5 mg total) by mouth every 8 (eight) hours as needed for anxiety., Disp: 60 tablet, Rfl: 1 .  norgestrel-ethinyl estradiol (LO/OVRAL,CRYSELLE) 0.3-30 MG-MCG tablet, Take 1 tablet  by mouth daily., Disp: 1 Package, Rfl: 11 .  oxyCODONE-acetaminophen (PERCOCET/ROXICET) 5-325 MG tablet, Take 1-2 tablets by mouth every 6 (six) hours as needed for severe pain., Disp: 30 tablet, Rfl: 0 .  rizatriptan (MAXALT) 10 MG tablet, TAKE 1 TABLET (10 MG TOTAL) BY MOUTH AS NEEDED FOR MIGRAINE. MAY REPEAT IN 2 HOURS IF NEEDED, Disp: 10 tablet, Rfl: 1  Objective Blood pressure 100/70, pulse 96, weight 174 lb (78.9 kg), last menstrual period 09/15/2017.  General WDWN female NAD Vulva:  normal appearing vulva with no masses, tenderness or lesions Vagina:  normal mucosa, no discharge Cervix:  no cervical motion tenderness and no lesions Uterus:  normal size, contour, position, consistency, mobility, non-tender Adnexa: ovaries:present,  normal adnexa in size, nontender and no masses   Pertinent ROS No burning with urination, frequency or urgency No nausea, vomiting or diarrhea Nor fever chills or other constitutional symptoms   Labs or studies     Impression Diagnoses this Encounter::   ICD-10-CM   1. Dysmenorrhea N94.6   2. Abnormal uterine bleeding (AUB) N93.9     Established relevant diagnosis(es):   Plan/Recommendations: Meds ordered this encounter  Medications  . DISCONTD: desogestrel-ethinyl estradiol (KARIVA,AZURETTE,MIRCETTE) 0.15-0.02/0.01 MG (21/5) tablet    Sig: Take 1 tablet by mouth daily.    Dispense:  1 Package    Refill:  11  . DISCONTD: ketorolac  (TORADOL) 10 MG tablet    Sig: Take 1 tablet (10 mg total) by mouth every 8 (eight) hours as needed.    Dispense:  15 tablet    Refill:  0    Labs or Scans Ordered: No orders of the defined types were placed in this encounter.   Management:: OCP + NSAID therapy for both cycle managemtn and cycle control  Follow up Return if symptoms worsen or fail to improve.       All questions were answered.

## 2017-10-01 DIAGNOSIS — E860 Dehydration: Secondary | ICD-10-CM | POA: Diagnosis not present

## 2017-10-01 DIAGNOSIS — J111 Influenza due to unidentified influenza virus with other respiratory manifestations: Secondary | ICD-10-CM | POA: Diagnosis not present

## 2017-10-01 DIAGNOSIS — K529 Noninfective gastroenteritis and colitis, unspecified: Secondary | ICD-10-CM | POA: Diagnosis not present

## 2017-10-01 DIAGNOSIS — I951 Orthostatic hypotension: Secondary | ICD-10-CM | POA: Diagnosis not present

## 2017-10-02 LAB — PAIN MGMT, PROFILE 8 W/CONF, U
6 Acetylmorphine: NEGATIVE ng/mL (ref <10)
AMPHETAMINES: NEGATIVE ng/mL (ref <500)
Alcohol Metabolites: NEGATIVE ng/mL (ref <500)
Benzodiazepines: NEGATIVE ng/mL (ref <100)
Buprenorphine, Urine: NEGATIVE ng/mL (ref <5)
COCAINE METABOLITE: NEGATIVE ng/mL (ref <150)
CREATININE: 221.9 mg/dL
MDMA: NEGATIVE ng/mL (ref <500)
Marijuana Metabolite: NEGATIVE ng/mL (ref <20)
Noroxycodone: 586 ng/mL — ABNORMAL HIGH (ref <50)
OXIDANT: NEGATIVE ug/mL (ref <200)
OXYMORPHONE: 870 ng/mL — AB (ref <50)
Opiates: NEGATIVE ng/mL (ref <100)
Oxycodone: 121 ng/mL — ABNORMAL HIGH (ref <50)
Oxycodone: POSITIVE ng/mL — AB (ref <100)
PH: 6.53 (ref 4.5–9.0)

## 2017-10-03 ENCOUNTER — Ambulatory Visit (INDEPENDENT_AMBULATORY_CARE_PROVIDER_SITE_OTHER): Payer: 59 | Admitting: Family Medicine

## 2017-10-03 ENCOUNTER — Encounter: Payer: Self-pay | Admitting: Family Medicine

## 2017-10-03 ENCOUNTER — Encounter: Payer: Self-pay | Admitting: Obstetrics & Gynecology

## 2017-10-03 ENCOUNTER — Ambulatory Visit (INDEPENDENT_AMBULATORY_CARE_PROVIDER_SITE_OTHER): Payer: 59

## 2017-10-03 VITALS — BP 104/71 | HR 118 | Temp 99.8°F | Resp 16 | Ht 65.0 in | Wt 171.5 lb

## 2017-10-03 DIAGNOSIS — R1030 Lower abdominal pain, unspecified: Secondary | ICD-10-CM

## 2017-10-03 DIAGNOSIS — R8290 Unspecified abnormal findings in urine: Secondary | ICD-10-CM

## 2017-10-03 DIAGNOSIS — K529 Noninfective gastroenteritis and colitis, unspecified: Secondary | ICD-10-CM | POA: Diagnosis not present

## 2017-10-03 DIAGNOSIS — E860 Dehydration: Secondary | ICD-10-CM

## 2017-10-03 DIAGNOSIS — R112 Nausea with vomiting, unspecified: Secondary | ICD-10-CM

## 2017-10-03 DIAGNOSIS — R197 Diarrhea, unspecified: Secondary | ICD-10-CM | POA: Diagnosis not present

## 2017-10-03 LAB — COMPREHENSIVE METABOLIC PANEL
ALBUMIN: 3.6 g/dL (ref 3.5–5.2)
ALK PHOS: 30 U/L — AB (ref 39–117)
ALT: 9 U/L (ref 0–35)
AST: 13 U/L (ref 0–37)
BILIRUBIN TOTAL: 0.3 mg/dL (ref 0.2–1.2)
BUN: 3 mg/dL — AB (ref 6–23)
CO2: 28 mEq/L (ref 19–32)
CREATININE: 0.72 mg/dL (ref 0.40–1.20)
Calcium: 8.3 mg/dL — ABNORMAL LOW (ref 8.4–10.5)
Chloride: 105 mEq/L (ref 96–112)
GFR: 102.69 mL/min (ref >60.00)
Glucose, Bld: 101 mg/dL — ABNORMAL HIGH (ref 70–99)
Potassium: 3.5 mEq/L (ref 3.5–5.1)
SODIUM: 140 meq/L (ref 135–145)
TOTAL PROTEIN: 6.3 g/dL (ref 6.0–8.3)

## 2017-10-03 LAB — CBC WITH DIFFERENTIAL/PLATELET
BASOS PCT: 0.3 % (ref 0.0–3.0)
Basophils Absolute: 0 10*3/uL (ref 0.0–0.1)
EOS ABS: 0 10*3/uL (ref 0.0–0.7)
EOS PCT: 0 % (ref 0.0–5.0)
HCT: 36.9 % (ref 36.0–46.0)
HEMOGLOBIN: 12.9 g/dL (ref 12.0–15.0)
LYMPHS PCT: 13.6 % (ref 12.0–46.0)
Lymphs Abs: 0.6 10*3/uL — ABNORMAL LOW (ref 0.7–4.0)
MCHC: 35 g/dL (ref 30.0–36.0)
MCV: 89.2 fl (ref 78.0–100.0)
MONO ABS: 0.3 10*3/uL (ref 0.1–1.0)
Monocytes Relative: 7.9 % (ref 3.0–12.0)
Neutro Abs: 3.3 10*3/uL (ref 1.4–7.7)
Neutrophils Relative %: 78.2 % — ABNORMAL HIGH (ref 43.0–77.0)
Platelets: 173 10*3/uL (ref 150.0–400.0)
RBC: 4.13 Mil/uL (ref 3.87–5.11)
RDW: 11.8 % (ref 11.5–15.5)
WBC: 4.3 10*3/uL (ref 4.0–10.5)

## 2017-10-03 LAB — POC URINALSYSI DIPSTICK (AUTOMATED)
BILIRUBIN UA: NEGATIVE
Glucose, UA: NEGATIVE
Ketones, UA: NEGATIVE
Leukocytes, UA: NEGATIVE
NITRITE UA: POSITIVE
PH UA: 6 (ref 5.0–8.0)
Protein, UA: 30
Spec Grav, UA: 1.02 (ref 1.010–1.025)
Urobilinogen, UA: 0.2 E.U./dL

## 2017-10-03 LAB — POCT URINE PREGNANCY: Preg Test, Ur: NEGATIVE

## 2017-10-03 MED ORDER — DIPHENOXYLATE-ATROPINE 2.5-0.025 MG PO TABS: 2.0000 | ORAL_TABLET | Freq: Four times a day (QID) | ORAL | 0 refills | Status: DC | PRN

## 2017-10-03 MED ORDER — PROMETHAZINE HCL 12.5 MG PO TABS: ORAL_TABLET | ORAL | 0 refills | Status: DC

## 2017-10-03 MED ORDER — LEVOFLOXACIN 500 MG PO TABS: 500.0000 mg | ORAL_TABLET | Freq: Every day | ORAL | 0 refills | Status: DC

## 2017-10-03 NOTE — Progress Notes (Signed)
OFFICE VISIT  10/03/2017   CC:  Chief Complaint  Patient presents with  . Abdominal Pain    cramping, nausea, diarrhea   HPI:    Patient is a 27 y.o. Caucasian female who presents accompanied by her boyfriend for multiple GI complaints.  Had GYN appt 3 d/a.  She ate [place] salad at [employer] after this---about 3 hours later developed HA and mid epigastric pain.  Bloating.  Then started with diarrhea and diffuse abd pain/cramping, which has continued.  +N/V.  Fatigued.   The [place].  Was told she was dehydrated and she got 3L IVF. Blood and urine tested. Pt can't recall results of blood tests, no records available to me at this time. Had Tm 101 early in the illness x 1--no fever since--w/out cough or ST---ED MD rx'd her tamiflu anyway and she has been taking this. Per pt report, the ER MD then told her she had a kidney infection--cephalexin rx'd and she has been taking this.  No dysuria, no urinary urgency or frequency.  No flank/cva pain.  No radiating flank-to-groin pain. Pt has less nausea b/c she was rx'd zofran in ED, but has continued to have loose, urgent, watery BMs.   Every time she eats she has diarrhea.  She is drinking pedialyte and gatorade well last couple of days. Lomotil has helped decrease the frequency and urgency of her BMs, but abd pain in periumbilical region and lower abd region diffusely continue.  Constant "rumble" and gassiness in lower abdomen, worsens when she has loose BM. Estimated # of loose/watery BMs : yesterday 10-15 when on lomotil, 20 or so per day the prior 2 days. She has chronic LBP unchanged.  No CVA pain.  Prominent HA still.   No family members or friends with any similar sx's.  No hx of pyelonephritis in chart, but pt states she has had pyelo before and did not have the symptoms she currently has.  No hx of kidney stones. Noncontrast CT abd/pelv in the ED in the setting of R sided abd and flank/back  pain NEG.    Past Medical History:  Diagnosis Date  . Bipolar disorder [facility])    r/o OCD per psychiatrist 03/2016.  Also, psychologist eval revealed suspected Bipolar II, depressed phase 04/16/2016.  [name]. Chronic pain syndrome   . GAD (generalized anxiety disorder)   . Infectious mononucleosis 2007   ?whooping cough, too??  . Lumbosacral radiculopathy at L5 2015   [facility]ullo [facility]; then [facility]nd [facility], where she got ESI left L5-S1 11/2013 with moderate improvement.  Saw neurosurgeon 01/2016: surgery NOT recommended.  PT initiated.  . Migraine syndrome   . Obesity, Class I, BMI 30-34.9   . PCOS (polycystic ovarian syndrome)    Her GYN, Dr. Ralph DowdyBuist, is working her up for this.  . Pre-syncope 2016   ? POTS.  Holter showed mostly NSR, occ PACs, one run of nonsustained atrial tachy.    Past Surgical History:  Procedure Laterality Date  . WISDOM TOOTH EXTRACTION      Outpatient Medications Prior to Visit  Medication Sig Dispense Refill  . cyclobenzaprine (FLEXERIL) 10 MG tablet TAKE 1 TABLET BY MOUTH THREE TIMES A DAY AS NEEDED FOR MUSCLE SPASMS 60 tablet 5  . fluticasone (FLONASE) 50 MCG/ACT nasal spray Place into both nostrils daily as needed.     [name]. HYDROcodone-acetaminophen (NORCO/VICODIN) 5-325 MG tablet  Take 1-2 tablets by mouth every 6 (six) hours as needed for moderate pain. 90 tablet 0  . ibuprofen (ADVIL,MOTRIN) 800 MG tablet Take 800 mg by mouth every 8 (eight) hours as needed.  0  . lamoTRIgine (LAMICTAL) 100 MG tablet TAKE 1 TABLET BY MOUTH EVERY DAY 30 tablet 2  . loratadine (CLARITIN) 10 MG tablet Take 10 mg by mouth daily as needed.     [name] LORazepam (ATIVAN) 0.5 MG tablet Take 1 tablet (0.5 mg total) by mouth every 8 (eight) hours as needed for anxiety. Per psychiatrist 60 tablet 1  . norgestimate-ethinyl estradiol (ORTHO-CYCLEN,SPRINTEC,PREVIFEM) 0.25-35 MG-MCG tablet Take by mouth.    . oxyCODONE-acetaminophen  (PERCOCET/ROXICET) 5-325 MG tablet Take 1-2 tablets by mouth every 6 (six) hours as needed for severe pain. 30 tablet 0  . rizatriptan (MAXALT) 10 MG tablet Take 1 tablet (10 mg total) by mouth as needed for migraine. May repeat in 2 hours if needed 10 tablet 1  . cephALEXin (KEFLEX) 500 MG capsule     . diphenoxylate-atropine (LOMOTIL) 2.5-0.025 MG tablet     . ondansetron (ZOFRAN-ODT) 4 MG disintegrating tablet     . oseltamivir (TAMIFLU) 75 MG capsule     . Adapalene-Benzoyl Peroxide (EPIDUO FORTE) 0.3-2.5 % GEL Apply 1 drop topically at bedtime. (Patient not taking: Reported on 10/03/2017) 45 g 11  . desogestrel-ethinyl estradiol (KARIVA,AZURETTE,MIRCETTE) 0.15-0.02/0.01 MG (21/5) tablet Take 1 tablet by mouth daily. (Patient not taking: Reported on 10/03/2017) 1 Package 11  . ketorolac (TORADOL) 10 MG tablet Take 1 tablet (10 mg total) by mouth every 8 (eight) hours as needed. 15 tablet 0  . traZODone (DESYREL) 50 MG tablet TAKE 1 TO 2 TABLETS AT BEDTIME AS NEEDED FOR INSOMNIA (Patient not taking: Reported on 09/29/2017) 180 tablet 1   No facility-administered medications prior to visit.     Allergies  Allergen Reactions  . Fish-Derived Products     Shell fish make her sick    ROS As per HPI  PE: Blood pressure 104/71, pulse (!) 118, temperature 99.8 F (37.7 C), temperature source Oral, resp. rate 16, height 5\' 5"  (1.651 m), weight 171 lb 8 oz (77.8 kg), last menstrual period 10/03/2017, SpO2 100 %. Gen: Alert, tired- appearing, NAD.  Patient is oriented to person, place, time, and situation. AFFECT: anxious, lucid thought and speech. XBM:WUXL: no injection, icteris, swelling, or exudate.  EOMI, PERRLA. Mouth: lips without lesion/swelling.  Oral mucosa pink and moist. Oropharynx without erythema, exudate, or swelling.  Neck - No masses or thyromegaly or limitation in range of motion CV: Regular, tachy to 120, no murmur. Chest is clear, no wheezing or rales. Normal symmetric air  entry throughout both lung fields. No chest wall deformities or tenderness. BACK: NO CVA tenderness to palpation or percussion. ABD: soft, nondistended.  BS hypoactive for the most part, with occasional short gurgle.  No high pitched BS or "rushes". She has mild/mod diffuse TTP all over abdomen.  No guarding or rebound.  No bruit. EXT: no clubbing, cyanosis, or edema.  Skin - no sores or suspicious lesions or rashes or color changes Neuro: CN 2-12 intact bilaterally, strength 5/5 in proximal and distal upper extremities and lower extremities bilaterally. No tremor.    No ataxia.     LABS:    Chemistry      Component Value Date/Time   NA 138 10/20/2014 1137   K 4.8 10/20/2014 1137   CL 104 10/20/2014 1137   CO2 30 10/20/2014 1137  BUN 12 10/20/2014 1137   CREATININE 0.59 10/20/2014 1137      Component Value Date/Time   CALCIUM 9.2 10/20/2014 1137   ALKPHOS 48 10/20/2014 1137   AST 15 10/20/2014 1137   ALT 13 10/20/2014 1137   BILITOT 0.3 10/20/2014 1137     Lab Results  Component Value Date   WBC 5.3 10/20/2014   HGB 13.4 10/20/2014   HCT 39.8 10/20/2014   MCV 91.5 10/20/2014   PLT 278.0 10/20/2014   CC UA today (of note, pt started menses today): large blood, nitrite +, LEU neg, 30 mg/dl protein, SG 1.610, color is orange/red.  UPT today: NEG  IMPRESSION AND PLAN:  Acute gastroenteritis, possibly bacterial from contaminated food.   At 3 days into the illness she is not improving.  She is likely still mildly dehydrated (even though she got IVF in ED, but is taking fluids PO fine.  PLAN:  Stop cephalexin. Start levofloxacin 500mg  qd x 5d. Change zofran to phenergan. Stop tamiflu. More lomotil rx'd. CBC, CMET, acute abd series today, stool collection/evaluation. UA today--> ? UTI (+ nitrite, but NEG LEU, ++ blood likely contaminant from vaginal bleeding/menses:  Her symptoms do not fit cystitis/pyelonephritis)---sent for c/s. I don't think she has a urinary  infection but levo that I rx'd should cover this if she does. Continue aggressive oral rehydration. Discussed this outpt management vs return to the ED for IVF and consideration of admission, but we all agreed on outpt management as outlined above. If significant Cr bump noted, I will then have pt go to ED this evening.  We did call [employer] in [place] to inquire about any recent complaints/suspicion of food borne illness at their restaurants and they said no.  An After Visit Summary was printed and given to the patient.  FOLLOW UP: Return in about 3 days (around 10/06/2017) for f/u GE/dehyd.  Signed:  Santiago Bumpers, MD           10/03/2017

## 2017-10-03 NOTE — Addendum Note (Signed)
Addended by: Regan RakersAY, Latonyia Lopata K on: 10/03/2017 02:30 PM   Modules accepted: Orders

## 2017-10-05 LAB — URINE CULTURE
MICRO NUMBER:: 90456148
Result:: NO GROWTH
SPECIMEN QUALITY: ADEQUATE

## 2017-10-07 ENCOUNTER — Encounter: Payer: Self-pay | Admitting: Family Medicine

## 2017-10-07 ENCOUNTER — Telehealth: Payer: Self-pay

## 2017-10-07 ENCOUNTER — Telehealth: Payer: Self-pay | Admitting: Family Medicine

## 2017-10-07 LAB — STOOL CULTURE
MICRO NUMBER: 90456164
MICRO NUMBER:: 90456159
MICRO NUMBER:: 90456161
SHIGA RESULT:: NOT DETECTED
SPECIMEN QUALITY: ADEQUATE
SPECIMEN QUALITY: ADEQUATE
SPECIMEN QUALITY:: ADEQUATE

## 2017-10-07 LAB — GIARDIA/CRYPTOSPORIDIUM (EIA)
MICRO NUMBER: 90456162
MICRO NUMBER:: 90456160
RESULT: NOT DETECTED
RESULT:: NOT DETECTED
SPECIMEN QUALITY:: ADEQUATE
SPECIMEN QUALITY:: ADEQUATE

## 2017-10-07 LAB — FECAL LACTOFERRIN, QUANT
FECAL LACTOFERRIN: POSITIVE — AB
MICRO NUMBER:: 90456163
SPECIMEN QUALITY:: ADEQUATE

## 2017-10-07 NOTE — Telephone Encounter (Signed)
[facility] call received from [facility] Requesting Notes from recent visit, labs and Demographic. This was faxed upon request by Weldon PickingKim Broome, RN.

## 2017-10-07 NOTE — Telephone Encounter (Signed)
Message was left on voice mail for patient stating that we would contact her about medication once Dr. Milinda CaveMcGowen reviews the note and that the [facility] was notified and will contact her about where she has eaten or may have come in contact with [place].

## 2017-10-07 NOTE — Telephone Encounter (Signed)
[name] with [facility] called with stat culture results from a stool sample Dr. Milinda CaveMcGowen ordered.  There is Salmonella infection in the stool sample.  I spoke with flow coordinator Lawson FiscalLori.   They were already made aware of the results and the pt had been contacted.

## 2017-10-08 ENCOUNTER — Encounter: Payer: Self-pay | Admitting: Family Medicine

## 2017-10-08 NOTE — Telephone Encounter (Signed)
Please advise. Thanks.  

## 2017-10-08 NOTE — Telephone Encounter (Signed)
Continue taking the nausea med (promethazine) and diarrhea med (lomotil) that I rx'd.  If she needs more of these meds I can rx them. Nothing further to do at this time.

## 2017-10-08 NOTE — Telephone Encounter (Signed)
Agree--we'll let the HD take care of anything like contacting [facility]. No further antibiotic is needed.-thx

## 2017-10-14 ENCOUNTER — Ambulatory Visit: Payer: Self-pay | Admitting: Family Medicine

## 2017-10-15 DIAGNOSIS — M9903 Segmental and somatic dysfunction of lumbar region: Secondary | ICD-10-CM | POA: Diagnosis not present

## 2017-10-15 DIAGNOSIS — M9901 Segmental and somatic dysfunction of cervical region: Secondary | ICD-10-CM | POA: Diagnosis not present

## 2017-10-15 DIAGNOSIS — M5442 Lumbago with sciatica, left side: Secondary | ICD-10-CM | POA: Diagnosis not present

## 2017-10-21 ENCOUNTER — Other Ambulatory Visit: Payer: Self-pay | Admitting: Family Medicine

## 2017-10-21 NOTE — Telephone Encounter (Signed)
[facility]  RF request for lamotrigine LOV: 09/29/17 Next ov: None Last written: 09/29/17 #30 w/ 2RF  Please advise. Thanks.

## 2017-10-22 NOTE — Telephone Encounter (Signed)
SW pt, she stated that she is not sure why the pharmacy sent this request. She is okay on medications for now. Okay to decline this refill.

## 2017-10-22 NOTE — Telephone Encounter (Signed)
She should have 2 RF's still. Is she requesting a 90 day supply?  I'm confused.

## 2017-11-11 ENCOUNTER — Encounter: Payer: Self-pay | Admitting: Family Medicine

## 2017-11-11 NOTE — Telephone Encounter (Signed)
Needs ov

## 2017-11-13 ENCOUNTER — Encounter: Payer: Self-pay | Admitting: Obstetrics & Gynecology

## 2017-11-17 ENCOUNTER — Encounter: Payer: Self-pay | Admitting: Obstetrics & Gynecology

## 2017-11-24 ENCOUNTER — Other Ambulatory Visit: Payer: Self-pay | Admitting: Family Medicine

## 2017-11-24 NOTE — Telephone Encounter (Signed)
RF request for lamotrigine LOV: 09/29/17 Next ov: None Last written: 09/29/17 #30 w/ 2RF  Please advise. Thanks.

## 2017-12-01 ENCOUNTER — Encounter: Payer: Self-pay | Admitting: Obstetrics & Gynecology

## 2017-12-01 ENCOUNTER — Other Ambulatory Visit: Payer: Self-pay | Admitting: Family Medicine

## 2017-12-05 ENCOUNTER — Other Ambulatory Visit: Payer: Self-pay | Admitting: Obstetrics & Gynecology

## 2017-12-05 MED ORDER — NORGESTIMATE-ETH ESTRADIOL 0.25-35 MG-MCG PO TABS: 1.0000 | ORAL_TABLET | Freq: Every day | ORAL | 11 refills | Status: DC

## 2017-12-05 MED ORDER — MEGESTROL ACETATE 40 MG PO TABS: ORAL_TABLET | ORAL | 3 refills | Status: DC

## 2017-12-08 ENCOUNTER — Encounter: Payer: Self-pay | Admitting: Obstetrics & Gynecology

## 2017-12-13 ENCOUNTER — Other Ambulatory Visit: Payer: Self-pay | Admitting: Family Medicine

## 2017-12-15 ENCOUNTER — Telehealth: Payer: Self-pay | Admitting: Family Medicine

## 2017-12-15 ENCOUNTER — Ambulatory Visit: Payer: 59 | Admitting: Clinical

## 2017-12-15 DIAGNOSIS — F3181 Bipolar II disorder: Secondary | ICD-10-CM

## 2017-12-15 NOTE — Telephone Encounter (Signed)
Patient would like to increase her bipolar and depression medications. She would like an appointment however there are no 30 minute appointments avail before the end of July. Can she be worked in?

## 2017-12-15 NOTE — Telephone Encounter (Signed)
RF request for trazodone  LOV: 09/29/17 Next ov: None Last written: 09/09/17 #180 w/ 1RF  Please advise. Thanks.

## 2017-12-15 NOTE — Telephone Encounter (Signed)
SW pt, apt made for 01/05/18 at 3:15pm.

## 2017-12-15 NOTE — Telephone Encounter (Signed)
Pt should have 1 RF on this med for 90 day supply.

## 2017-12-16 NOTE — Telephone Encounter (Signed)
SW pt, she stated that this was an auto request, she hasn't needed to take this Rx for about 2 months.

## 2017-12-21 ENCOUNTER — Encounter: Payer: Self-pay | Admitting: Obstetrics & Gynecology

## 2017-12-22 ENCOUNTER — Other Ambulatory Visit: Payer: Self-pay | Admitting: Obstetrics & Gynecology

## 2017-12-22 MED ORDER — NORGESTREL-ETHINYL ESTRADIOL 0.3-30 MG-MCG PO TABS: 1.0000 | ORAL_TABLET | Freq: Every day | ORAL | 11 refills | Status: DC

## 2017-12-23 ENCOUNTER — Other Ambulatory Visit: Payer: Self-pay | Admitting: Emergency Medicine

## 2017-12-23 ENCOUNTER — Other Ambulatory Visit: Payer: Self-pay | Admitting: Family Medicine

## 2017-12-23 MED ORDER — TRAZODONE HCL 50 MG PO TABS: ORAL_TABLET | ORAL | 1 refills | Status: DC

## 2018-01-01 ENCOUNTER — Encounter: Payer: Self-pay | Admitting: Obstetrics & Gynecology

## 2018-01-02 ENCOUNTER — Encounter: Payer: Self-pay | Admitting: Family Medicine

## 2018-01-02 MED ORDER — HYDROCODONE-ACETAMINOPHEN 5-325 MG PO TABS: 1.0000 | ORAL_TABLET | Freq: Four times a day (QID) | ORAL | 0 refills | Status: DC | PRN

## 2018-01-02 NOTE — Telephone Encounter (Signed)
Please advise. Thanks.  

## 2018-01-02 NOTE — Telephone Encounter (Signed)
#  30 vicodin eRx'd.

## 2018-01-02 NOTE — Telephone Encounter (Signed)
Remind her that her pain contract states that this cannot be done. However, given her history of responsible use of her meds I'll do a rx for 2 week supply of ONE of her pain meds---pls see which one she wants me to rx. Also, I must see her in office for f/u chronic pain before any FURTHER pain med rx's. -thx

## 2018-01-05 ENCOUNTER — Ambulatory Visit: Payer: Self-pay | Admitting: Family Medicine

## 2018-01-08 ENCOUNTER — Encounter: Payer: Self-pay | Admitting: Family Medicine

## 2018-01-13 DIAGNOSIS — M9903 Segmental and somatic dysfunction of lumbar region: Secondary | ICD-10-CM | POA: Diagnosis not present

## 2018-01-13 DIAGNOSIS — M9901 Segmental and somatic dysfunction of cervical region: Secondary | ICD-10-CM | POA: Diagnosis not present

## 2018-01-13 DIAGNOSIS — M5442 Lumbago with sciatica, left side: Secondary | ICD-10-CM | POA: Diagnosis not present

## 2018-01-19 ENCOUNTER — Encounter: Payer: Self-pay | Admitting: Family Medicine

## 2018-01-19 ENCOUNTER — Ambulatory Visit: Payer: 59 | Admitting: Family Medicine

## 2018-01-19 VITALS — BP 98/67 | HR 86 | Temp 98.8°F | Resp 16 | Ht 65.0 in | Wt 162.2 lb

## 2018-01-19 DIAGNOSIS — M5442 Lumbago with sciatica, left side: Secondary | ICD-10-CM | POA: Diagnosis not present

## 2018-01-19 DIAGNOSIS — E663 Overweight: Secondary | ICD-10-CM | POA: Diagnosis not present

## 2018-01-19 DIAGNOSIS — G894 Chronic pain syndrome: Secondary | ICD-10-CM | POA: Diagnosis not present

## 2018-01-19 DIAGNOSIS — F3175 Bipolar disorder, in partial remission, most recent episode depressed: Secondary | ICD-10-CM | POA: Diagnosis not present

## 2018-01-19 DIAGNOSIS — G8929 Other chronic pain: Secondary | ICD-10-CM

## 2018-01-19 MED ORDER — LAMOTRIGINE 100 MG PO TABS: ORAL_TABLET | ORAL | 1 refills | Status: DC

## 2018-01-19 MED ORDER — HYDROCODONE-ACETAMINOPHEN 5-325 MG PO TABS: 1.0000 | ORAL_TABLET | Freq: Four times a day (QID) | ORAL | 0 refills | Status: DC | PRN

## 2018-01-19 MED ORDER — LORAZEPAM 0.5 MG PO TABS: 0.5000 mg | ORAL_TABLET | Freq: Three times a day (TID) | ORAL | 1 refills | Status: DC | PRN

## 2018-01-19 MED ORDER — OXYCODONE-ACETAMINOPHEN 5-325 MG PO TABS: 1.0000 | ORAL_TABLET | Freq: Four times a day (QID) | ORAL | 0 refills | Status: DC | PRN

## 2018-01-19 NOTE — Telephone Encounter (Signed)
Take metamucil (fiber supplement) every day as directed on the packaging.  Also, take generic for senakot S, 2 tabs every day---may increase to 2 tabs twice a day after a week if not getting improved bowel pattern.  Use over the counter miralax, 1 capful with 6-8 oz of water 1-2 times per day as needed. May have to use this twice a day every day.  --thx

## 2018-01-19 NOTE — Progress Notes (Signed)
OFFICE VISIT  01/19/2018   CC:  Chief Complaint  Patient presents with  . Follow-up    RCI   HPI:    Patient is a 27 y.o. Caucasian female who presents for 3 mo f/u chronic pain syndrome.  Indication for chronic opioid: chronic LBP with left L5 radiculopathy. Medication and dose: Vicodin 5/325 and percocet 5/325. # pills per month: 90 vic, 30 perc I just recent rx'd vicodin 5/325, #30  Last UDS date: 09/29/17 on 01/02/18.  She had called and explained by she was unable to come in on time for her 3 mo f/u, so I agreed to rx short term rx to get her through until today (#30 vicodin).  (Last rx's filled other than this one were vicodin #90 and percocet #30, both on 09/29/17). Opioid Treatment Agreement signed (Y/N):  Y, 07/02/17. Opioid Treatment Agreement last reviewed with patient: Today. NCCS[facility] reviewed this encounter (include red flags): today, no red flags.  Pain: "about the same", had a 3 d period of acute worsening of pain, went to chiropracter and this helped. Taking naproxen 440 qd.  Vicodin use varies: avg 1 per night. Oxycodone use is about once a week.  Flexeril usually 1 tab at night.  She also had bipolar disorder: has responded well to lamictal.  Dose has been titrated up to 100 mg qd. Ativan 0.5mg  tid prn for additional anxiety. Counseling with Dr. Dewayne Hatch: last saw her mid June, due to see her again 01/2018. Pt feels like this is going well still but she notes that in late afternoon for the last 6 wks or so she feels her mood dipping into depression/overwhelmed/dwelling on negativity.    Taking 0.5mg  tid prn lorazepam consistently and this helps her anxiety pretty well.     Past Medical History:  Diagnosis Date  . Bipolar disorder [facility]    r/o OCD per psychiatrist 03/2016.  Also, psychologist eval revealed suspected Bipolar II, depressed phase 04/16/2016.  [name] Chronic pain syndrome   . GAD (generalized anxiety disorder)   . Infectious mononucleosis 2007   ?whooping  cough, too??  . Lumbosacral radiculopathy at L5 2015   [facility]; then 2nd opinion with Dr. Shelle Iron at [facility], where she got ESI left L5-S1 11/2013 with moderate improvement.  Saw neurosurgeon 01/2016: surgery NOT recommended.  PT initiated.  . Migraine syndrome   . Obesity, Class I, BMI 30-34.9   . PCOS (polycystic ovarian syndrome)    Her GYN, Dr. Ralph Dowdy, is working her up for this.  . Pre-syncope 2016   ? POTS.  Holter showed mostly NSR, occ PACs, one run of nonsustained atrial tachy.  . Salmonella enteritis 09/2017    Past Surgical History:  Procedure Laterality Date  . WISDOM TOOTH EXTRACTION      Outpatient Medications Prior to Visit  Medication Sig Dispense Refill  . cyclobenzaprine (FLEXERIL) 10 MG tablet TAKE 1 TABLET BY MOUTH THREE TIMES A DAY AS NEEDED FOR MUSCLE SPASMS 60 tablet 5  . fluticasone (FLONASE) 50 MCG/ACT nasal spray Place into both nostrils daily as needed.     . loratadine (CLARITIN) 10 MG tablet Take 10 mg by mouth daily as needed.     . norgestrel-ethinyl estradiol (LO/OVRAL,CRYSELLE) 0.3-30 MG-MCG tablet Take 1 tablet by mouth daily. 1 Package 11  . rizatriptan (MAXALT) 10 MG tablet TAKE 1 TABLET (10 MG TOTAL) BY MOUTH AS NEEDED FOR MIGRAINE. MAY REPEAT IN 2 HOURS IF NEEDED 10 tablet 1  .  HYDROcodone-acetaminophen (NORCO/VICODIN) 5-325 MG tablet Take 1-2 tablets by mouth every 6 (six) hours as needed for moderate pain. 30 tablet 0  . lamoTRIgine (LAMICTAL) 100 MG tablet TAKE 1 TABLET BY MOUTH EVERY DAY 30 tablet 1  . LORazepam (ATIVAN) 0.5 MG tablet Take 1 tablet (0.5 mg total) by mouth every 8 (eight) hours as needed for anxiety. Per psychiatrist 60 tablet 1  . oxyCODONE-acetaminophen (PERCOCET/ROXICET) 5-325 MG tablet Take 1-2 tablets by mouth every 6 (six) hours as needed for severe pain. 30 tablet 0  . Adapalene-Benzoyl Peroxide (EPIDUO FORTE) 0.3-2.5 % GEL Apply 1 drop topically at bedtime. (Patient not taking: Reported on  10/03/2017) 45 g 11  . desogestrel-ethinyl estradiol (KARIVA,AZURETTE,MIRCETTE) 0.15-0.02/0.01 MG (21/5) tablet Take 1 tablet by mouth daily. (Patient not taking: Reported on 10/03/2017) 1 Package 11  . diphenoxylate-atropine (LOMOTIL) 2.5-0.025 MG tablet Take 2 tablets by mouth 4 (four) times daily as needed for diarrhea or loose stools. (Patient not taking: Reported on 01/19/2018) 30 tablet 0  . ibuprofen (ADVIL,MOTRIN) 800 MG tablet Take 800 mg by mouth every 8 (eight) hours as needed.  0  . ketorolac (TORADOL) 10 MG tablet Take 1 tablet (10 mg total) by mouth every 8 (eight) hours as needed. (Patient not taking: Reported on 01/19/2018) 15 tablet 0  . levofloxacin (LEVAQUIN) 500 MG tablet Take 1 tablet (500 mg total) by mouth daily. (Patient not taking: Reported on 01/19/2018) 5 tablet 0  . megestrol (MEGACE) 40 MG tablet 3 tablets a day for 5 days, 2 tablets a day for 5 days then 1 tablet daily (Patient not taking: Reported on 01/19/2018) 45 tablet 3  . norgestimate-ethinyl estradiol (ORTHO-CYCLEN,SPRINTEC,PREVIFEM) 0.25-35 MG-MCG tablet Take by mouth.    . norgestimate-ethinyl estradiol (ORTHO-CYCLEN,SPRINTEC,PREVIFEM) 0.25-35 MG-MCG tablet Take 1 tablet by mouth daily. (Patient not taking: Reported on 01/19/2018) 1 Package 11  . promethazine (PHENERGAN) 12.5 MG tablet 1-2 tabs po q6h prn nausea/vomiting (Patient not taking: Reported on 01/19/2018) 30 tablet 0  . traZODone (DESYREL) 50 MG tablet TAKE 1 TO 2 TABLETS AT BEDTIME AS NEEDED FOR INSOMNIA (Patient not taking: Reported on 01/19/2018) 180 tablet 1   No facility-administered medications prior to visit.     Allergies  Allergen Reactions  . Fish-Derived Products     Shell fish make her sick    ROS As per HPI  PE: Blood pressure 98/67, pulse 86, temperature 98.8 F (37.1 C), temperature source Oral, resp. rate 16, height 5\' 5"  (1.651 m), weight 162 lb 4 oz (73.6 kg), last menstrual period 01/01/2018, SpO2 100 %. Wt Readings from Last 2  Encounters:  01/19/18 162 lb 4 oz (73.6 kg)  10/03/17 171 lb 8 oz (77.8 kg)    Gen: alert, oriented x 4, affect pleasant.  Lucid thinking and conversation noted. HEENT: PERRLA, EOMI.   Neck: no LAD, mass, or thyromegaly. CV: RRR, no m/r/g LUNGS: CTA bilat, nonlabored. NEURO: no tremor or tics noted on observation.  Coordination intact. CN 2-12 grossly intact bilaterally, strength 5/5 in all extremeties.  No ataxia. LE strength 5/5 prox/dist bilat.  LABS:    Chemistry      Component Value Date/Time   NA 140 10/03/2017 1352   K 3.5 10/03/2017 1352   CL 105 10/03/2017 1352   CO2 28 10/03/2017 1352   BUN 3 (L) 10/03/2017 1352   CREATININE 0.72 10/03/2017 1352      Component Value Date/Time   CALCIUM 8.3 (L) 10/03/2017 1352   ALKPHOS 30 (L) 10/03/2017 1352  AST 13 10/03/2017 1352   ALT 9 10/03/2017 1352   BILITOT 0.3 10/03/2017 1352     Lab Results  Component Value Date   WBC 4.3 10/03/2017   HGB 12.9 10/03/2017   HCT 36.9 10/03/2017   MCV 89.2 10/03/2017   PLT 173.0 10/03/2017    IMPRESSION AND PLAN:  1) Bipolar d/o: some decline in afternoons consistently --? Coincidental with starting different oral contraceptive (these can decrease lamictal levels) vs natural disease downturn. Will increase lamictal to 100 mg qAM and add 100 mg qPM. Continue ativan 0.5mg  tid prn.  Rx for #60, RF x 1 eRx'd tdoay.  2) Chronic pain syndrome: lumbar spondylosis with chronic left sided radiculopathy pain. The current medical regimen is effective;  continue present plan and medications. Vicodin 5/325, 1-2 q6h prn-->first line pain med, #90 eRx'd today. Percocet 5/325, 1-2 q6h prn-->second line, only for severe pain.  #30 eRx'd today. CSC and UDS UTD.  An After Visit Summary was printed and given to the patient.  FOLLOW UP: Return in about 6 weeks (around 03/02/2018) for f/u mood.  Signed:  Santiago Bumpers, MD           01/19/2018

## 2018-01-19 NOTE — Telephone Encounter (Signed)
Please advise. Thanks.  

## 2018-01-21 ENCOUNTER — Other Ambulatory Visit: Payer: Self-pay | Admitting: Family Medicine

## 2018-01-21 ENCOUNTER — Encounter: Payer: Self-pay | Admitting: Obstetrics & Gynecology

## 2018-01-28 ENCOUNTER — Ambulatory Visit: Payer: 59 | Admitting: Clinical

## 2018-01-29 ENCOUNTER — Other Ambulatory Visit: Payer: Self-pay

## 2018-01-29 ENCOUNTER — Ambulatory Visit: Payer: 59 | Admitting: Obstetrics & Gynecology

## 2018-01-29 ENCOUNTER — Encounter: Payer: Self-pay | Admitting: Obstetrics & Gynecology

## 2018-01-29 VITALS — BP 101/72 | HR 94 | Ht 64.0 in | Wt 163.0 lb

## 2018-01-29 DIAGNOSIS — N938 Other specified abnormal uterine and vaginal bleeding: Secondary | ICD-10-CM

## 2018-01-29 DIAGNOSIS — N946 Dysmenorrhea, unspecified: Secondary | ICD-10-CM

## 2018-01-29 MED ORDER — ETONOGESTREL-ETHINYL ESTRADIOL 0.12-0.015 MG/24HR VA RING
VAGINAL_RING | VAGINAL | 12 refills | Status: DC
Start: 2018-01-29 — End: 2018-07-31

## 2018-01-29 NOTE — Progress Notes (Signed)
Chief Complaint  Patient presents with  . Follow-up    birth control pill      27 y.o. G0P0000 Patient's last menstrual period was 01/01/2018. The current method of family planning is OCP (estrogen/progesterone).  Outpatient Encounter Medications as of 01/29/2018  Medication Sig  . cyclobenzaprine (FLEXERIL) 10 MG tablet TAKE 1 TABLET BY MOUTH THREE TIMES A DAY AS NEEDED FOR MUSCLE SPASMS  . fluticasone (FLONASE) 50 MCG/ACT nasal spray Place into both nostrils daily as needed.   [name] HYDROcodone-acetaminophen (NORCO/VICODIN) 5-325 MG tablet Take 1-2 tablets by mouth every 6 (six) hours as needed for moderate pain.  [name] lamoTRIgine (LAMICTAL) 100 MG tablet 1 tab po bid  . loratadine (CLARITIN) 10 MG tablet Take 10 mg by mouth daily as needed.   [name] LORazepam (ATIVAN) 0.5 MG tablet Take 1 tablet (0.5 mg total) by mouth every 8 (eight) hours as needed for anxiety.  . norgestrel-ethinyl estradiol (LO/OVRAL,CRYSELLE) 0.3-30 MG-MCG tablet Take 1 tablet by mouth daily.  [name] oxyCODONE-acetaminophen (PERCOCET/ROXICET) 5-325 MG tablet Take 1-2 tablets by mouth every 6 (six) hours as needed for severe pain.  . rizatriptan (MAXALT) 10 MG tablet TAKE 1 TABLET (10 MG TOTAL) BY MOUTH AS NEEDED FOR MIGRAINE. MAY REPEAT IN 2 HOURS IF NEEDED  . etonogestrel-ethinyl estradiol (NUVARING) 0.12-0.015 MG/24HR vaginal ring Insert vaginally and leave in place for 3 consecutive weeks, then remove for 1 week.   No facility-administered encounter medications on file as of 01/29/2018.     Subjective Susan Chandler in for follow up We have had some difficulty stabilizing her endometrium We are doing this for dysmenorrhea Megace completely stopped the bleeding She is having brown spotting now on Lo Ovral only Past Medical History:  Diagnosis Date  . Bipolar disorder [facility])    r/o OCD per psychiatrist 03/2016.  Also, psychologist eval revealed suspected Bipolar II, depressed phase 04/16/2016.  [name] Chronic pain  syndrome   . GAD (generalized anxiety disorder)   . Infectious mononucleosis 2007   ?whooping cough, too??  . Lumbosacral radiculopathy at L5 2015   [facility]. [facility] at [facility]; then 2nd opinion with Dr. Shelle Iron at [facility], where she got ESI left L5-S1 11/2013 with moderate improvement.  Saw neurosurgeon 01/2016: surgery NOT recommended.  PT initiated.  . Migraine syndrome   . Obesity, Class I, BMI 30-34.9   . PCOS (polycystic ovarian syndrome)    Her GYN, Dr. Ralph Dowdy, is working her up for this.  . Pre-syncope 2016   ? POTS.  Holter showed mostly NSR, occ PACs, one run of nonsustained atrial tachy.  . Salmonella enteritis 09/2017    Past Surgical History:  Procedure Laterality Date  . WISDOM TOOTH EXTRACTION      OB History    Gravida  0   Para  0   Term  0   Preterm  0   AB  0   Living  0     SAB  0   TAB  0   Ectopic  0   Multiple  0   Live Births  0           Allergies  Allergen Reactions  . Fish-Derived Products     Shell fish make her sick    Social History   Socioeconomic History  . Marital status: Married    Spouse name: Not on file  . Number of children: Not on file  . Years of education: Not on file  .  Highest education level: Not on file  Occupational History  . Not on file  Social Needs  . Financial resource strain: Not on file  . Food insecurity:    Worry: Not on file    Inability: Not on file  . Transportation needs:    Medical: Not on file    Non-medical: Not on file  Tobacco Use  . Smoking status: Never Smoker  . Smokeless tobacco: Never Used  Substance and Sexual Activity  . Alcohol use: No    Comment: 04-05-2016 per pt no  . Drug use: No    Comment: 04-05-2016 per pt no   . Sexual activity: Yes    Birth control/protection: Pill  Lifestyle  . Physical activity:    Days per week: Not on file    Minutes per session: Not on file  . Stress: Not on file  Relationships  . Social connections:    Talks on  phone: Not on file    Gets together: Not on file    Attends religious service: Not on file    Active member of club or organization: Not on file    Attends meetings of clubs or organizations: Not on file    Relationship status: Not on file  Other Topics Concern  . Not on file  Social History Narrative   Married, no children.   College: [employer].   Occupation: Used to work at [employer] in [employer]   Moved to [place], [place] in 2015.   No T/A/Ds.   No exercise.    Family History  Problem Relation Age of Onset  . Cancer Maternal Aunt   . Alcohol abuse Maternal Grandfather   . Bipolar disorder Father   . Bipolar disorder Sister     Medications:       Current Outpatient Medications:  .  cyclobenzaprine (FLEXERIL) 10 MG tablet, TAKE 1 TABLET BY MOUTH THREE TIMES A DAY AS NEEDED FOR MUSCLE SPASMS, Disp: 60 tablet, Rfl: 5 .  fluticasone (FLONASE) 50 MCG/ACT nasal spray, Place into both nostrils daily as needed. , Disp: , Rfl:  .  HYDROcodone-acetaminophen (NORCO/VICODIN) 5-325 MG tablet, Take 1-2 tablets by mouth every 6 (six) hours as needed for moderate pain., Disp: 90 tablet, Rfl: 0 .  lamoTRIgine (LAMICTAL) 100 MG tablet, 1 tab po bid, Disp: 60 tablet, Rfl: 1 .  loratadine (CLARITIN) 10 MG tablet, Take 10 mg by mouth daily as needed. , Disp: , Rfl:  .  LORazepam (ATIVAN) 0.5 MG tablet, Take 1 tablet (0.5 mg total) by mouth every 8 (eight) hours as needed for anxiety., Disp: 60 tablet, Rfl: 1 .  norgestrel-ethinyl estradiol (LO/OVRAL,CRYSELLE) 0.3-30 MG-MCG tablet, Take 1 tablet by mouth daily., Disp: 1 Package, Rfl: 11 .  oxyCODONE-acetaminophen (PERCOCET/ROXICET) 5-325 MG tablet, Take 1-2 tablets by mouth every 6 (six) hours as needed for severe pain., Disp: 30 tablet, Rfl: 0 .  rizatriptan (MAXALT) 10 MG tablet, TAKE 1 TABLET (10 MG TOTAL) BY MOUTH AS NEEDED FOR MIGRAINE. MAY REPEAT IN 2 HOURS IF NEEDED, Disp: 10 tablet, Rfl: 1 .  etonogestrel-ethinyl estradiol (NUVARING) 0.12-0.015  MG/24HR vaginal ring, Insert vaginally and leave in place for 3 consecutive weeks, then remove for 1 week., Disp: 1 each, Rfl: 12  Objective Blood pressure 101/72, pulse 94, height 5\' 4"  (1.626 m), weight 163 lb (73.9 kg), last menstrual period 01/01/2018.  Gen WDWN NAD  Pertinent ROS No burning with urination, frequency or urgency No nausea, vomiting or diarrhea Nor fever chills or other constitutional  symptoms   Labs or studies     Impression Diagnoses this Encounter::   ICD-10-CM   1. Dysmenorrhea N94.6   2. DUB (dysfunctional uterine bleeding) N93.8     Established relevant diagnosis(es):   Plan/Recommendations: Meds ordered this encounter  Medications  . etonogestrel-ethinyl estradiol (NUVARING) 0.12-0.015 MG/24HR vaginal ring    Sig: Insert vaginally and leave in place for 3 consecutive weeks, then remove for 1 week.    Dispense:  1 each    Refill:  12    Labs or Scans Ordered: No orders of the defined types were placed in this encounter.   Management:: Supplement with nuvaring for 1 month Hopefully after that the Lo Ovral will be enough alone  Follow up Return in about 9 months (around 10/30/2018) for yearly, with Dr Despina HiddenEure.        Face to face time:  15 minutes  Greater than 50% of the visit time was spent in counseling and coordination of care with the patient.  The summary and outline of the counseling and care coordination is summarized in the note above.   All questions were answered.

## 2018-02-05 ENCOUNTER — Encounter: Payer: Self-pay | Admitting: Family Medicine

## 2018-02-14 ENCOUNTER — Other Ambulatory Visit: Payer: Self-pay | Admitting: Family Medicine

## 2018-02-17 ENCOUNTER — Encounter: Payer: Self-pay | Admitting: Family Medicine

## 2018-02-25 ENCOUNTER — Encounter: Payer: Self-pay | Admitting: Family Medicine

## 2018-02-27 DIAGNOSIS — M9903 Segmental and somatic dysfunction of lumbar region: Secondary | ICD-10-CM | POA: Diagnosis not present

## 2018-02-27 DIAGNOSIS — M9901 Segmental and somatic dysfunction of cervical region: Secondary | ICD-10-CM | POA: Diagnosis not present

## 2018-02-27 DIAGNOSIS — M5442 Lumbago with sciatica, left side: Secondary | ICD-10-CM | POA: Diagnosis not present

## 2018-03-19 ENCOUNTER — Other Ambulatory Visit: Payer: Self-pay | Admitting: Family Medicine

## 2018-03-19 NOTE — Telephone Encounter (Signed)
RF request for lamotrigine LOV: 01/19/18 Next ov: None Last written: 01/19/18 #60 w/ 1RF  Please advise. Thanks.

## 2018-04-22 ENCOUNTER — Ambulatory Visit: Payer: 59 | Admitting: Family Medicine

## 2018-04-22 ENCOUNTER — Encounter: Payer: Self-pay | Admitting: Family Medicine

## 2018-04-22 VITALS — BP 105/75 | HR 108 | Temp 98.8°F | Resp 20 | Ht 64.0 in | Wt 154.6 lb

## 2018-04-22 DIAGNOSIS — K2900 Acute gastritis without bleeding: Secondary | ICD-10-CM | POA: Diagnosis not present

## 2018-04-22 DIAGNOSIS — R1084 Generalized abdominal pain: Secondary | ICD-10-CM | POA: Diagnosis not present

## 2018-04-22 LAB — BASIC METABOLIC PANEL
BUN: 8 mg/dL (ref 6–23)
CO2: 27 mEq/L (ref 19–32)
CREATININE: 0.77 mg/dL (ref 0.40–1.20)
Calcium: 9.7 mg/dL (ref 8.4–10.5)
Chloride: 106 mEq/L (ref 96–112)
GFR: 94.66 mL/min (ref >60.00)
Glucose, Bld: 89 mg/dL (ref 70–99)
Potassium: 5 mEq/L (ref 3.5–5.1)
SODIUM: 141 meq/L (ref 135–145)

## 2018-04-22 LAB — CBC
HEMATOCRIT: 43.5 % (ref 36.0–46.0)
HEMOGLOBIN: 14.8 g/dL (ref 12.0–15.0)
MCHC: 34.2 g/dL (ref 30.0–36.0)
MCV: 91.4 fl (ref 78.0–100.0)
Platelets: 318 10*3/uL (ref 150.0–400.0)
RBC: 4.76 Mil/uL (ref 3.87–5.11)
RDW: 12.3 % (ref 11.5–15.5)
WBC: 5.4 10*3/uL (ref 4.0–10.5)

## 2018-04-22 MED ORDER — PROMETHAZINE HCL 12.5 MG PO TABS: 12.5000 mg | ORAL_TABLET | Freq: Three times a day (TID) | ORAL | 0 refills | Status: DC | PRN

## 2018-04-22 MED ORDER — DICYCLOMINE HCL 20 MG PO TABS: 20.0000 mg | ORAL_TABLET | Freq: Three times a day (TID) | ORAL | 0 refills | Status: DC

## 2018-04-22 NOTE — Patient Instructions (Addendum)
Try OTC prilosec for next 2 weeks.  Prescribed bentyl to take before meals for next 2 days as discussed and then you can taper off if cramping is no longer an issues. Phenergan prescribed for nausea.   Clear liquids next 24- 48 hours. Soups, jello, water, Pedialyte, G2 etc. You need to make sure you are taking in enough fluids that your urine is clear to pale yellow.  Avoid high sugar or diary until symptoms resolve.     Gastritis, Adult Gastritis is swelling (inflammation) of the stomach. When you have this condition, you can have these problems (symptoms):  Pain in your stomach.  A burning feeling in your stomach.  Feeling sick to your stomach (nauseous).  Throwing up (vomiting).  Feeling too full after you eat.  It is important to get help for this condition. Without help, your stomach can bleed, and you can get sores (ulcers) in your stomach. Follow these instructions at home:  Take over-the-counter and prescription medicines only as told by your doctor.  If you were prescribed an antibiotic medicine, take it as told by your doctor. Do not stop taking it even if you start to feel better.  Drink enough fluid to keep your pee (urine) clear or pale yellow.  Instead of eating big meals, eat small meals often. Contact a health care provider if:  Your problems get worse.  Your problems go away and then come back. Get help right away if:  You throw up blood or something that looks like coffee grounds.  You have black or dark red poop (stools).  You cannot keep fluids down.  Your stomach pain gets worse.  You have a fever.  You do not feel better after 1 week. This information is not intended to replace advice given to you by your health care provider. Make sure you discuss any questions you have with your health care provider. Document Released: 11/27/2007 Document Revised: 02/07/2016 Document Reviewed: 03/04/2015 Elsevier Interactive Patient Education  [employer].

## 2018-04-22 NOTE — Progress Notes (Signed)
Susan Chandler , 1990-11-09, 27 y.o., female MRN: [id number] Patient Care Team    Relationship Specialty Notifications Start End  McGowen, Maryjean Morn, MD PCP - General Family Medicine  10/18/13   Jene Every, MD Consulting Physician Orthopedic Surgery  12/21/13   Neysa Hotter, MD Consulting Physician Psychiatry  04/05/16   Ernestina Penna, MD Consulting Physician Obstetrics and Gynecology  07/02/17     Chief Complaint  Patient presents with  . Abdominal Pain    diarrhea,fatigue x 2 days     Subjective: Pt presents for an OV with complaints of diarrhea of 2 days duration.  Associated symptoms include fatigue, nausea and abd cramping. She reports > 10 loose stools for 2 days , that has started to taper off today. However, she started with abdominal cramping today. Decreased appetite but not vomiting.  Pt denies exposure  to insects, recent travel, under prepared foods, antibiotics  or sick contacts. Pt has tried nothing to ease their symptoms.   Depression screen [facility] 2/9 09/30/2017 03/25/2017 12/19/2016  Decreased Interest 1 1 0  Down, Depressed, Hopeless 2 2 1   PHQ - 2 Score 3 3 1   Altered sleeping 2 3 2   Tired, decreased energy 1 1 1   Change in appetite 1 2 1   Feeling bad or failure about yourself  2 2 0  Trouble concentrating 1 0 0  Moving slowly or fidgety/restless 1 0 0  Suicidal thoughts 0 0 0  PHQ-9 Score 11 11 5   Difficult doing work/chores Very difficult (No Data) -    Allergies  Allergen Reactions  . Fish-Derived Products     Shell fish make her sick   Social History   Tobacco Use  . Smoking status: Never Smoker  . Smokeless tobacco: Never Used  Substance Use Topics  . Alcohol use: No    Comment: 04-05-2016 per pt no   Past Medical History:  Diagnosis Date  . Bipolar disorder [facility])    r/o OCD per psychiatrist 03/2016.  Also, psychologist eval revealed suspected Bipolar II, depressed phase 04/16/2016.  [name] Chronic pain syndrome   . Dysmenorrhea    mgmt per Dr.  Despina Hidden (01/2018)  . GAD (generalized anxiety disorder)   . Infectious mononucleosis 2007   ?whooping cough, too??  . Lumbosacral radiculopathy at L5 2015   [facility]; then 2nd opinion with Dr. Shelle Iron at [facility] ortho, where she got ESI left L5-S1 11/2013 with moderate improvement.  Saw neurosurgeon 01/2016: surgery NOT recommended.  PT initiated.  . Migraine syndrome   . Obesity, Class I, BMI 30-34.9   . PCOS (polycystic ovarian syndrome)    Her GYN, Dr. Ralph Dowdy, is working her up for this.  . Pre-syncope 2016   ? POTS.  Holter showed mostly NSR, occ PACs, one run of nonsustained atrial tachy.  . Salmonella enteritis 09/2017   Past Surgical History:  Procedure Laterality Date  . WISDOM TOOTH EXTRACTION     Family History  Problem Relation Age of Onset  . Cancer Maternal Aunt   . Alcohol abuse Maternal Grandfather   . Bipolar disorder Father   . Bipolar disorder Sister    Allergies as of 04/22/2018      Reactions   Fish-derived Products    Shell fish make her sick      Medication List        Accurate as of 04/22/18  1:28 PM. Always use your most recent med list.  cyclobenzaprine 10 MG tablet Commonly known as:  FLEXERIL TAKE 1 TABLET BY MOUTH THREE TIMES A DAY AS NEEDED FOR MUSCLE SPASMS   etonogestrel-ethinyl estradiol 0.12-0.015 MG/24HR vaginal ring Commonly known as:  NUVARING Insert vaginally and leave in place for 3 consecutive weeks, then remove for 1 week.   fluticasone 50 MCG/ACT nasal spray Commonly known as:  FLONASE Place into both nostrils daily as needed.   HYDROcodone-acetaminophen 5-325 MG tablet Commonly known as:  NORCO/VICODIN Take 1-2 tablets by mouth every 6 (six) hours as needed for moderate pain.   lamoTRIgine 100 MG tablet Commonly known as:  LAMICTAL TAKE 1 TABLET BY MOUTH TWICE A DAY   loratadine 10 MG tablet Commonly known as:  CLARITIN Take 10 mg by mouth daily as needed.   LORazepam 0.5 MG  tablet Commonly known as:  ATIVAN Take 1 tablet (0.5 mg total) by mouth every 8 (eight) hours as needed for anxiety.   oxyCODONE-acetaminophen 5-325 MG tablet Commonly known as:  PERCOCET/ROXICET Take 1-2 tablets by mouth every 6 (six) hours as needed for severe pain.   rizatriptan 10 MG tablet Commonly known as:  MAXALT TAKE 1 TABLET (10 MG TOTAL) BY MOUTH AS NEEDED FOR MIGRAINE. MAY REPEAT IN 2 HOURS IF NEEDED       All past medical history, surgical history, allergies, family history, immunizations andmedications were updated in the EMR today and reviewed under the history and medication portions of their EMR.     ROS: Negative, with the exception of above mentioned in HPI   Objective:  BP 105/75 (BP Location: Right Arm, Patient Position: Sitting, Cuff Size: Normal)   Pulse (!) 108   Temp 98.8 F (37.1 C)   Resp 20   Ht 5\' 4"  (1.626 m)   Wt 154 lb 9.6 oz (70.1 kg)   SpO2 99%   BMI 26.54 kg/m  Body mass index is 26.54 kg/m. Gen: Afebrile. No acute distress. Nontoxic in appearance, well developed, well nourished. Clammy skin HENT: AT. [place]. Bilateral TM visualized w/out erythema or fullness. Mildly dry mucous membranes, no oral lesions. Bilateral nares w/out erythema or drainage. Throat without erythema or exudates. No cough.  Eyes:Pupils Equal Round Reactive to light, Extraocular movements intact,  Conjunctiva without redness, discharge or icterus. Neck/lymp/endocrine: Supple,no lymphadenopathy CV: tachycardic.  Chest: CTAB, no wheeze or crackles.   Abd: Soft. Flat, diffuse tenderness to light touch. Non distended. BS present and normal. no Masses palpated. Mild guarding. Skin: no rashes, purpura or petechiae.  Neuro:  Normal gait. PERLA. EOMi. Alert. Oriented x3   No exam data present No results found. No results found for this or any previous visit (from the past 24 hour(s)).  Assessment/Plan: [name] is a 27 y.o. female present for OV for  Acute gastritis  without hemorrhage, unspecified gastritis type/Generalized abdominal pain - likely viral gastritis. The diarrhea is slowing down.Obtained CBC and bmp given last episode gastris she had a mild bump in cr and she did seem rather tender today.  -  Discussed the importance of hydration (water, G2, Pedialyte)--> maintain light yellow colored urine. If urine concentrated increase water consumption.  - clear liquid diet next 24-48 hours. Then ease back to routine, bland first. No diary.  - Phenergan and bentyl  prescribed.   - CBC - Basic Metabolic Panel (BMET) - f/u 1 week with PCP if symptoms not resolved, sooner if worsening.    Reviewed expectations re: course of current medical issues.  Discussed self-management of symptoms.  Outlined signs and symptoms indicating need for more acute intervention.  Patient verbalized understanding and all questions were answered.  Patient received an After-Visit Summary.    No orders of the defined types were placed in this encounter.    Note is dictated utilizing voice recognition software. Although note has been proof read prior to signing, occasional typographical errors still can be missed. If any questions arise, please do not hesitate to call for verification.   electronically signed by:  Felix Pacinienee Kuneff, DO  [facility] - [place]

## 2018-04-24 ENCOUNTER — Other Ambulatory Visit: Payer: Self-pay | Admitting: Family Medicine

## 2018-04-29 ENCOUNTER — Other Ambulatory Visit: Payer: Self-pay | Admitting: Family Medicine

## 2018-05-06 ENCOUNTER — Encounter: Payer: Self-pay | Admitting: Family Medicine

## 2018-05-06 ENCOUNTER — Ambulatory Visit: Payer: 59 | Admitting: Family Medicine

## 2018-05-08 DIAGNOSIS — M9903 Segmental and somatic dysfunction of lumbar region: Secondary | ICD-10-CM | POA: Diagnosis not present

## 2018-05-08 DIAGNOSIS — M5442 Lumbago with sciatica, left side: Secondary | ICD-10-CM | POA: Diagnosis not present

## 2018-05-08 DIAGNOSIS — M9901 Segmental and somatic dysfunction of cervical region: Secondary | ICD-10-CM | POA: Diagnosis not present

## 2018-06-02 ENCOUNTER — Other Ambulatory Visit: Payer: Self-pay | Admitting: Family Medicine

## 2018-06-02 NOTE — Telephone Encounter (Signed)
RF request for lamotrigine LOV: 04/22/18 Next ov: None Last written: 03/19/18 #60 w/ 1RF  Please advise. Thanks.

## 2018-06-03 NOTE — Telephone Encounter (Signed)
She is overdue for f/u being on her lamotrigine AND is overdue for chronic pain follow up. I'll authorize 14d supply of lamotrigine.

## 2018-06-04 NOTE — Telephone Encounter (Signed)
Left message for pt to call back  °

## 2018-06-05 DIAGNOSIS — M9903 Segmental and somatic dysfunction of lumbar region: Secondary | ICD-10-CM | POA: Diagnosis not present

## 2018-06-05 DIAGNOSIS — M5442 Lumbago with sciatica, left side: Secondary | ICD-10-CM | POA: Diagnosis not present

## 2018-06-05 DIAGNOSIS — M9901 Segmental and somatic dysfunction of cervical region: Secondary | ICD-10-CM | POA: Diagnosis not present

## 2018-06-13 DIAGNOSIS — S0340XA Sprain of jaw, unspecified side, initial encounter: Secondary | ICD-10-CM | POA: Diagnosis not present

## 2018-07-03 DIAGNOSIS — M9903 Segmental and somatic dysfunction of lumbar region: Secondary | ICD-10-CM | POA: Diagnosis not present

## 2018-07-03 DIAGNOSIS — M5442 Lumbago with sciatica, left side: Secondary | ICD-10-CM | POA: Diagnosis not present

## 2018-07-03 DIAGNOSIS — M9901 Segmental and somatic dysfunction of cervical region: Secondary | ICD-10-CM | POA: Diagnosis not present

## 2018-07-27 DIAGNOSIS — M5442 Lumbago with sciatica, left side: Secondary | ICD-10-CM | POA: Diagnosis not present

## 2018-07-27 DIAGNOSIS — M9901 Segmental and somatic dysfunction of cervical region: Secondary | ICD-10-CM | POA: Diagnosis not present

## 2018-07-27 DIAGNOSIS — M9903 Segmental and somatic dysfunction of lumbar region: Secondary | ICD-10-CM | POA: Diagnosis not present

## 2018-07-30 DIAGNOSIS — M5442 Lumbago with sciatica, left side: Secondary | ICD-10-CM | POA: Diagnosis not present

## 2018-07-30 DIAGNOSIS — M9901 Segmental and somatic dysfunction of cervical region: Secondary | ICD-10-CM | POA: Diagnosis not present

## 2018-07-30 DIAGNOSIS — M9903 Segmental and somatic dysfunction of lumbar region: Secondary | ICD-10-CM | POA: Diagnosis not present

## 2018-07-31 ENCOUNTER — Ambulatory Visit (INDEPENDENT_AMBULATORY_CARE_PROVIDER_SITE_OTHER): Payer: 59 | Admitting: Obstetrics & Gynecology

## 2018-07-31 ENCOUNTER — Other Ambulatory Visit (HOSPITAL_COMMUNITY)
Admission: RE | Admit: 2018-07-31 | Discharge: 2018-07-31 | Disposition: A | Discharge status: Home / Self Care | Payer: 59 | Source: Ambulatory Visit | Attending: Obstetrics & Gynecology | Admitting: Obstetrics & Gynecology

## 2018-07-31 ENCOUNTER — Encounter: Payer: Self-pay | Admitting: Obstetrics & Gynecology

## 2018-07-31 VITALS — BP 110/79 | HR 88 | Ht 64.0 in | Wt 149.0 lb

## 2018-07-31 DIAGNOSIS — Z01419 Encounter for gynecological examination (general) (routine) without abnormal findings: Secondary | ICD-10-CM | POA: Diagnosis not present

## 2018-07-31 DIAGNOSIS — N938 Other specified abnormal uterine and vaginal bleeding: Secondary | ICD-10-CM

## 2018-07-31 DIAGNOSIS — N946 Dysmenorrhea, unspecified: Secondary | ICD-10-CM

## 2018-07-31 MED ORDER — ETONOGESTREL-ETHINYL ESTRADIOL 0.12-0.015 MG/24HR VA RING: VAGINAL_RING | VAGINAL | 12 refills | Status: DC

## 2018-07-31 NOTE — Progress Notes (Signed)
Subjective:     Susan Chandler is a 28 y.o. female here for a routine exam.  Patient's last menstrual period was 07/20/2017 (exact date). [id number] Birth Control Method:  nuva ring Menstrual Calendar(currently): a little irregular  Current complaints: none.   Current acute medical issues:     Recent Gynecologic History Patient's last menstrual period was 07/20/2017 (exact date). Last Pap: 2018,  normal Last mammogram: ,    Past Medical History:  Diagnosis Date  . Abnormal uterine bleeding    Dr. Despina Hidden: OCPs per Dr. Despina Hidden.  . Bipolar disorder [facility])    r/o OCD per psychiatrist 03/2016.  Also, psychologist eval revealed suspected Bipolar II, depressed phase 04/16/2016.  [name] Chronic pain syndrome   . Dysmenorrhea    mgmt per Dr. Despina Hidden (01/2018)  . GAD (generalized anxiety disorder)   . Infectious mononucleosis 2007   ?whooping cough, too??  . Lumbosacral radiculopathy at L5 2015   [facility]; then 2nd opinion with Dr. Shelle Iron at [facility], where she got ESI left L5-S1 11/2013 with moderate improvement.  Saw neurosurgeon 01/2016: surgery NOT recommended.  PT initiated.  . Migraine syndrome   . Obesity, Class I, BMI 30-34.9   . PCOS (polycystic ovarian syndrome)    Her GYN, Dr. Ralph Dowdy, is working her up for this.  . Pre-syncope 2016   ? POTS.  Holter showed mostly NSR, occ PACs, one run of nonsustained atrial tachy.  . Salmonella enteritis 09/2017    Past Surgical History:  Procedure Laterality Date  . WISDOM TOOTH EXTRACTION      OB History    Gravida  0   Para  0   Term  0   Preterm  0   AB  0   Living  0     SAB  0   TAB  0   Ectopic  0   Multiple  0   Live Births  0           Social History   Socioeconomic History  . Marital status: Married    Spouse name: Not on file  . Number of children: Not on file  . Years of education: Not on file  . Highest education level: Not on file  Occupational History  . Not on file   Social Needs  . Financial resource strain: Not on file  . Food insecurity:    Worry: Not on file    Inability: Not on file  . Transportation needs:    Medical: Not on file    Non-medical: Not on file  Tobacco Use  . Smoking status: Never Smoker  . Smokeless tobacco: Never Used  Substance and Sexual Activity  . Alcohol use: No    Comment: 04-05-2016 per pt no  . Drug use: No    Comment: 04-05-2016 per pt no   . Sexual activity: Yes    Birth control/protection: Pill  Lifestyle  . Physical activity:    Days per week: Not on file    Minutes per session: Not on file  . Stress: Not on file  Relationships  . Social connections:    Talks on phone: Not on file    Gets together: Not on file    Attends religious service: Not on file    Active member of club or organization: Not on file    Attends meetings of clubs or organizations: Not on file    Relationship status: Not on file  Other  Topics Concern  . Not on file  Social History Narrative   Married, no children.   College: [employer].   Occupation: Used to work at [employer] in [place]   [place] in [place].   No T/A/Ds.   No exercise.    Family History  Problem Relation Age of Onset  . Cancer Maternal Aunt   . Alcohol abuse Maternal Grandfather   . Bipolar disorder Father   . Bipolar disorder Sister      Current Outpatient Medications:  .  etonogestrel-ethinyl estradiol (NUVARING) 0.12-0.015 MG/24HR vaginal ring, Insert vaginally and leave in place for 3 consecutive weeks, then remove for 1 week., Disp: 1 each, Rfl: 12 .  fluticasone (FLONASE) 50 MCG/ACT nasal spray, Place into both nostrils daily as needed. , Disp: , Rfl:  .  lamoTRIgine (LAMICTAL) 100 MG tablet, TAKE 1 TABLET BY MOUTH TWICE A DAY, Disp: 28 tablet, Rfl: 0 .  rizatriptan (MAXALT) 10 MG tablet, TAKE 1 TABLET (10 MG TOTAL) BY MOUTH AS NEEDED FOR MIGRAINE. MAY REPEAT IN 2 HOURS IF NEEDED, Disp: 10 tablet, Rfl: 1  Review of Systems  Review of  Systems  Constitutional: Negative for fever, chills, weight loss, malaise/fatigue and diaphoresis.  HENT: Negative for hearing loss, ear pain, nosebleeds, congestion, sore throat, neck pain, tinnitus and ear discharge.   Eyes: Negative for blurred vision, double vision, photophobia, pain, discharge and redness.  Respiratory: Negative for cough, hemoptysis, sputum production, shortness of breath, wheezing and stridor.   Cardiovascular: Negative for chest pain, palpitations, orthopnea, claudication, leg swelling and PND.  Gastrointestinal: negative for abdominal pain. Negative for heartburn, nausea, vomiting, diarrhea, constipation, blood in stool and melena.  Genitourinary: Negative for dysuria, urgency, frequency, hematuria and flank pain.  Musculoskeletal: Negative for myalgias, back pain, joint pain and falls.  Skin: Negative for itching and rash.  Neurological: Negative for dizziness, tingling, tremors, sensory change, speech change, focal weakness, seizures, loss of consciousness, weakness and headaches.  Endo/Heme/Allergies: Negative for environmental allergies and polydipsia. Does not bruise/bleed easily.  Psychiatric/Behavioral: Negative for depression, suicidal ideas, hallucinations, memory loss and substance abuse. The patient is not nervous/anxious and does not have insomnia.        Objective:  Blood pressure 110/79, pulse 88, height 5\' 4"  (1.626 m), weight 149 lb (67.6 kg), last menstrual period 07/20/2017.   Physical Exam  Vitals reviewed. Constitutional: She is oriented to person, place, and time. She appears well-developed and well-nourished.  HENT:  Head: Normocephalic and atraumatic.        Right Ear: External ear normal.  Left Ear: External ear normal.  Nose: Nose normal.  Mouth/Throat: Oropharynx is clear and moist.  Eyes: Conjunctivae and EOM are normal. Pupils are equal, round, and reactive to light. Right eye exhibits no discharge. Left eye exhibits no discharge. No  scleral icterus.  Neck: Normal range of motion. Neck supple. No tracheal deviation present. No thyromegaly present.  Cardiovascular: Normal rate, regular rhythm, normal heart sounds and intact distal pulses.  Exam reveals no gallop and no friction rub.   No murmur heard. Respiratory: Effort normal and breath sounds normal. No respiratory distress. She has no wheezes. She has no rales. She exhibits no tenderness.  GI: Soft. Bowel sounds are normal. She exhibits no distension and no mass. There is no tenderness. There is no rebound and no guarding.  Genitourinary:  Breasts no masses skin changes or nipple changes bilaterally      Vulva is normal without lesions Vagina is pink  moist without discharge Cervix normal in appearance and pap is done Uterus is normal size shape and contour Adnexa is negative with normal sized ovaries   Musculoskeletal: Normal range of motion. She exhibits no edema and no tenderness.  Neurological: She is alert and oriented to person, place, and time. She has normal reflexes. She displays normal reflexes. No cranial nerve deficit. She exhibits normal muscle tone. Coordination normal.  Skin: Skin is warm and dry. No rash noted. No erythema. No pallor.  Psychiatric: She has a normal mood and affect. Her behavior is normal. Judgment and thought content normal.       Medications Ordered at today's visit: Meds ordered this encounter  Medications  . etonogestrel-ethinyl estradiol (NUVARING) 0.12-0.015 MG/24HR vaginal ring    Sig: Insert vaginally and leave in place for 3 consecutive weeks, then remove for 1 week.    Dispense:  1 each    Refill:  12    Other orders placed at today's visit: No orders of the defined types were placed in this encounter.     Assessment:    Healthy female exam.    Plan:    Contraception: NuvaRing vaginal inserts. Follow up in: 1 year.     Return in about 1 year (around 08/01/2019) for yearly.

## 2018-08-03 LAB — CYTOLOGY - PAP: Diagnosis: NEGATIVE

## 2018-08-04 ENCOUNTER — Encounter: Payer: Self-pay | Admitting: Family Medicine

## 2018-08-06 MED ORDER — LAMOTRIGINE 100 MG PO TABS: 100.0000 mg | ORAL_TABLET | Freq: Two times a day (BID) | ORAL | 0 refills | Status: DC

## 2018-08-06 NOTE — Telephone Encounter (Signed)
30 day supply of lamictal eRx'd today.

## 2018-08-06 NOTE — Telephone Encounter (Signed)
Please advise. Thanks.  

## 2018-08-10 ENCOUNTER — Encounter: Payer: Self-pay | Admitting: Family Medicine

## 2018-08-20 ENCOUNTER — Encounter: Payer: Self-pay | Admitting: *Deleted

## 2018-08-20 ENCOUNTER — Ambulatory Visit: Payer: 59 | Admitting: Family Medicine

## 2018-08-20 VITALS — BP 102/69 | HR 96 | Temp 98.5°F | Resp 20 | Ht 64.0 in | Wt 149.8 lb

## 2018-08-20 DIAGNOSIS — Z79899 Other long term (current) drug therapy: Secondary | ICD-10-CM | POA: Diagnosis not present

## 2018-08-20 DIAGNOSIS — M47816 Spondylosis without myelopathy or radiculopathy, lumbar region: Secondary | ICD-10-CM

## 2018-08-20 DIAGNOSIS — F3181 Bipolar II disorder: Secondary | ICD-10-CM

## 2018-08-20 DIAGNOSIS — F319 Bipolar disorder, unspecified: Secondary | ICD-10-CM | POA: Insufficient documentation

## 2018-08-20 DIAGNOSIS — G894 Chronic pain syndrome: Secondary | ICD-10-CM | POA: Diagnosis not present

## 2018-08-20 DIAGNOSIS — M5442 Lumbago with sciatica, left side: Secondary | ICD-10-CM

## 2018-08-20 DIAGNOSIS — F411 Generalized anxiety disorder: Secondary | ICD-10-CM

## 2018-08-20 DIAGNOSIS — G8929 Other chronic pain: Secondary | ICD-10-CM

## 2018-08-20 MED ORDER — PROMETHAZINE HCL 12.5 MG PO TABS: ORAL_TABLET | ORAL | 0 refills | Status: DC

## 2018-08-20 MED ORDER — LAMOTRIGINE 100 MG PO TABS: 100.0000 mg | ORAL_TABLET | Freq: Two times a day (BID) | ORAL | 1 refills | Status: DC

## 2018-08-20 NOTE — Progress Notes (Signed)
OFFICE VISIT  08/20/2018   CC:  Chief Complaint  Patient presents with  . Follow-up    RCI, pt is fasting   HPI:    Patient is a 28 y.o. Caucasian female who presents accompanied by her husband for f/u chronic pain syndrome, bipolar II disorder, and GAD.   Indication for chronic opioid: chronic bilat LBP with left sided sciatica.  Opioid pain medication is being rx'd in order to maximize functioning and quality of life. Medication and dose:Vicodin 5/325 and percocet 5/325. # pills per month:90 vic, 30 perc  Last UDS date: 09/29/17. Opioid Treatment Agreement signed (Y/N):  Y, 07/02/17. Opioid Treatment Agreement last reviewed with patient: Today. NCCSRS reviewed this encounter (include red flags): today, no red flags. Most recent rx fill for vicodin, percocet, and lorazepam was 01/19/18.  Pain: she made a decision to stop taking opioids for her pain.  Wanted to see how things would be without them. Go to chiro more often, takes, ibup, ice, heat. Fell at the beginning of the month when left leg briefly "went out" on her, chiropracter helped her get back to good level of functioning.  Normally takes 2 ibup a day.  Pain still radiating down L leg.  No saddle anesthesia or loss of b/b control.   Mood: lamictal 100mg  bid.  Mood is stable overall.  Sometimes the worry and anxiety she feels about her nephew get her sad but it doesn't seem like a pervasive/persistent mood for her.   Anxiety: lorazepam bid helpful in the past but she is trying to not use this med. Prozac caused wt gain.  Wt better since being off this med.  Acute: has had 3 days of diffuse body aches, fatigue, some upset stomach/nausea but no vomiting. PO intake poor.  No diarrhea or constipation.  No ST or cough.  Mild HA.  No known fevers. Husband has respiratory illness.  Past Medical History:  Diagnosis Date  . Abnormal uterine bleeding    Dr. Despina Hidden: OCPs per Dr. Despina Hidden.  . Bipolar disorder [facility])    r/o OCD per  psychiatrist 03/2016.  Also, psychologist eval revealed suspected Bipolar II, depressed phase 04/16/2016.  [name] Chronic pain syndrome   . Dysmenorrhea    mgmt per Dr. Despina Hidden (01/2018).  Nuva Ring vag inserts 07/2018.  [name] GAD (generalized anxiety disorder)   . Infectious mononucleosis 2007   ?whooping cough, too??  . Lumbosacral radiculopathy at L5 2015   [facility]; then 2nd opinion with Dr. Shelle Iron at [facility], where she got ESI left L5-S1 11/2013 with moderate improvement.  Saw neurosurgeon 01/2016: surgery NOT recommended.  PT initiated.  . Migraine syndrome   . Obesity, Class I, BMI 30-34.9   . PCOS (polycystic ovarian syndrome)    Her GYN, Dr. Ralph Dowdy, is working her up for this.  . Pre-syncope 2016   ? POTS.  Holter showed mostly NSR, occ PACs, one run of nonsustained atrial tachy.  . Salmonella enteritis 09/2017    Past Surgical History:  Procedure Laterality Date  . WISDOM TOOTH EXTRACTION      Outpatient Medications Prior to Visit  Medication Sig Dispense Refill  . etonogestrel-ethinyl estradiol (NUVARING) 0.12-0.015 MG/24HR vaginal ring Insert vaginally and leave in place for 3 consecutive weeks, then remove for 1 week. 1 each 12  . fluticasone (FLONASE) 50 MCG/ACT nasal spray Place into both nostrils daily as needed.     . rizatriptan (MAXALT) 10 MG tablet TAKE 1 TABLET (10  MG TOTAL) BY MOUTH AS NEEDED FOR MIGRAINE. MAY REPEAT IN 2 HOURS IF NEEDED 10 tablet 1  . lamoTRIgine (LAMICTAL) 100 MG tablet Take 1 tablet (100 mg total) by mouth 2 (two) times daily. 60 tablet 0   No facility-administered medications prior to visit.     Allergies  Allergen Reactions  . Fish-Derived Products     Shell fish make her sick    ROS As per HPI  PE: Blood pressure 102/69, pulse 96, temperature 98.5 F (36.9 C), temperature source Oral, resp. rate 20, height 5\' 4"  (1.626 m), weight 149 lb 12.8 oz (67.9 kg), SpO2 98 %. Gen: Alert, well appearing.  Patient is  oriented to person, place, time, and situation. AFFECT: pleasant, lucid thought and speech. ENT: Ears: EACs clear, normal epithelium.  TMs with good light reflex and landmarks bilaterally.  Eyes: no injection, icteris, swelling, or exudate.  EOMI, PERRLA. Nose: no drainage or turbinate edema/swelling.  No injection or focal lesion.  Mouth: lips without lesion/swelling.  Oral mucosa pink and moist.  Dentition intact and without obvious caries or gingival swelling.  Oropharynx without erythema, exudate, or swelling.  Neck - No masses or thyromegaly or limitation in range of motion CV: RRR, no m/r/g.   LUNGS: CTA bilat, nonlabored resps, good aeration in all lung fields. ABD: soft, NT, ND, BS normal.  No hepatospenomegaly or mass.  No bruits. EXT: no clubbing or cyanosis.  No edema.  Back: mild TTP diffusely in L spine paraspinous soft tissues.  Sitting SLR neg for radiculopathy sx's bilat. LE strength and sensation intact bilat.  Trace DTRs in patellar and achilles bilat.   LABS:    Chemistry      Component Value Date/Time   NA 141 04/22/2018 1351   K 5.0 04/22/2018 1351   CL 106 04/22/2018 1351   CO2 27 04/22/2018 1351   BUN 8 04/22/2018 1351   CREATININE 0.77 04/22/2018 1351      Component Value Date/Time   CALCIUM 9.7 04/22/2018 1351   ALKPHOS 30 (L) 10/03/2017 1352   AST 13 10/03/2017 1352   ALT 9 10/03/2017 1352   BILITOT 0.3 10/03/2017 1352     Lab Results  Component Value Date   WBC 5.4 04/22/2018   HGB 14.8 04/22/2018   HCT 43.5 04/22/2018   MCV 91.4 04/22/2018   PLT 318.0 04/22/2018   Lab Results  Component Value Date   TSH 2.14 10/20/2014   IMPRESSION AND PLAN:  1) Chronic pain syndrome/chronic low back pain with radiculopathy: she is doing fairly well since self d/c'ing her opioid pain meds.  She takes NSAIDs in appropriate amounts.  Seeing chiropractor regularly. She is open to giving PT another try so I referred [place] to [place] [address] today.  2) Bipolar II,  with GAD: continue lamictal 100 mg bid.  She will consider a trial of an antidepressant to replace the fluoxetine (wt gain) and will call me if she wants to start one.  She has some ativan to take prn.  No new rx was given for this med today.  3) Viral syndrome.  Main bothersome symptom is nausea.  I rx'd phenergan 12.5mg , 1-2 q6h prn. Discussed hydration, rest.  An After Visit Summary was printed and given to the patient.  FOLLOW UP: Return in about 3 months (around 11/18/2018) for routine chronic illness f/u.  Signed:  Santiago Bumpers, MD           08/20/2018

## 2018-09-09 ENCOUNTER — Encounter: Payer: Self-pay | Admitting: Family Medicine

## 2018-09-16 ENCOUNTER — Ambulatory Visit: Payer: 59 | Admitting: Physical Therapy

## 2018-09-21 ENCOUNTER — Ambulatory Visit (INDEPENDENT_AMBULATORY_CARE_PROVIDER_SITE_OTHER): Payer: 59 | Admitting: Family Medicine

## 2018-09-21 ENCOUNTER — Encounter: Payer: Self-pay | Admitting: Family Medicine

## 2018-09-21 ENCOUNTER — Other Ambulatory Visit: Payer: Self-pay

## 2018-09-21 DIAGNOSIS — K8050 Calculus of bile duct without cholangitis or cholecystitis without obstruction: Secondary | ICD-10-CM

## 2018-09-21 MED ORDER — PROMETHAZINE HCL 12.5 MG PO TABS: ORAL_TABLET | ORAL | 2 refills | Status: DC

## 2018-09-21 NOTE — Progress Notes (Signed)
Virtual Visit via Video Note  I connected with@ on 09/21/18 at 11:15 AM EDT by a video enabled telemedicine application and verified that I am speaking with the correct person using two identifiers.  Location patient: home Location provider:work or home office Persons participating in the virtual visit: patient, provider  I discussed the limitations of evaluation and management by telemedicine and the availability of in person appointments. The patient expressed understanding and agreed to proceed.   HPI: About 1 week ago felt pain under R ribs after eating, lasted about 1 hr. Gets stomach pains after she eats --consistently starts about 20 min to really "kick in".  Bad nausea with it, no vomiting.  Gassy a lot.  She is able to fully eat her meals.  No NSAID use. No recent new meds or changes in meds. Some loose BM's on one day then it resolved.  No fevers.  Pain extends towards middle/upper abd range some but not into lower abdomen or LUQ. No one in home with similar sx's.  No meds taken for the sx's.   ROS: no CP, no SOB, no wheezing, no cough, no dizziness, no HAs, no rashes, no melena/hematochezia.  No polyuria or polydipsia.  No myalgias or arthralgias.   Past Medical History:  Diagnosis Date  . Abnormal uterine bleeding    Dr. Despina Hidden: OCPs per Dr. Despina Hidden.  . Bipolar disorder [facility])    r/o OCD per psychiatrist 03/2016.  Also, psychologist eval revealed suspected Bipolar II, depressed phase 04/16/2016.  [name] Chronic pain syndrome   . Dysmenorrhea    mgmt per Dr. Despina Hidden (01/2018).  Nuva Ring vag inserts 07/2018.  [name] GAD (generalized anxiety disorder)   . Infectious mononucleosis 2007   ?whooping cough, too??  . Lumbosacral radiculopathy at L5 2015   [facility]; then 2nd opinion with Dr. Shelle Iron at [facility] ortho, where she got ESI left L5-S1 11/2013 with moderate improvement.  Saw neurosurgeon 01/2016: surgery NOT recommended.  PT initiated.  . Migraine syndrome    . Obesity, Class I, BMI 30-34.9   . PCOS (polycystic ovarian syndrome)    Her GYN, Dr. Ralph Dowdy, is working her up for this.  . Pre-syncope 2016   ? POTS.  Holter showed mostly NSR, occ PACs, one run of nonsustained atrial tachy.  . Salmonella enteritis 09/2017    Past Surgical History:  Procedure Laterality Date  . WISDOM TOOTH EXTRACTION      Family History  Problem Relation Age of Onset  . Cancer Maternal Aunt   . Alcohol abuse Maternal Grandfather   . Bipolar disorder Father   . Bipolar disorder Sister      Current Outpatient Medications:  .  etonogestrel-ethinyl estradiol (NUVARING) 0.12-0.015 MG/24HR vaginal ring, Insert vaginally and leave in place for 3 consecutive weeks, then remove for 1 week., Disp: 1 each, Rfl: 12 .  fluticasone (FLONASE) 50 MCG/ACT nasal spray, Place into both nostrils daily as needed. , Disp: , Rfl:  .  lamoTRIgine (LAMICTAL) 100 MG tablet, Take 1 tablet (100 mg total) by mouth 2 (two) times daily., Disp: 180 tablet, Rfl: 1 .  rizatriptan (MAXALT) 10 MG tablet, TAKE 1 TABLET (10 MG TOTAL) BY MOUTH AS NEEDED FOR MIGRAINE. MAY REPEAT IN 2 HOURS IF NEEDED, Disp: 10 tablet, Rfl: 1 .  promethazine (PHENERGAN) 12.5 MG tablet, 1-2 q6h prn nausea (Patient not taking: Reported on 09/21/2018), Disp: 30 tablet, Rfl: 2  EXAM:  VITALS : none  GENERAL: alert, oriented, appears  well and in no acute distress AFFECT: pleasant, lucid thought and speech.   HEENT: no icterus or pallor.  NECK: normal movements of the head and neck    ASSESSMENT AND PLAN:  Discussed the following assessment and plan:  1) Biliary colic: low suspicion of active/acute cholecystitis. This makes getting an ultrasound more difficult at this time due to the COVID-19 restrictions that radiology has in place. Will check CBC w/diff and CMET to make sure no sign of early inflammation occurring that would make it more appropriate to get imaging earlier rather than later. In the meantime  will have her eat low fat diet and treat with promethazine 12.5mg , 1-2 q6h prn, #30, RF x 2. Signs/symptoms to call or return for were reviewed and pt expressed understanding.   I discussed the assessment and treatment plan with the patient. The patient was provided an opportunity to ask questions and all were answered. The patient agreed with the plan and demonstrated an understanding of the instructions.   The patient was advised to call back or seek an in-person evaluation if the symptoms worsen or if the condition fails to improve as anticipated.  An After Visit Summary was printed and given to the patient.  Follow up: to be determined based on results of w/u and course of sx's over the next 1-2 days.  Signed:  Santiago Bumpers, MD           09/21/2018

## 2018-09-23 ENCOUNTER — Other Ambulatory Visit (INDEPENDENT_AMBULATORY_CARE_PROVIDER_SITE_OTHER): Payer: 59

## 2018-09-23 DIAGNOSIS — K8050 Calculus of bile duct without cholangitis or cholecystitis without obstruction: Secondary | ICD-10-CM

## 2018-09-23 LAB — CBC WITH DIFFERENTIAL/PLATELET
Basophils Absolute: 0 10*3/uL (ref 0.0–0.1)
Basophils Relative: 0.6 % (ref 0.0–3.0)
Eosinophils Absolute: 0 10*3/uL (ref 0.0–0.7)
Eosinophils Relative: 0.9 % (ref 0.0–5.0)
HCT: 40.5 % (ref 36.0–46.0)
Hemoglobin: 13.8 g/dL (ref 12.0–15.0)
Lymphocytes Relative: 30 % (ref 12.0–46.0)
Lymphs Abs: 1.6 10*3/uL (ref 0.7–4.0)
MCHC: 34.1 g/dL (ref 30.0–36.0)
MCV: 92.1 fl (ref 78.0–100.0)
Monocytes Absolute: 0.2 10*3/uL (ref 0.1–1.0)
Monocytes Relative: 4.3 % (ref 3.0–12.0)
Neutro Abs: 3.5 10*3/uL (ref 1.4–7.7)
Neutrophils Relative %: 64.2 % (ref 43.0–77.0)
Platelets: 306 10*3/uL (ref 150.0–400.0)
RBC: 4.39 Mil/uL (ref 3.87–5.11)
RDW: 12.7 % (ref 11.5–15.5)
WBC: 5.4 10*3/uL (ref 4.0–10.5)

## 2018-09-23 LAB — COMPREHENSIVE METABOLIC PANEL
ALT: 8 U/L (ref 0–35)
AST: 11 U/L (ref 0–37)
Albumin: 4.3 g/dL (ref 3.5–5.2)
Alkaline Phosphatase: 34 U/L — ABNORMAL LOW (ref 39–117)
BUN: 11 mg/dL (ref 6–23)
CO2: 26 mEq/L (ref 19–32)
Calcium: 9.3 mg/dL (ref 8.4–10.5)
Chloride: 107 mEq/L (ref 96–112)
Creatinine, Ser: 0.79 mg/dL (ref 0.40–1.20)
GFR: 86.21 mL/min (ref >60.00)
Glucose, Bld: 96 mg/dL (ref 70–99)
Potassium: 4.3 mEq/L (ref 3.5–5.1)
Sodium: 141 mEq/L (ref 135–145)
Total Bilirubin: 0.4 mg/dL (ref 0.2–1.2)
Total Protein: 6.7 g/dL (ref 6.0–8.3)

## 2018-09-24 ENCOUNTER — Encounter: Payer: Self-pay | Admitting: Family Medicine

## 2018-10-09 ENCOUNTER — Telehealth: Payer: Self-pay

## 2018-10-09 MED ORDER — LORAZEPAM 0.5 MG PO TABS: 0.5000 mg | ORAL_TABLET | Freq: Three times a day (TID) | ORAL | 1 refills | Status: DC | PRN

## 2018-10-09 NOTE — Telephone Encounter (Signed)
SW pt to confirm refill needed of Lorazepam. Received refill request from pt's pharmacy.   RF request for Lorazepam LOV: 08/20/18 Next ov: none Last written: 01/19/18 #60 w/ 1 RF.  PMP aware printed. Please advise if appropriate for refill.

## 2018-10-09 NOTE — Telephone Encounter (Signed)
OK--Lorazepam 0.5mg , 1 tid prn, #60, RF x 1 eRx'd today. Will need to make sure CSC/UDS are UTD at next f/u appt.

## 2018-10-12 ENCOUNTER — Emergency Department (HOSPITAL_COMMUNITY)
Admission: EM | Admit: 2018-10-12 | Discharge: 2018-10-13 | Disposition: A | Discharge status: Home / Self Care | Payer: 59 | Attending: Emergency Medicine | Admitting: Emergency Medicine

## 2018-10-12 ENCOUNTER — Other Ambulatory Visit: Payer: Self-pay

## 2018-10-12 ENCOUNTER — Emergency Department (HOSPITAL_COMMUNITY): Payer: 59

## 2018-10-12 ENCOUNTER — Encounter (HOSPITAL_COMMUNITY): Payer: Self-pay

## 2018-10-12 ENCOUNTER — Telehealth: Payer: Self-pay | Admitting: Family Medicine

## 2018-10-12 DIAGNOSIS — Z79899 Other long term (current) drug therapy: Secondary | ICD-10-CM | POA: Insufficient documentation

## 2018-10-12 DIAGNOSIS — R109 Unspecified abdominal pain: Secondary | ICD-10-CM | POA: Diagnosis present

## 2018-10-12 DIAGNOSIS — I880 Nonspecific mesenteric lymphadenitis: Secondary | ICD-10-CM | POA: Insufficient documentation

## 2018-10-12 LAB — URINALYSIS, ROUTINE W REFLEX MICROSCOPIC
Bacteria, UA: NONE SEEN
Bilirubin Urine: NEGATIVE
Glucose, UA: NEGATIVE mg/dL
Ketones, ur: NEGATIVE mg/dL
Nitrite: NEGATIVE
Protein, ur: NEGATIVE mg/dL
Specific Gravity, Urine: 1.03 (ref 1.005–1.030)
pH: 5 (ref 5.0–8.0)

## 2018-10-12 LAB — COMPREHENSIVE METABOLIC PANEL
ALT: 16 U/L (ref 0–44)
AST: 18 U/L (ref 15–41)
Albumin: 4.3 g/dL (ref 3.5–5.0)
Alkaline Phosphatase: 44 U/L (ref 38–126)
Anion gap: 11 (ref 5–15)
BUN: 9 mg/dL (ref 6–20)
CO2: 24 mmol/L (ref 22–32)
Calcium: 9 mg/dL (ref 8.9–10.3)
Chloride: 105 mmol/L (ref 98–111)
Creatinine, Ser: 0.77 mg/dL (ref 0.44–1.00)
GFR calc Af Amer: >60 mL/min (ref >60)
GFR calc non Af Amer: >60 mL/min (ref >60)
Glucose, Bld: 105 mg/dL — ABNORMAL HIGH (ref 70–99)
Potassium: 3.9 mmol/L (ref 3.5–5.1)
Sodium: 140 mmol/L (ref 135–145)
Total Bilirubin: 0.2 mg/dL — ABNORMAL LOW (ref 0.3–1.2)
Total Protein: 7.9 g/dL (ref 6.5–8.1)

## 2018-10-12 LAB — CBC
HCT: 43.2 % (ref 36.0–46.0)
Hemoglobin: 14.6 g/dL (ref 12.0–15.0)
MCH: 31.3 pg (ref 26.0–34.0)
MCHC: 33.8 g/dL (ref 30.0–36.0)
MCV: 92.7 fL (ref 80.0–100.0)
Platelets: 248 10*3/uL (ref 150–400)
RBC: 4.66 MIL/uL (ref 3.87–5.11)
RDW: 12.1 % (ref 11.5–15.5)
WBC: 10.6 10*3/uL — ABNORMAL HIGH (ref 4.0–10.5)
nRBC: 0 % (ref 0.0–0.2)

## 2018-10-12 LAB — LIPASE, BLOOD: Lipase: 27 U/L (ref 11–51)

## 2018-10-12 LAB — POC URINE PREG, ED: Preg Test, Ur: NEGATIVE

## 2018-10-12 MED ORDER — IOHEXOL 300 MG/ML  SOLN
100.0000 mL | Freq: Once | INTRAMUSCULAR | Status: AC | PRN
  Administered 2018-10-12: 100 mL via INTRAVENOUS

## 2018-10-12 MED ORDER — ONDANSETRON HCL 4 MG/2ML IJ SOLN
4.0000 mg | Freq: Once | INTRAMUSCULAR | Status: AC
  Administered 2018-10-12: 4 mg via INTRAVENOUS
  Filled 2018-10-12: qty 2

## 2018-10-12 MED ORDER — HYDROMORPHONE HCL 1 MG/ML IJ SOLN
0.5000 mg | Freq: Once | INTRAMUSCULAR | Status: AC
  Administered 2018-10-12: 0.5 mg via INTRAVENOUS
  Filled 2018-10-12: qty 1

## 2018-10-12 MED ORDER — SODIUM CHLORIDE 0.9 % IV BOLUS (SEPSIS)
1000.0000 mL | Freq: Once | INTRAVENOUS | Status: AC
  Administered 2018-10-12: 1000 mL via INTRAVENOUS

## 2018-10-12 MED ORDER — SODIUM CHLORIDE 0.9 % IV SOLN: 1000.0000 mL | INTRAVENOUS | Status: DC

## 2018-10-12 NOTE — ED Triage Notes (Signed)
Pt c/o of epigastric pain that goes around the right side of her abdomen. Had had recurrent issues 4-6 weeks. Pcp was concerned about gall bladder . Unable to eat, pain  Is worse shortly after eating. Also experiences nausea with eating

## 2018-10-12 NOTE — Telephone Encounter (Signed)
I'll order abdominal ultrasound. Where does she want to get the imaging [facility]  [facility] [place]  [facility] [place]  Let me know.-thx

## 2018-10-12 NOTE — Telephone Encounter (Signed)
Pt was made aware that med was sent. She was also notified of when her next follow up so we can get an updated CSC and UDS.

## 2018-10-12 NOTE — ED Provider Notes (Signed)
[facility] EMERGENCY DEPARTMENT Provider Note   CSN: [id number] Arrival date & time: 10/12/18  2039    History   Chief Complaint Chief Complaint  Patient presents with  . [facility]    HPI Susan Chandler is a 28 y.o. female.     Patient is a 28 year old female who presents to the emergency department with a complaint of abdominal pain.  The patient states this problem started about 6 weeks ago.  The patient states that she is been having mostly epigastric area pain.  The pain then started moving from the epigastric area and moving around to the right side of her abdomen and flank area.  The pain is aggravated seemingly more when she eats.  She says she does not eat greasy foods in general, but now she feels uncomfortable when she eats almost anything other than baked potato and baked salmon.  The patient has been evaluated by her primary physician for this.  It was the opinion at that time that this was possibly related to gallbladder issues.  The patient states she has been trying to stand the discomfort and the nausea, but this seems to be getting worse.  She recently had a video conference with her physician.  She went into the office to have blood work done.  She said she was told that the blood work was okay.  The pain got even worse in the last 2days to a point where the patient is mostly unable to eat without nausea.  She has been having more gas than usual, and she says at times it has a foul odor to it.  Patient states she is not had any high fever.  She is not been vomiting.  She presents now for assistance with this issue.  The history is provided by the patient.  Abdominal Pain  Associated symptoms: nausea   Associated symptoms: no chest pain, no cough, no dysuria, no hematuria, no shortness of breath and no vomiting     Past Medical History:  Diagnosis Date  . Abnormal uterine bleeding    Dr. Despina HiddenEure: OCPs per Dr. Despina HiddenEure.  . Bipolar disorder [facility](HCC)    r/o OCD per  psychiatrist 03/2016.  Also, psychologist eval revealed suspected Bipolar II, depressed phase 04/16/2016.  [name]. Chronic pain syndrome   . Dysmenorrhea    mgmt per Dr. Despina HiddenEure (01/2018).  Nuva Ring vag inserts 07/2018.  [name]. GAD (generalized anxiety disorder)   . Infectious mononucleosis 2007   ?whooping cough, too??  . Lumbosacral radiculopathy at L5 2015   [facility]ull[facility]; then [facility], where she got ESI left L5-S1 11/2013 with moderate improvement.  Saw neurosurgeon 01/2016: surgery NOT recommended.  PT initiated.  . Migraine syndrome   . Obesity, Class I, BMI 30-34.9   . PCOS (polycystic ovarian syndrome)    Her GYN, Dr. Ralph DowdyBuist, is working her up for this.  . Pre-syncope 2016   ? POTS.  Holter showed mostly NSR, occ PACs, one run of nonsustained atrial tachy.  . Salmonella enteritis 09/2017    Patient Active Problem List   Diagnosis Date Noted  . Bipolar disorder (HCC)   . Moderate single current episode of major depressive disorder (HCC) 04/05/2016  . Social anxiety disorder 01/08/2016  . Maxillary sinusitis, acute 09/12/2015  . Generalized anxiety disorder 02/04/2014  . Insomnia 02/04/2014  . Left lumbar radiculopathy 10/18/2013    Past Surgical History:  Procedure Laterality Date  .  WISDOM TOOTH EXTRACTION       OB History    Gravida  0   Para  0   Term  0   Preterm  0   AB  0   Living  0     SAB  0   TAB  0   Ectopic  0   Multiple  0   Live Births  0            Home Medications    Prior to Admission medications   Medication Sig Start Date End Date Taking? Authorizing Provider  acetaminophen (TYLENOL) 500 MG tablet Take 500 mg by mouth every 6 (six) hours as needed for mild pain or moderate pain.   Yes [provider]  etonogestrel-ethinyl estradiol (NUVARING) 0.12-0.015 MG/24HR vaginal ring Insert vaginally and leave in place for 3 consecutive weeks, then remove for 1 week. Patient  taking differently: Place 1 each vaginally See admin instructions. Insert vaginally and leave in place for 3 consecutive weeks, then remove for 1 week. 07/31/18  Yes Lazaro Arms, MD  fluticasone (FLONASE) 50 MCG/ACT nasal spray Place into both nostrils daily as needed for allergies or rhinitis.    Yes [provider]  ibuprofen (ADVIL) 200 MG tablet Take 400 mg by mouth every 6 (six) hours as needed for moderate pain.   Yes [provider]  lamoTRIgine (LAMICTAL) 100 MG tablet Take 1 tablet (100 mg total) by mouth 2 (two) times daily. 08/20/18  Yes McGowen, Maryjean Morn, MD  LORazepam (ATIVAN) 0.5 MG tablet Take 1 tablet (0.5 mg total) by mouth every 8 (eight) hours as needed for anxiety. Per psychiatrist 10/09/18  Yes McGowen, Maryjean Morn, MD  rizatriptan (MAXALT) 10 MG tablet TAKE 1 TABLET (10 MG TOTAL) BY MOUTH AS NEEDED FOR MIGRAINE. MAY REPEAT IN 2 HOURS IF NEEDED 04/29/18  Yes McGowen, Maryjean Morn, MD  promethazine (PHENERGAN) 12.5 MG tablet 1-2 q6h prn nausea Patient not taking: Reported on 09/21/2018 09/21/18   McGowen, Maryjean Morn, MD    Family History Family History  Problem Relation Age of Onset  . Cancer Maternal Aunt   . Alcohol abuse Maternal Grandfather   . Bipolar disorder Father   . Bipolar disorder Sister     Social History Social History   Tobacco Use  . Smoking status: Never Smoker  . Smokeless tobacco: Never Used  Substance Use Topics  . Alcohol use: No    Comment: 04-05-2016 per pt no  . Drug use: No    Comment: 04-05-2016 per pt no      Allergies   Fish-derived products and Shellfish allergy   Review of Systems Review of Systems  Constitutional: Negative for activity change.       All ROS Neg except as noted in HPI  HENT: Negative.   Eyes: Negative for photophobia and discharge.  Respiratory: Negative for cough, shortness of breath and wheezing.   Cardiovascular: Negative for chest pain and palpitations.  Gastrointestinal: Positive for abdominal  pain and nausea. Negative for blood in stool and vomiting.  Genitourinary: Negative for dysuria, frequency and hematuria.  Musculoskeletal: Negative for arthralgias, back pain and neck pain.  Skin: Negative.   Neurological: Negative for dizziness, seizures and speech difficulty.  Psychiatric/Behavioral: Negative for confusion and hallucinations.     Physical Exam Updated Vital Signs BP 124/79   Pulse (!) 119   Temp 98.3 F (36.8 C) (Oral)   Resp 18   LMP 10/12/2018 (LMP Unknown)  SpO2 100%   Physical Exam Vitals signs and nursing note reviewed.  Constitutional:      Appearance: She is well-developed. She is not toxic-appearing.  HENT:     Head: Normocephalic.     Right Ear: Tympanic membrane and external ear normal.     Left Ear: Tympanic membrane and external ear normal.  Eyes:     General: Lids are normal.     Pupils: Pupils are equal, round, and reactive to light.  Neck:     Musculoskeletal: Normal range of motion and neck supple.     Vascular: No carotid bruit.  Cardiovascular:     Rate and Rhythm: Regular rhythm. Tachycardia present.     Pulses: Normal pulses.     Heart sounds: Normal heart sounds.  Pulmonary:     Effort: No respiratory distress.     Breath sounds: Normal breath sounds.  Abdominal:     General: Bowel sounds are normal.     Palpations: Abdomen is soft.     Tenderness: There is abdominal tenderness in the epigastric area, periumbilical area and left upper quadrant. There is right CVA tenderness. There is no guarding.  Musculoskeletal: Normal range of motion.  Lymphadenopathy:     Head:     Right side of head: No submandibular adenopathy.     Left side of head: No submandibular adenopathy.     Cervical: No cervical adenopathy.  Skin:    General: Skin is warm and dry.  Neurological:     Mental Status: She is alert and oriented to person, place, and time.     Cranial Nerves: No cranial nerve deficit.     Sensory: No sensory deficit.   Psychiatric:        Speech: Speech normal.      ED Treatments / Results  Labs (all labs ordered are listed, but only abnormal results are displayed) Labs Reviewed  LIPASE, BLOOD  COMPREHENSIVE METABOLIC PANEL  CBC  URINALYSIS, ROUTINE W REFLEX MICROSCOPIC  POC URINE PREG, ED    EKG None  Radiology No results found.  Procedures Procedures (including critical care time)  Medications Ordered in ED Medications - No data to display   Initial Impression / Assessment and Plan / ED Course  I have reviewed the triage vital signs and the nursing notes.  Pertinent labs & imaging results that were available during my care of the patient were reviewed by me and considered in my medical decision making (see chart for details).          Final Clinical Impressions(s) / ED Diagnoses MDM  Patient reports her pain is primarily under the right lower ribs.  During examination her pain was mostly in the epigastric area the periumbilical area, and the left upper quadrant area.  Heart rate is elevated, otherwise vital signs within normal limits.  Pulse oximetry is 100% on room air.  Within normal limits by my interpretation. Medication given for pain and nausea.  Urine pregnancy is negative.  Doubt that this pain is related to an ectopic or other complications of pregnancy.  Urine analysis shows a hazy yellow specimen with a specific gravity 1.030.  There is a small amount of blood and a small leukocyte esterase present.  There 21 white blood cells per high-powered field.  We will send the urine for culture.  Comprehensive metabolic panel is nonacute.  Anion gap is normal at 11. Complete blood count shows a slight elevation in the white blood cells of 10,600, there is  no shift to the left.  Test is otherwise negative.  Given the multiple areas of abdominal pain, the patient will receive a CT scanning of the abdomen with contrast.  CT scan shows clusters of right lower quadrant lymph  nodes which may indicate mesenteric adenitis.  There was nonvisualization of the appendix, but no right lower quadrant inflammatory findings.  Patient states she received some improvement in her pain from medication.  She received good improvement in the nausea.  Patient is ambulatory in the room with minimal problem.  I have requested the patient to have an outpatient right upper quadrant ultrasound of the abdomen to evaluate the gallbladder and biliary tree.  Prescription for medication for nausea and pain given to the patient.  Prescription for Pepcid and Protonix also given to the patient to use.  Culture the urine is pending.  Patient is to have GI, and or surgical follow-up pending the gallbladder study.   Final diagnoses:  Mesenteric adenitis    ED Discharge Orders         Ordered    ondansetron (ZOFRAN) 4 MG tablet  Every 6 hours     10/13/18 0018    traMADol (ULTRAM) 50 MG tablet  Every 6 hours PRN     10/13/18 0018    famotidine (PEPCID) 20 MG tablet  2 times daily     10/13/18 0018    pantoprazole (PROTONIX) 20 MG tablet  Daily     10/13/18 0018    US Abdomen Limited RUQ/Gall Gladder     10/13/18 0020           Ivery Quale, PA-C 10/13/18 0156    Long, Arlyss Repress, MD 10/13/18 [phone]

## 2018-10-12 NOTE — Telephone Encounter (Signed)
Copied from CRM [id number]. Topic: Quick Communication - See Telephone Encounter >> Oct 12, 2018  1:51 PM Trula Slade wrote: CRM for notification. See Telephone encounter for: 10/12/18. Patient had a virtual visit with Dr. Milinda Cave a couple of weeks ago concerning a possible gall bladder issue.  She was told if it got worse to call.  She stated she is still experiencing the same amount of pain.  Nothing has changed.  Please advise.

## 2018-10-12 NOTE — ED Notes (Signed)
Patient transported to CT 

## 2018-10-13 ENCOUNTER — Telehealth: Payer: Self-pay

## 2018-10-13 ENCOUNTER — Ambulatory Visit (HOSPITAL_COMMUNITY)
Admission: RE | Admit: 2018-10-13 | Discharge: 2018-10-13 | Disposition: A | Discharge status: Home / Self Care | Payer: 59 | Source: Ambulatory Visit | Attending: Physician Assistant | Admitting: Physician Assistant

## 2018-10-13 ENCOUNTER — Encounter: Payer: Self-pay | Admitting: Family Medicine

## 2018-10-13 MED ORDER — ONDANSETRON HCL 4 MG PO TABS: 4.0000 mg | ORAL_TABLET | Freq: Four times a day (QID) | ORAL | 0 refills | Status: DC

## 2018-10-13 MED ORDER — PANTOPRAZOLE SODIUM 20 MG PO TBEC: 20.0000 mg | DELAYED_RELEASE_TABLET | Freq: Every day | ORAL | 1 refills | Status: DC

## 2018-10-13 MED ORDER — HYDROCODONE-ACETAMINOPHEN 5-325 MG PO TABS
1.0000 | ORAL_TABLET | Freq: Once | ORAL | Status: AC
  Administered 2018-10-13: 01:00:00 1 via ORAL
  Filled 2018-10-13: qty 1

## 2018-10-13 MED ORDER — PROMETHAZINE HCL 12.5 MG PO TABS
12.5000 mg | ORAL_TABLET | Freq: Once | ORAL | Status: AC
  Administered 2018-10-13: 12.5 mg via ORAL
  Filled 2018-10-13: qty 1

## 2018-10-13 MED ORDER — FAMOTIDINE 20 MG PO TABS: 20.0000 mg | ORAL_TABLET | Freq: Two times a day (BID) | ORAL | 0 refills | Status: DC

## 2018-10-13 MED ORDER — TRAMADOL HCL 50 MG PO TABS: 50.0000 mg | ORAL_TABLET | Freq: Four times a day (QID) | ORAL | 0 refills | Status: DC | PRN

## 2018-10-13 NOTE — Telephone Encounter (Signed)
I am looking at the imaging orders and it looks like the provider who saw her in the ED already ordered the ultrasound. She should call [facility] radiology department and ask to schedule the ultrasound that is already ordered. Tell her to call us back if she is having trouble getting this arranged.-thx

## 2018-10-13 NOTE — Telephone Encounter (Signed)
FYI: Pt went to ED yesterday.

## 2018-10-13 NOTE — Discharge Instructions (Addendum)
Your CT scan suggest a condition called mesenteric adenitis.  This is usually caused by a viral illness.  Please use clear liquid diet for now.  Use Pepcid 2 times daily, Protonix daily, Zofran every 6 hours as needed for nausea, and Ultram every 6 hours for pain not improved by Tylenol or ibuprofen.  Please wash hands frequently.  Use your mask until symptoms have resolved.  An order request has been placed for an outpatient ultrasound to evaluate your gallbladder and biliary tree.  Please call the number on your discharge instructions later this morning for a time to be scheduled for your test.  Please do not eat or drink anything after 5 AM.

## 2018-10-13 NOTE — Telephone Encounter (Signed)
My Chart message from pt:  I just had my ultrasound done at [facility]. The doctor said my gallbladder looked fine but I have Mesenteric Adenitis. Do I need an antibiotic to help clear this up? It's been going on for some time now.  Please advise

## 2018-10-13 NOTE — ED Provider Notes (Signed)
Patient seen in follow-up to a outpatient ultrasound ordered from prior ED visit.  Ultrasound was unremarkable for any acute findings.  I reviewed this with the patient and asked her to follow-up with her primary care doctor for further evaluation.  She understands to return if any worsening symptoms.   Terrilee Files, MD 10/13/18 1228

## 2018-10-13 NOTE — Telephone Encounter (Signed)
Unfortunately, mesenteric adenitis is not anything that can be treated with things like antibiotics.  It is usually from a viral infection of some kind (not COVID) that can take a while to completely resolve. Treatment is symptomatic care: nausea med if needed, very bland diet, plenty of clear fluids (shoot for 65 ounces per day), rest.  This will run it's course b/c your body's immune system will take care of things.

## 2018-10-13 NOTE — Telephone Encounter (Signed)
LMTCB to be further advised.

## 2018-10-13 NOTE — Telephone Encounter (Signed)
SW pt this morning and she is still needing the [place] to be ordered. She would like to go to [facility]

## 2018-10-13 NOTE — Telephone Encounter (Signed)
Pt was advised to call [facility] and CB if any problems scheduling.

## 2018-10-13 NOTE — Telephone Encounter (Signed)
LMTCB to let us know location she would prefer for [place].

## 2018-10-14 LAB — URINE CULTURE: Special Requests: NORMAL

## 2018-10-14 NOTE — Telephone Encounter (Signed)
MyChart message sent and read. Patient was advised on PCP recommendations

## 2018-10-15 NOTE — Progress Notes (Signed)
OFFICE VISIT  10/16/2018   CC:  Chief Complaint  Patient presents with  . discuss [place] and last VV 09/21/18   HPI:    Patient is a 28 y.o. Caucasian female who presents for further evaluation of ongoing abdominal pain and nausea. I saw her initially for this problem 09/21/18, at which time she had been having sx's for about 1 week.  She described what I felt to be biliary colic. I checked CBC and CMET and these were normal, so we chose to hold off on any imaging, avoid high fat meals.  If sx's persisted then we planned on getting u/s to eval for stones.  Interim hx:  Symptoms have persisted and gradually worsened: she describes loss of appetite and a twisting/cramping kind of abd pain in various areas, RUQ seems to be the most significant and persistent---can be any quadrant, lots of nausea, very tired.  She has not been actually vomiting.  Eating ANYTHING makes her pain and nausea worse.  She is trying to hydrate with pedilyte and water. No constipation or diarrhea.   No known fevers. She was not on any opioids in the weeks prior to these sx;s starting. At a recent ED visit she was rx'd tramadol and this helps when she takes it 1 tab bid or tid.    She went to ED 10/12/18-->CT w/contrast showed some mild enlargement of mesenteric nodes in RLQ, o/w normal.  Dx'd with mesenteric adenitis. An ultrasound was ordered for the next day to further eval for gallstones and this came back normal. Urine eval has been unrevealing.  Lipase normal.  ROS: no CP, no SOB, no wheezing, no cough, no dizziness, no HAs, no rashes, no melena/hematochezia.  No polyuria or polydipsia.  No myalgias or arthralgias.  Past Medical History:  Diagnosis Date  . Abnormal uterine bleeding    Dr. Elonda Husky: OCPs per Dr. Elonda Husky.  . Bipolar disorder [facility])    r/o OCD per psychiatrist 03/2016.  Also, psychologist eval revealed suspected Bipolar II, depressed phase 04/16/2016.  [name] Chronic pain syndrome   . Dysmenorrhea    mgmt per  Dr. Elonda Husky (01/2018).  Nuva Ring vag inserts 07/2018.  [name] GAD (generalized anxiety disorder)   . Infectious mononucleosis 2007   ?whooping cough, too??  . Lumbosacral radiculopathy at L5 2015   [facility]; then 2nd opinion with Dr. Tonita Cong at [facility], where she got ESI left L5-S1 11/2013 with moderate improvement.  Saw neurosurgeon 01/2016: surgery NOT recommended.  PT initiated.  . Migraine syndrome   . Obesity, Class I, BMI 30-34.9   . PCOS (polycystic ovarian syndrome)    Her GYN, Dr. Evie Lacks, is working her up for this.  . Pre-syncope 2016   ? POTS.  Holter showed mostly NSR, occ PACs, one run of nonsustained atrial tachy.  . Salmonella enteritis 09/2017    Past Surgical History:  Procedure Laterality Date  . WISDOM TOOTH EXTRACTION      Outpatient Medications Prior to Visit  Medication Sig Dispense Refill  . acetaminophen (TYLENOL) 500 MG tablet Take 500 mg by mouth every 6 (six) hours as needed for mild pain or moderate pain.    [name] etonogestrel-ethinyl estradiol (NUVARING) 0.12-0.015 MG/24HR vaginal ring Insert vaginally and leave in place for 3 consecutive weeks, then remove for 1 week. (Patient taking differently: Place 1 each vaginally See admin instructions. Insert vaginally and leave in place for 3 consecutive weeks, then remove for 1 week.) 1 each 12  .  famotidine (PEPCID) 20 MG tablet Take 1 tablet (20 mg total) by mouth 2 (two) times daily. 30 tablet 0  . fluticasone (FLONASE) 50 MCG/ACT nasal spray Place into both nostrils daily as needed for allergies or rhinitis.     [name] ibuprofen (ADVIL) 200 MG tablet Take 400 mg by mouth every 6 (six) hours as needed for moderate pain.    [name] lamoTRIgine (LAMICTAL) 100 MG tablet Take 1 tablet (100 mg total) by mouth 2 (two) times daily. 180 tablet 1  . LORazepam (ATIVAN) 0.5 MG tablet Take 1 tablet (0.5 mg total) by mouth every 8 (eight) hours as needed for anxiety. Per psychiatrist 60 tablet 1  . ondansetron  (ZOFRAN) 4 MG tablet Take 1 tablet (4 mg total) by mouth every 6 (six) hours. 12 tablet 0  . pantoprazole (PROTONIX) 20 MG tablet Take 1 tablet (20 mg total) by mouth daily. 15 tablet 1  . promethazine (PHENERGAN) 12.5 MG tablet 1-2 q6h prn nausea 30 tablet 2  . rizatriptan (MAXALT) 10 MG tablet TAKE 1 TABLET (10 MG TOTAL) BY MOUTH AS NEEDED FOR MIGRAINE. MAY REPEAT IN 2 HOURS IF NEEDED 10 tablet 1  . traMADol (ULTRAM) 50 MG tablet Take 1 tablet (50 mg total) by mouth every 6 (six) hours as needed. 12 tablet 0   No facility-administered medications prior to visit.     Allergies  Allergen Reactions  . Fish-Derived Products Nausea And Vomiting    Shell fish make her sick  . Shellfish Allergy Nausea And Vomiting    ROS As per HPI  PE: Blood pressure 101/66, pulse (!) 104, temperature 98.3 F (36.8 C), temperature source Oral, resp. rate 20, height '5\' 4"'  (1.626 m), weight 145 lb 3.2 oz (65.9 kg), last menstrual period 10/12/2018, SpO2 97 %.   Gen: Alert, tired-appearing but NAD.  Patient is oriented to person, place, time, and situation. AFFECT: pleasant but a bit flat.  Lucid thought and speech. KWI:OXBD: no injection, icteris, swelling, or exudate.  EOMI, PERRLA. Mouth: lips without lesion/swelling.  Oral mucosa pink and moist. Oropharynx without erythema, exudate, or swelling.  Neck - No masses or thyromegaly or limitation in range of motion CV: RRR, no m/r/g.   LUNGS: CTA bilat, nonlabored resps, good aeration in all lung fields. ABD: soft, non-distended.  BS a bit hypoactive diffusely.  No involuntary guarding.  She has some voluntary guarding and diffuse TTP w/out rebound.  Due to pt's discomfort I am unable to palpate more than shallow. Unable to feel any mass or HSM.   No CVA tenderness. R>L flank TTP.  Even has TTP over bottom rib or two on R. EXT: no clubbing or cyanosis.  no edema.  Skin: no pallor, jaundice, or rash. Musculoskeletal: no joint swelling, erythema, warmth, or  tenderness.  ROM of all joints intact.  LABS:   Lab Results  Component Value Date   LIPASE 27 10/12/2018    Lab Results  Component Value Date   WBC 10.6 (H) 10/12/2018   HGB 14.6 10/12/2018   HCT 43.2 10/12/2018   MCV 92.7 10/12/2018   PLT 248 10/12/2018     Chemistry      Component Value Date/Time   NA 140 10/12/2018 2059   K 3.9 10/12/2018 2059   CL 105 10/12/2018 2059   CO2 24 10/12/2018 2059   BUN 9 10/12/2018 2059   CREATININE 0.77 10/12/2018 2059      Component Value Date/Time   CALCIUM 9.0 10/12/2018 2059   ALKPHOS  44 10/12/2018 2059   AST 18 10/12/2018 2059   ALT 16 10/12/2018 2059   BILITOT 0.2 (L) 10/12/2018 2059     Imaging:  10/12/18 CT abd/pelv with contrast: IMPRESSION: 1. Clustered right lower quadrant lymph nodes, which may indicate mesenteric adenitis. 2. Non-visualization of the appendix.  No RLQ inflammatory findings. 3. Otherwise normal CT of the abdomen and pelvis.  10/13/18 RUQ abd ultrasound: IMPRESSION: Study within normal limits.  IMPRESSION AND PLAN:  1) Subacute abd pain with nausea.  She gradually continues to worsen since onset of sx's 1 month ago. Initially had more of classic biliary colic presentation.  However, as time has gone by she has more involvement of general abdomen area with pain--still signif worse with trying to eat ANY food.   Labs have been normal (blood and urine, including UPT). Imaging by CT was initially suggestive of dx of mesenteric adenitis, but clinically she is not correlating with this dx. Biliary system normal on CT and ultrasound imaging.   She is on pepcid and low dose PPI and sx's persist. Tramadol mildly helpful for pain, phenergan sometimes helpful for nausea.  Plan: obtain NM GB emptying scan, refer to GI, try zofran to see if it helps with nausea more than phenergan, continue tramadol for pain, push fluids. Repeat CBC w/diff, CMET, lipase.  Also, obtain ESR, H pylori ab.  (Hormone induced GB  dysfunction? (UTD->n/v/abd pain 3-6% of people on nuvaring)  An After Visit Summary was printed and given to the patient.  FOLLOW UP: Return for f/u to be determined based on results of work-up and specialist eval.  Signed:  Crissie Sickles, MD           10/16/2018

## 2018-10-16 ENCOUNTER — Encounter: Payer: Self-pay | Admitting: Family Medicine

## 2018-10-16 ENCOUNTER — Other Ambulatory Visit: Payer: Self-pay

## 2018-10-16 ENCOUNTER — Ambulatory Visit (INDEPENDENT_AMBULATORY_CARE_PROVIDER_SITE_OTHER): Payer: 59 | Admitting: Family Medicine

## 2018-10-16 VITALS — BP 101/66 | HR 104 | Temp 98.3°F | Resp 20 | Ht 64.0 in | Wt 145.2 lb

## 2018-10-16 DIAGNOSIS — R1011 Right upper quadrant pain: Secondary | ICD-10-CM | POA: Diagnosis not present

## 2018-10-16 DIAGNOSIS — R109 Unspecified abdominal pain: Secondary | ICD-10-CM | POA: Diagnosis not present

## 2018-10-16 DIAGNOSIS — R110 Nausea: Secondary | ICD-10-CM

## 2018-10-16 LAB — CBC WITH DIFFERENTIAL/PLATELET
Basophils Absolute: 0 10*3/uL (ref 0.0–0.1)
Basophils Relative: 0.5 % (ref 0.0–3.0)
Eosinophils Absolute: 0.1 10*3/uL (ref 0.0–0.7)
Eosinophils Relative: 1.8 % (ref 0.0–5.0)
HCT: 38.4 % (ref 36.0–46.0)
Hemoglobin: 13.3 g/dL (ref 12.0–15.0)
Lymphocytes Relative: 30.1 % (ref 12.0–46.0)
Lymphs Abs: 1.4 10*3/uL (ref 0.7–4.0)
MCHC: 34.6 g/dL (ref 30.0–36.0)
MCV: 90.2 fl (ref 78.0–100.0)
Monocytes Absolute: 0.5 10*3/uL (ref 0.1–1.0)
Monocytes Relative: 11.5 % (ref 3.0–12.0)
Neutro Abs: 2.5 10*3/uL (ref 1.4–7.7)
Neutrophils Relative %: 56.1 % (ref 43.0–77.0)
Platelets: 259 10*3/uL (ref 150.0–400.0)
RBC: 4.26 Mil/uL (ref 3.87–5.11)
RDW: 12.5 % (ref 11.5–15.5)
WBC: 4.5 10*3/uL (ref 4.0–10.5)

## 2018-10-16 LAB — COMPREHENSIVE METABOLIC PANEL
ALT: 13 U/L (ref 0–35)
AST: 14 U/L (ref 0–37)
Albumin: 3.8 g/dL (ref 3.5–5.2)
Alkaline Phosphatase: 45 U/L (ref 39–117)
BUN: 11 mg/dL (ref 6–23)
CO2: 30 mEq/L (ref 19–32)
Calcium: 9 mg/dL (ref 8.4–10.5)
Chloride: 105 mEq/L (ref 96–112)
Creatinine, Ser: 0.74 mg/dL (ref 0.40–1.20)
GFR: 92.92 mL/min (ref >60.00)
Glucose, Bld: 86 mg/dL (ref 70–99)
Potassium: 4.8 mEq/L (ref 3.5–5.1)
Sodium: 142 mEq/L (ref 135–145)
Total Bilirubin: 0.4 mg/dL (ref 0.2–1.2)
Total Protein: 6.4 g/dL (ref 6.0–8.3)

## 2018-10-16 LAB — LIPASE: Lipase: 14 U/L (ref 11.0–59.0)

## 2018-10-16 LAB — SEDIMENTATION RATE: Sed Rate: 11 mm/hr (ref 0–20)

## 2018-10-16 LAB — H. PYLORI ANTIBODY, IGG: H Pylori IgG: NEGATIVE

## 2018-10-16 MED ORDER — TRAMADOL HCL 50 MG PO TABS: 50.0000 mg | ORAL_TABLET | Freq: Four times a day (QID) | ORAL | 0 refills | Status: DC | PRN

## 2018-10-16 MED ORDER — ONDANSETRON 8 MG PO TBDP: 8.0000 mg | ORAL_TABLET | Freq: Three times a day (TID) | ORAL | 0 refills | Status: DC | PRN

## 2018-10-19 ENCOUNTER — Telehealth: Payer: Self-pay

## 2018-10-19 NOTE — Telephone Encounter (Signed)
My Chart message sent

## 2018-10-20 ENCOUNTER — Telehealth: Payer: Self-pay

## 2018-10-20 ENCOUNTER — Encounter (HOSPITAL_COMMUNITY)
Admission: RE | Admit: 2018-10-20 | Discharge: 2018-10-20 | Disposition: A | Discharge status: Home / Self Care | Payer: 59 | Source: Ambulatory Visit | Attending: Family Medicine | Admitting: Family Medicine

## 2018-10-20 ENCOUNTER — Encounter (HOSPITAL_COMMUNITY): Payer: Self-pay

## 2018-10-20 ENCOUNTER — Other Ambulatory Visit: Payer: Self-pay

## 2018-10-20 DIAGNOSIS — R110 Nausea: Secondary | ICD-10-CM

## 2018-10-20 DIAGNOSIS — R1011 Right upper quadrant pain: Secondary | ICD-10-CM

## 2018-10-20 DIAGNOSIS — R109 Unspecified abdominal pain: Secondary | ICD-10-CM | POA: Insufficient documentation

## 2018-10-20 HISTORY — DX: Unspecified abdominal pain: R10.9

## 2018-10-20 MED ORDER — TECHNETIUM TC 99M MEBROFENIN IV KIT
5.0000 | PACK | Freq: Once | INTRAVENOUS | Status: AC | PRN
  Administered 2018-10-20: 5.5 via INTRAVENOUS

## 2018-10-20 NOTE — Telephone Encounter (Signed)
My Chart message sent

## 2018-10-21 ENCOUNTER — Encounter (INDEPENDENT_AMBULATORY_CARE_PROVIDER_SITE_OTHER): Payer: Self-pay | Admitting: Internal Medicine

## 2018-10-21 ENCOUNTER — Other Ambulatory Visit (INDEPENDENT_AMBULATORY_CARE_PROVIDER_SITE_OTHER): Payer: Self-pay | Admitting: Internal Medicine

## 2018-10-21 ENCOUNTER — Ambulatory Visit (INDEPENDENT_AMBULATORY_CARE_PROVIDER_SITE_OTHER): Payer: 59 | Admitting: Internal Medicine

## 2018-10-21 ENCOUNTER — Other Ambulatory Visit: Payer: Self-pay

## 2018-10-21 VITALS — BP 89/52 | HR 96 | Temp 98.3°F | Ht 64.0 in | Wt 140.0 lb

## 2018-10-21 DIAGNOSIS — R1011 Right upper quadrant pain: Secondary | ICD-10-CM

## 2018-10-21 DIAGNOSIS — R1013 Epigastric pain: Secondary | ICD-10-CM

## 2018-10-21 NOTE — Progress Notes (Signed)
Subjective:    Patient ID: Susan Chandler, female    DOB: 04/30/1991, 28 y.o.   MRN: [id number]  HPI referred by Dr. Milinda CaveMcGowen for abdominal pain. Last saw Dr. Milinda CaveMcGowen 09/21/2018. Felt she had biliary colic. CBC and CMET were normal. She was told to avoid fatty foods. Having RUQ pain.   The pain started in her RUQ 6 weeks ago. She say the pain has now centered in her epigastric region. She says she is still having the pain. She also says she has been having some left sided abdominal pain. In office today she says she is having pain.  She says she has constant nausea. The pain is extremely worse after eating. When she eats, she has pain in her epigastric region. Rates pain at a 6 or 7 out of 10. Really no GERD symptoms.  No fever.  Since being sick her stools have been like soft little pellets.   HIda scan 10/20/2018 revealed IMPRESSION: Normal ejection fraction of radiotracer from the gallbladder. Patient did experience clinical symptoms with the oral Ensure consumption. Cystic and common bile ducts are patent as is evidenced by visualization of gallbladder and small bowel. No NSAIDs. She says the Tramadol helps with her pain.   Seen in the ED 10/12/18 and she underwent a CT which revealed IMPRESSION: 1. Clustered right lower quadrant lymph nodes, which may indicate mesenteric adenitis. 2. Non-visualization of the appendix.  No RLQ inflammatory findings. 3. Otherwise normal CT of the abdomen and pelvis.  US 10/13/2018 revealed: normal study. CBC 2mm.   CBC    Component Value Date/Time   WBC 4.5 10/16/2018 1124   RBC 4.26 10/16/2018 1124   HGB 13.3 10/16/2018 1124   HCT 38.4 10/16/2018 1124   PLT 259.0 10/16/2018 1124   MCV 90.2 10/16/2018 1124   MCH 31.3 10/12/2018 2059   MCHC 34.6 10/16/2018 1124   RDW 12.5 10/16/2018 1124   LYMPHSABS 1.4 10/16/2018 1124   MONOABS 0.5 10/16/2018 1124   EOSABS 0.1 10/16/2018 1124   BASOSABS 0.0 10/16/2018 1124   CMP Latest Ref Rng & Units  10/16/2018 10/12/2018 09/23/2018  Glucose 70 - 99 mg/dL 86 191(Y105(H) 96  BUN 6 - 23 mg/dL 11 9 11   Creatinine 0.40 - 1.20 mg/dL 7.820.74 9.560.77 2.130.79  Sodium 135 - 145 mEq/L 142 140 141  Potassium 3.5 - 5.1 mEq/L 4.8 3.9 4.3  Chloride 96 - 112 mEq/L 105 105 107  CO2 19 - 32 mEq/L 30 24 26   Calcium 8.4 - 10.5 mg/dL 9.0 9.0 9.3  Total Protein 6.0 - 8.3 g/dL 6.4 7.9 6.7  Total Bilirubin 0.2 - 1.2 mg/dL 0.4 0.8(M0.2(L) 0.4  Alkaline Phos 39 - 117 U/L 45 44 34(L)  AST 0 - 37 U/L 14 18 11   ALT 0 - 35 U/L 13 16 8    Lipase     Component Value Date/Time   LIPASE 14.0 10/16/2018 1124   10/16/2018 H. Pylori was negative.  Erythrocyte Sedimentation Rate     Component Value Date/Time   ESRSEDRATE 11 10/16/2018 1124        Review of Systems     Past Medical History:  Diagnosis Date  . Abdominal pain    Persistent Mar/Apr 2020: w/u pretty unrevealing by me (McGowen); pt to see GI 10/21/18.  [name]. Abnormal uterine bleeding    Dr. Despina HiddenEure.  . Bipolar disorder [facility])    r/o OCD per psychiatrist 03/2016.  Also, psychologist eval revealed suspected Bipolar II, depressed phase 04/16/2016.  .[name]  Chronic pain syndrome   . Dysmenorrhea    mgmt per Dr. Despina Hidden (01/2018).  Nuva Ring vag inserts 07/2018.  [name] GAD (generalized anxiety disorder)   . Infectious mononucleosis 2007  . Lumbosacral radiculopathy at L5 2015   [facility] at [facility]; then 2nd opinion with Dr. Shelle Iron at [facility] ortho, where she got ESI left L5-S1 11/2013 with moderate improvement.  Saw neurosurgeon 01/2016: surgery NOT recommended.  PT initiated.  . Migraine syndrome   . Obesity, Class I, BMI 30-34.9   . Pre-syncope 2016   ? POTS.  Holter showed mostly NSR, occ PACs, one run of nonsustained atrial tachy.  . Salmonella enteritis 09/2017    Past Surgical History:  Procedure Laterality Date  . WISDOM TOOTH EXTRACTION      Allergies  Allergen Reactions  . Fish-Derived Products Nausea And Vomiting    Shell fish make her sick  .  Shellfish Allergy Nausea And Vomiting    Current Outpatient Medications on File Prior to Visit  Medication Sig Dispense Refill  . acetaminophen (TYLENOL) 500 MG tablet Take 500 mg by mouth every 6 (six) hours as needed for mild pain or moderate pain.    [name] etonogestrel-ethinyl estradiol (NUVARING) 0.12-0.015 MG/24HR vaginal ring Insert vaginally and leave in place for 3 consecutive weeks, then remove for 1 week. (Patient taking differently: Place 1 each vaginally See admin instructions. Insert vaginally and leave in place for 3 consecutive weeks, then remove for 1 week.) 1 each 12  . famotidine (PEPCID) 20 MG tablet Take 1 tablet (20 mg total) by mouth 2 (two) times daily. 30 tablet 0  . fluticasone (FLONASE) 50 MCG/ACT nasal spray Place into both nostrils daily as needed for allergies or rhinitis.     [name] ibuprofen (ADVIL) 200 MG tablet Take 400 mg by mouth every 6 (six) hours as needed for moderate pain.    [name] lamoTRIgine (LAMICTAL) 100 MG tablet Take 1 tablet (100 mg total) by mouth 2 (two) times daily. 180 tablet 1  . LORazepam (ATIVAN) 0.5 MG tablet Take 1 tablet (0.5 mg total) by mouth every 8 (eight) hours as needed for anxiety. Per psychiatrist 60 tablet 1  . ondansetron (ZOFRAN) 4 MG tablet Take 1 tablet (4 mg total) by mouth every 6 (six) hours. 12 tablet 0  . pantoprazole (PROTONIX) 20 MG tablet Take 1 tablet (20 mg total) by mouth daily. 15 tablet 1  . promethazine (PHENERGAN) 12.5 MG tablet 1-2 q6h prn nausea 30 tablet 2  . rizatriptan (MAXALT) 10 MG tablet TAKE 1 TABLET (10 MG TOTAL) BY MOUTH AS NEEDED FOR MIGRAINE. MAY REPEAT IN 2 HOURS IF NEEDED 10 tablet 1  . traMADol (ULTRAM) 50 MG tablet Take 1 tablet (50 mg total) by mouth every 6 (six) hours as needed. 20 tablet 0   No current facility-administered medications on file prior to visit.      Objective:   Physical Exam Blood pressure (!) 89/52, pulse 96, temperature 98.3 F (36.8 C), height 5\' 4"  (1.626 m), weight 140 lb (63.5  kg), last menstrual period 10/20/2018.  Alert and oriented. Skin is slightly moist. . Oral mucosa is moist.   . Sclera anicteric, conjunctivae is pink. Thyroid not enlarged. No cervical lymphadenopathy. Lungs clear. Heart regular rate and rhythm.  Abdomen is soft. Bowel sounds are positive. No hepatomegaly. No abdominal masses felt. Epigastric  tenderness.  No edema to lower extremities.        Assessment & Plan:  RUQ pain,  Epigastric pain. I discussed with Dr. Karilyn Cota. EGD next week with propofol

## 2018-10-21 NOTE — Patient Instructions (Addendum)
EGD with propofol. The risks of bleeding, perforation and infection were reviewed with patient. Take Protonix in am and the Pepcid at night.

## 2018-10-23 ENCOUNTER — Encounter (HOSPITAL_COMMUNITY)
Admission: RE | Admit: 2018-10-23 | Discharge: 2018-10-23 | Disposition: A | Discharge status: Home / Self Care | Payer: 59 | Source: Ambulatory Visit | Attending: Internal Medicine | Admitting: Internal Medicine

## 2018-10-23 ENCOUNTER — Encounter (HOSPITAL_COMMUNITY): Payer: Self-pay

## 2018-10-23 ENCOUNTER — Other Ambulatory Visit: Payer: Self-pay

## 2018-10-23 DIAGNOSIS — K3189 Other diseases of stomach and duodenum: Secondary | ICD-10-CM

## 2018-10-23 DIAGNOSIS — Z01812 Encounter for preprocedural laboratory examination: Secondary | ICD-10-CM | POA: Diagnosis present

## 2018-10-23 HISTORY — PX: ESOPHAGOGASTRODUODENOSCOPY: SHX1529

## 2018-10-23 HISTORY — DX: Other diseases of stomach and duodenum: K31.89

## 2018-10-26 ENCOUNTER — Other Ambulatory Visit: Payer: Self-pay

## 2018-10-26 ENCOUNTER — Ambulatory Visit (HOSPITAL_COMMUNITY)
Admission: RE | Admit: 2018-10-26 | Discharge: 2018-10-26 | Disposition: A | Discharge status: Home / Self Care | Payer: 59 | Attending: Internal Medicine | Admitting: Internal Medicine

## 2018-10-26 ENCOUNTER — Ambulatory Visit (HOSPITAL_COMMUNITY): Payer: 59 | Admitting: Anesthesiology

## 2018-10-26 ENCOUNTER — Encounter (HOSPITAL_COMMUNITY): Payer: Self-pay

## 2018-10-26 ENCOUNTER — Encounter (HOSPITAL_COMMUNITY)
Admission: RE | Disposition: A | Discharge status: Home / Self Care | Payer: Self-pay | Source: Home / Self Care | Attending: Internal Medicine

## 2018-10-26 DIAGNOSIS — F319 Bipolar disorder, unspecified: Secondary | ICD-10-CM | POA: Insufficient documentation

## 2018-10-26 DIAGNOSIS — E669 Obesity, unspecified: Secondary | ICD-10-CM | POA: Insufficient documentation

## 2018-10-26 DIAGNOSIS — Z7951 Long term (current) use of inhaled steroids: Secondary | ICD-10-CM | POA: Insufficient documentation

## 2018-10-26 DIAGNOSIS — R634 Abnormal weight loss: Secondary | ICD-10-CM | POA: Insufficient documentation

## 2018-10-26 DIAGNOSIS — R1011 Right upper quadrant pain: Secondary | ICD-10-CM | POA: Insufficient documentation

## 2018-10-26 DIAGNOSIS — R110 Nausea: Secondary | ICD-10-CM | POA: Diagnosis not present

## 2018-10-26 DIAGNOSIS — K449 Diaphragmatic hernia without obstruction or gangrene: Secondary | ICD-10-CM | POA: Insufficient documentation

## 2018-10-26 DIAGNOSIS — Z79899 Other long term (current) drug therapy: Secondary | ICD-10-CM | POA: Diagnosis not present

## 2018-10-26 DIAGNOSIS — K228 Other specified diseases of esophagus: Secondary | ICD-10-CM

## 2018-10-26 DIAGNOSIS — R1013 Epigastric pain: Secondary | ICD-10-CM | POA: Insufficient documentation

## 2018-10-26 DIAGNOSIS — K3189 Other diseases of stomach and duodenum: Secondary | ICD-10-CM | POA: Insufficient documentation

## 2018-10-26 DIAGNOSIS — Z6830 Body mass index (BMI) 30.0-30.9, adult: Secondary | ICD-10-CM | POA: Diagnosis not present

## 2018-10-26 DIAGNOSIS — G894 Chronic pain syndrome: Secondary | ICD-10-CM | POA: Diagnosis not present

## 2018-10-26 HISTORY — PX: ESOPHAGOGASTRODUODENOSCOPY (EGD) WITH PROPOFOL: SHX5813

## 2018-10-26 HISTORY — PX: BIOPSY: SHX5522

## 2018-10-26 LAB — RAPID URINE DRUG SCREEN, HOSP PERFORMED
Amphetamines: NOT DETECTED
Barbiturates: NOT DETECTED
Benzodiazepines: NOT DETECTED
Cocaine: NOT DETECTED
Opiates: NOT DETECTED
Tetrahydrocannabinol: NOT DETECTED

## 2018-10-26 LAB — PREGNANCY, URINE: Preg Test, Ur: NEGATIVE

## 2018-10-26 SURGERY — ESOPHAGOGASTRODUODENOSCOPY (EGD) WITH PROPOFOL
Anesthesia: General

## 2018-10-26 MED ORDER — CHLORHEXIDINE GLUCONATE CLOTH 2 % EX PADS: 6.0000 | MEDICATED_PAD | Freq: Once | CUTANEOUS | Status: DC

## 2018-10-26 MED ORDER — PROMETHAZINE HCL 25 MG/ML IJ SOLN: 6.2500 mg | INTRAMUSCULAR | Status: DC | PRN

## 2018-10-26 MED ORDER — HYDROMORPHONE HCL 1 MG/ML IJ SOLN: 0.2500 mg | INTRAMUSCULAR | Status: DC | PRN

## 2018-10-26 MED ORDER — LACTATED RINGERS IV SOLN
INTRAVENOUS | Status: DC
  Administered 2018-10-26: 07:00:00 via INTRAVENOUS

## 2018-10-26 MED ORDER — PROPOFOL 10 MG/ML IV BOLUS
INTRAVENOUS | Status: DC | PRN
  Administered 2018-10-26: 15 mg via INTRAVENOUS

## 2018-10-26 MED ORDER — MIDAZOLAM HCL 2 MG/2ML IJ SOLN
INTRAMUSCULAR | Status: AC
  Filled 2018-10-26: qty 2

## 2018-10-26 MED ORDER — PROPOFOL 10 MG/ML IV BOLUS
INTRAVENOUS | Status: AC
  Filled 2018-10-26: qty 40

## 2018-10-26 MED ORDER — HYDROCODONE-ACETAMINOPHEN 7.5-325 MG PO TABS: 1.0000 | ORAL_TABLET | Freq: Once | ORAL | Status: DC | PRN

## 2018-10-26 MED ORDER — KETAMINE HCL 10 MG/ML IJ SOLN
INTRAMUSCULAR | Status: DC | PRN
  Administered 2018-10-26: 5 mg via INTRAVENOUS
  Administered 2018-10-26: 15 mg via INTRAVENOUS
  Administered 2018-10-26: 5 mg via INTRAVENOUS

## 2018-10-26 MED ORDER — MIDAZOLAM HCL 5 MG/5ML IJ SOLN
INTRAMUSCULAR | Status: DC | PRN
  Administered 2018-10-26: 2 mg via INTRAVENOUS

## 2018-10-26 MED ORDER — PANTOPRAZOLE SODIUM 20 MG PO TBEC: 20.0000 mg | DELAYED_RELEASE_TABLET | Freq: Every day | ORAL | 2 refills | Status: DC

## 2018-10-26 MED ORDER — MIDAZOLAM HCL 2 MG/2ML IJ SOLN: 0.5000 mg | Freq: Once | INTRAMUSCULAR | Status: DC | PRN

## 2018-10-26 MED ORDER — ONDANSETRON HCL 4 MG PO TABS: 4.0000 mg | ORAL_TABLET | Freq: Three times a day (TID) | ORAL | 1 refills | Status: DC | PRN

## 2018-10-26 MED ORDER — KETAMINE HCL 50 MG/5ML IJ SOSY
PREFILLED_SYRINGE | INTRAMUSCULAR | Status: AC
  Filled 2018-10-26: qty 5

## 2018-10-26 MED ORDER — PROPOFOL 500 MG/50ML IV EMUL
INTRAVENOUS | Status: DC | PRN
  Administered 2018-10-26: 125 ug/kg/min via INTRAVENOUS

## 2018-10-26 NOTE — Transfer of Care (Signed)
Immediate Anesthesia Transfer of Care Note  Patient: Susan Chandler  Procedure(s) Performed: ESOPHAGOGASTRODUODENOSCOPY (EGD) WITH PROPOFOL (N/A ) BIOPSY  Patient Location: PACU  Anesthesia Type:General  Level of Consciousness: awake and patient cooperative  Airway & Oxygen Therapy: Patient Spontanous Breathing and Patient connected to nasal cannula oxygen  Post-op Assessment: Report given to RN, Post -op Vital signs reviewed and stable and Patient moving all extremities  Post vital signs: Reviewed and stable  Last Vitals:  Vitals Value Taken Time  BP    Temp    Pulse 118 10/26/2018  8:03 AM  Resp 16 10/26/2018  8:03 AM  SpO2 100 % 10/26/2018  8:03 AM  Vitals shown include unvalidated device data.  Last Pain:  Vitals:   10/26/18 0653  TempSrc: Oral  PainSc: 0-No pain         Complications: No apparent anesthesia complications

## 2018-10-26 NOTE — H&P (Addendum)
Susan Chandler is an 28 y.o. female.   Chief Complaint: Patient is here for EGD. HPI: Patient is a 28 year old Caucasian female who presents with 2 months history of upper abdominal pain which started in right upper quadrant and now has migrated to mid epigastric region.  Patient was seen in emergency room 2 weeks ago.  Blood work and abdominal pelvic CT was unremarkable.  She was begun on pantoprazole not feel any better.  Pain is worse with meals.  He has had nausea but no vomiting.  She is afraid to eat because it makes the pain worse.  She has lost 8 pounds.  She states since she is not eating her constipation has gotten worse.  She denies melena or rectal bleeding.  She has chronic back pain and takes Naprosyn 1 to 2 tablets once or twice a week.  At times she may take ibuprofen instead of Naprosyn.  No history of peptic ulcer disease.  She denies heartburn. Other studies include normal right upper quadrant ultrasound and HIDA scan with normal EF.  LFTs repeated these are normal. She states she worries a lot.  She does not smoke cigarettes or drink alcohol.  Past Medical History:  Diagnosis Date  . Abdominal pain    Persistent Mar/Apr 2020: w/u pretty unrevealing by me (McGowen); pt to see GI 10/21/18.  [name] Abnormal uterine bleeding    Dr. Despina Hidden.  . Bipolar disorder [facility])    r/o OCD per psychiatrist 03/2016.  Also, psychologist eval revealed suspected Bipolar II, depressed phase 04/16/2016.  [name] Chronic pain syndrome   . Dysmenorrhea    mgmt per Dr. Despina Hidden (01/2018).  Nuva Ring vag inserts 07/2018.  [name] GAD (generalized anxiety disorder)   . Infectious mononucleosis 2007  . Lumbosacral radiculopathy at L5 2015   [facility] at [facility]; then 2nd opinion with Dr. Shelle Iron at [facility], where she got ESI left L5-S1 11/2013 with moderate improvement.  Saw neurosurgeon 01/2016: surgery NOT recommended.  PT initiated.  . Migraine syndrome   . Obesity, Class I, BMI 30-34.9   . Pre-syncope  2016   ? POTS.  Holter showed mostly NSR, occ PACs, one run of nonsustained atrial tachy.  . Salmonella enteritis 09/2017    Past Surgical History:  Procedure Laterality Date  . WISDOM TOOTH EXTRACTION      Family History  Problem Relation Age of Onset  . Cancer Maternal Aunt   . Alcohol abuse Maternal Grandfather   . Bipolar disorder Father   . Bipolar disorder Sister    Social History:  reports that she has never smoked. She has never used smokeless tobacco. She reports that she does not drink alcohol or use drugs.  Allergies:  Allergies  Allergen Reactions  . Fish-Derived Products Nausea And Vomiting    Shell fish make her sick  . Shellfish Allergy Nausea And Vomiting    Medications Prior to Admission  Medication Sig Dispense Refill  . acetaminophen (TYLENOL) 500 MG tablet Take 500 mg by mouth every 6 (six) hours as needed for mild pain or moderate pain.    . famotidine (PEPCID) 20 MG tablet Take 1 tablet (20 mg total) by mouth 2 (two) times daily. 30 tablet 0  . fluticasone (FLONASE) 50 MCG/ACT nasal spray Place into both nostrils daily as needed for allergies or rhinitis.     [name] ibuprofen (ADVIL) 200 MG tablet Take 400 mg by mouth every 6 (six) hours as needed for moderate pain.    [name]  lamoTRIgine (LAMICTAL) 100 MG tablet Take 1 tablet (100 mg total) by mouth 2 (two) times daily. 180 tablet 1  . LORazepam (ATIVAN) 0.5 MG tablet Take 1 tablet (0.5 mg total) by mouth every 8 (eight) hours as needed for anxiety. Per psychiatrist 60 tablet 1  . ondansetron (ZOFRAN) 4 MG tablet Take 1 tablet (4 mg total) by mouth every 6 (six) hours. 12 tablet 0  . pantoprazole (PROTONIX) 20 MG tablet Take 1 tablet (20 mg total) by mouth daily. 15 tablet 1  . promethazine (PHENERGAN) 12.5 MG tablet 1-2 q6h prn nausea 30 tablet 2  . rizatriptan (MAXALT) 10 MG tablet TAKE 1 TABLET (10 MG TOTAL) BY MOUTH AS NEEDED FOR MIGRAINE. MAY REPEAT IN 2 HOURS IF NEEDED 10 tablet 1  . traMADol (ULTRAM) 50 MG  tablet Take 1 tablet (50 mg total) by mouth every 6 (six) hours as needed. 20 tablet 0  . etonogestrel-ethinyl estradiol (NUVARING) 0.12-0.015 MG/24HR vaginal ring Insert vaginally and leave in place for 3 consecutive weeks, then remove for 1 week. (Patient taking differently: Place 1 each vaginally See admin instructions. Insert vaginally and leave in place for 3 consecutive weeks, then remove for 1 week.) 1 each 12    Results for orders placed or performed during the hospital encounter of 10/26/18 (from the past 48 hour(s))  Rapid urine drug screen (hospital performed)     Status: None   Collection Time: 10/26/18  6:49 AM  Result Value Ref Range   Opiates NONE DETECTED NONE DETECTED   Cocaine NONE DETECTED NONE DETECTED   Benzodiazepines NONE DETECTED NONE DETECTED   Amphetamines NONE DETECTED NONE DETECTED   Tetrahydrocannabinol NONE DETECTED NONE DETECTED   Barbiturates NONE DETECTED NONE DETECTED    Comment: (NOTE) DRUG SCREEN FOR MEDICAL PURPOSES ONLY.  IF CONFIRMATION IS NEEDED FOR ANY PURPOSE, NOTIFY LAB WITHIN 5 DAYS. LOWEST DETECTABLE LIMITS FOR URINE DRUG SCREEN Drug Class                     Cutoff (ng/mL) Amphetamine and metabolites    1000 Barbiturate and metabolites    200 Benzodiazepine                 200 Tricyclics and metabolites     300 Opiates and metabolites        300 Cocaine and metabolites        300 THC                            50 Performed at [facility], [address], [place], [place] [address]    No results found.  ROS  Blood pressure 119/69, pulse 89, temperature 99.3 F (37.4 C), temperature source Oral, resp. rate (!) 22, last menstrual period 10/20/2018, SpO2 99 %. Physical Exam  Constitutional: She appears well-developed and well-nourished.  HENT:  Mouth/Throat: Oropharynx is clear and moist.  Eyes: Conjunctivae are normal. No scleral icterus.  Neck: No thyromegaly present.  Cardiovascular: Normal rate, regular rhythm and normal heart  sounds.  No murmur heard. Respiratory: Effort normal and breath sounds normal.  GI:  Abdomen is flat.  On palpation is soft.  She has mild midepigastric tenderness.  She also has mild tenderness in hypogastric region to the right of midline.  No organomegaly or masses.  Musculoskeletal:        General: No edema.  Neurological: She is alert.  Skin: Skin is warm and dry.  Assessment/Plan Epigastric pain associated nausea and weight loss unresponsive to therapy. Negative work-up including LFTs CT ultrasound and HIDA scan. Diagnostic EGD.Lionel December.  Chevonne Bostrom, MD 10/26/2018, 7:27 AM

## 2018-10-26 NOTE — Anesthesia Preprocedure Evaluation (Addendum)
Anesthesia Evaluation  Patient identified by MRN, date of birth, ID band Patient awake  General Assessment Comment:Sister with Liver transplant had increased needs remotely  Reviewed: Allergy & Precautions, NPO status , Patient's Chart, lab work & pertinent test results  History of Anesthesia Complications (+) Family history of anesthesia reaction and history of anesthetic complications  Airway Mallampati: II  TM Distance: >3 FB Neck ROM: Full    Dental no notable dental hx. (+) Teeth Intact   Pulmonary neg pulmonary ROS,    Pulmonary exam normal breath sounds clear to auscultation       Cardiovascular Exercise Tolerance: Good negative cardio ROS Normal cardiovascular examI Rhythm:Regular Rate:Normal     Neuro/Psych  Headaches, Anxiety Depression Bipolar Disorder  Neuromuscular disease negative psych ROS   GI/Hepatic Neg liver ROS, GERD  Medicated and Controlled,  Endo/Other  negative endocrine ROS  Renal/GU negative Renal ROS  negative genitourinary   Musculoskeletal negative musculoskeletal ROS (+)   Abdominal   Peds negative pediatric ROS (+)  Hematology negative hematology ROS (+)   Anesthesia Other Findings   Reproductive/Obstetrics negative OB ROS                             Anesthesia Physical Anesthesia Plan  ASA: II  Anesthesia Plan: General   Post-op Pain Management:    Induction: Intravenous  PONV Risk Score and Plan:   Airway Management Planned: Nasal Cannula and Simple Face Mask  Additional Equipment:   Intra-op Plan:   Post-operative Plan:   Informed Consent: I have reviewed the patients History and Physical, chart, labs and discussed the procedure including the risks, benefits and alternatives for the proposed anesthesia with the patient or authorized representative who has indicated his/her understanding and acceptance.     Dental advisory given  Plan  Discussed with: CRNA  Anesthesia Plan Comments: (Full PPE use planned  GA planned  GETA as needed  D/W pt -WTP with same after Q&A Dr. Elvera Lennox. Deemed case appropriate in light of COVID concerns  )        Anesthesia Quick Evaluation

## 2018-10-26 NOTE — Anesthesia Postprocedure Evaluation (Signed)
Anesthesia Post Note  Patient: Susan Chandler  Procedure(s) Performed: ESOPHAGOGASTRODUODENOSCOPY (EGD) WITH PROPOFOL (N/A ) BIOPSY  Patient location during evaluation: PACU Anesthesia Type: General Level of consciousness: awake and patient cooperative Pain management: pain level controlled Vital Signs Assessment: post-procedure vital signs reviewed and stable Respiratory status: spontaneous breathing, nonlabored ventilation and respiratory function stable Cardiovascular status: blood pressure returned to baseline Postop Assessment: no apparent nausea or vomiting Anesthetic complications: no     Last Vitals:  Vitals:   10/26/18 0653  BP: 119/69  Pulse: 89  Resp: (!) 22  Temp: 37.4 C  SpO2: 99%    Last Pain:  Vitals:   10/26/18 0653  TempSrc: Oral  PainSc: 0-No pain                 Miakoda Mcmillion J

## 2018-10-26 NOTE — Op Note (Signed)
[facility] Patient Name: Susan Chandler Procedure Date: 10/26/2018 7:19 AM MRN: [id number] Date of Birth: [birth year] Attending MD: Lionel December , MD CSN: [id number] Age: 28 Admit Type: Outpatient Procedure:                Upper GI endoscopy Indications:              Epigastric abdominal pain, Abdominal pain in the                            right upper quadrant, Nausea, Weight loss Providers:                Lionel December, MD, Jannett Celestine, RN, Pandora Leiter,                            Technician, Edythe Clarity, Technician Referring MD:             Jeoffrey Massed, MD Medicines:                Propofol per Anesthesia Complications:            No immediate complications. Estimated Blood Loss:     Estimated blood loss was minimal. Procedure:                Pre-Anesthesia Assessment:                           - Prior to the procedure, a History and Physical                            was performed, and patient medications and                            allergies were reviewed. The patient's tolerance of                            previous anesthesia was also reviewed. The risks                            and benefits of the procedure and the sedation                            options and risks were discussed with the patient.                            All questions were answered, and informed consent                            was obtained. Prior Anticoagulants: The patient has                            taken no previous anticoagulant or antiplatelet                            agents except for NSAID medication. ASA Grade  Assessment: II - A patient with mild systemic                            disease. After reviewing the risks and benefits,                            the patient was deemed in satisfactory condition to                            undergo the procedure.                           After obtaining informed consent, the endoscope was                 passed under direct vision. Throughout the                            procedure, the patient's blood pressure, pulse, and                            oxygen saturations were monitored continuously. The                            GIF-H190 ([id number]) scope was introduced through the                            mouth, and advanced to the second part of duodenum.                            The upper GI endoscopy was accomplished without                            difficulty. The patient tolerated the procedure                            well. Scope In: 7:47:58 AM Scope Out: 7:55:53 AM Total Procedure Duration: 0 hours 7 minutes 55 seconds  Findings:      The examined esophagus was normal.      The Z-line was irregular and was found 38 cm from the incisors.      A 2 cm hiatal hernia was present.      A medium amount of food (residue) was found in the gastric body.      A single, non-bleeding erosion was found in the prepyloric region of the       stomach. There were no stigmata of recent bleeding. Biopsies were taken       with a cold forceps for histology.      The exam of the stomach was otherwise normal.      The duodenal bulb and second portion of the duodenum were normal. Impression:               - Normal esophagus.                           - Z-line irregular, 38 cm from the incisors.                           -  2 cm hiatal hernia.                           - A medium amount of food (residue) in the stomach.                           - Non-bleeding erosive gastropathy. Biopsied.                           - Normal duodenal bulb and second portion of the                            duodenum. Moderate Sedation:      Per Anesthesia Care Recommendation:           - Patient has a contact number available for                            emergencies. The signs and symptoms of potential                            delayed complications were discussed with the                             patient. Return to normal activities tomorrow.                            Written discharge instructions were provided to the                            patient.                           - Clear liquid diet today.                           - Low fat diet starting tomorrow.                           - Continue present medications.                           - No aspirin, ibuprofen, naproxen, or other                            non-steroidal anti-inflammatory drugs for 1 day.                           - Keep Ibuprofen use to minimum.                           - Await pathology results. Procedure Code(s):        --- Professional ---                           [phone], Esophagogastroduodenoscopy, flexible,  transoral; with biopsy, single or multiple Diagnosis Code(s):        --- Professional ---                           K22.8, Other specified diseases of esophagus                           K44.9, Diaphragmatic hernia without obstruction or                            gangrene                           K31.89, Other diseases of stomach and duodenum                           R10.13, Epigastric pain                           R10.11, Right upper quadrant pain                           R11.0, Nausea                           R63.4, Abnormal weight loss CPT copyright 2019 American Medical Association. All rights reserved. The codes documented in this report are preliminary and upon coder review may  be revised to meet current compliance requirements. Lionel DecemberNajeeb Ervin Rothbauer, MD Lionel DecemberNajeeb Mariellen Blaney, MD 10/26/2018 8:23:54 AM This report has been signed electronically. Number of Addenda: 0

## 2018-10-26 NOTE — Discharge Instructions (Signed)
No aspirin or NSAIDs for 24 hours. Keep ibuprofen use to minimum. Pantoprazole 40 mg by mouth 30 minutes before breakfast daily. Use ondansetron for nausea as first-line therapy.  Can take it 30 minutes before each meal. If ondansetron does not help with nausea then use promethazine. Resume other medications as before. Clear liquids today.  Then low-fat diet. No driving for 24 hours. Physician will call with biopsy results and further recommendations.

## 2018-10-27 ENCOUNTER — Encounter: Payer: Self-pay | Admitting: Family Medicine

## 2018-10-28 ENCOUNTER — Encounter (HOSPITAL_COMMUNITY): Payer: Self-pay | Admitting: Internal Medicine

## 2018-10-29 ENCOUNTER — Other Ambulatory Visit (INDEPENDENT_AMBULATORY_CARE_PROVIDER_SITE_OTHER): Payer: Self-pay | Admitting: *Deleted

## 2018-10-29 DIAGNOSIS — R1013 Epigastric pain: Secondary | ICD-10-CM

## 2018-10-30 ENCOUNTER — Other Ambulatory Visit (HOSPITAL_COMMUNITY): Payer: 59

## 2018-11-03 ENCOUNTER — Encounter (HOSPITAL_COMMUNITY)
Admission: RE | Admit: 2018-11-03 | Discharge: 2018-11-03 | Disposition: A | Discharge status: Home / Self Care | Payer: 59 | Source: Ambulatory Visit | Attending: Internal Medicine | Admitting: Internal Medicine

## 2018-11-03 ENCOUNTER — Other Ambulatory Visit: Payer: Self-pay

## 2018-11-03 ENCOUNTER — Encounter (HOSPITAL_COMMUNITY): Payer: Self-pay

## 2018-11-03 DIAGNOSIS — R1013 Epigastric pain: Secondary | ICD-10-CM | POA: Diagnosis not present

## 2018-11-03 DIAGNOSIS — R112 Nausea with vomiting, unspecified: Secondary | ICD-10-CM | POA: Diagnosis not present

## 2018-11-03 DIAGNOSIS — R1011 Right upper quadrant pain: Secondary | ICD-10-CM | POA: Diagnosis not present

## 2018-11-03 MED ORDER — TECHNETIUM TC 99M SULFUR COLLOID
2.0000 | Freq: Once | INTRAVENOUS | Status: AC | PRN
  Administered 2018-11-03: 1.88 via ORAL

## 2018-11-04 ENCOUNTER — Other Ambulatory Visit (INDEPENDENT_AMBULATORY_CARE_PROVIDER_SITE_OTHER): Payer: Self-pay | Admitting: Internal Medicine

## 2018-11-04 MED ORDER — PANTOPRAZOLE SODIUM 40 MG PO TBEC: 40.0000 mg | DELAYED_RELEASE_TABLET | Freq: Every day | ORAL | 5 refills | Status: DC

## 2018-11-06 DIAGNOSIS — Z01812 Encounter for preprocedural laboratory examination: Secondary | ICD-10-CM | POA: Insufficient documentation

## 2019-04-13 ENCOUNTER — Other Ambulatory Visit: Payer: Self-pay | Admitting: Family Medicine

## 2019-08-23 ENCOUNTER — Other Ambulatory Visit: Payer: Self-pay | Admitting: Obstetrics & Gynecology

## 2019-08-26 ENCOUNTER — Encounter: Payer: Self-pay | Admitting: Family Medicine

## 2019-08-26 ENCOUNTER — Ambulatory Visit (INDEPENDENT_AMBULATORY_CARE_PROVIDER_SITE_OTHER): Payer: 59 | Admitting: Family Medicine

## 2019-08-26 ENCOUNTER — Other Ambulatory Visit: Payer: Self-pay

## 2019-08-26 VITALS — Wt 145.0 lb

## 2019-08-26 DIAGNOSIS — K5909 Other constipation: Secondary | ICD-10-CM | POA: Diagnosis not present

## 2019-08-26 NOTE — Progress Notes (Signed)
Virtual Visit via Video Note  I connected with pt on 08/26/19 at  4:00 PM EST by a video enabled telemedicine application and verified that I am speaking with the correct person using two identifiers.  Location patient: home Location provider:work or home office Persons participating in the virtual visit: patient, provider  I discussed the limitations of evaluation and management by telemedicine and the availability of in person appointments. The patient expressed understanding and agreed to proceed.  Telemedicine visit is a necessity given the COVID-19 restrictions in place at the current time.  HPI: 29 y/o WF being seen today for constipation. Ongoing for about the last year, infrequent and small BMs that often require lots of straining and occ post-evacuation BRBPR.  NO anal pain with BM.  +Bloating and discomfort in rectal area makes her feel like she has to have bm much of the time.  No nausea or abd pain.  Appetite is fine.  Has been cutting out sweets, fructose drinks, and trying to increase water intake.  Tried miralax powder bid for a while but no help.  Daily fiber supplement no help.  Enema not helpful x 2 tries. Prior to feeling prob with constipation she denies feeling any tendency towards bloating or early satiety.  Not much belching or passing gas per rectum.  She has not been on any opioid pain med in over a year.   PMP AWARE reviewed today: most recent rx for tramadol 50mg  was filled 10/16/18, # 20, rx by me. Last rx for lorazepam 0.5mg  was filled 10/14/18, #60, by me. Last rx for tylox 5/325 was filled 01/19/18, #30, by me. No red flags.  ROS: See pertinent positives and negatives per HPI.  Past Medical History:  Diagnosis Date  . Abdominal pain    Persistent Mar/Apr 2020: w/u pretty unrevealing by me (Shefali Ng); pt to see GI 10/21/18.  10/23/18 Abnormal uterine bleeding    Dr. [name].  . Bipolar disorder [facility])    r/o OCD per psychiatrist 03/2016.  Also, psychologist eval revealed  suspected Bipolar II, depressed phase 04/16/2016.  04/18/2016 Chronic pain syndrome   . Dysmenorrhea    mgmt per Dr. [name] (01/2018).  Nuva Ring vag inserts 07/2018.  08/2018 Erosive gastropathy 10/2018   Nonbleeding (EGD 10/2018)  . GAD (generalized anxiety disorder)   . Infectious mononucleosis 2007  . Lumbosacral radiculopathy at L5 2015   [facility]; then 2nd opinion with Dr. Retia Passe at [facility], where she got ESI left L5-S1 11/2013 with moderate improvement.  Saw neurosurgeon 01/2016: surgery NOT recommended.  PT initiated.  . Migraine syndrome   . Obesity, Class I, BMI 30-34.9   . Pre-syncope 2016   ? POTS.  Holter showed mostly NSR, occ PACs, one run of nonsustained atrial tachy.  . Salmonella enteritis 09/2017    Past Surgical History:  Procedure Laterality Date  . BIOPSY  10/26/2018   Procedure: BIOPSY;  Surgeon: 12/26/2018, MD;  Location: [facility];  Service: Endoscopy;;  . ESOPHAGOGASTRODUODENOSCOPY  10/2018   Nonbleeding erosive gastropathy, H pylori NEG.  . ESOPHAGOGASTRODUODENOSCOPY (EGD) WITH PROPOFOL N/A 10/26/2018   Procedure: ESOPHAGOGASTRODUODENOSCOPY (EGD) WITH PROPOFOL;  Surgeon: 12/26/2018, MD;  Location: [facility];  Service: Endoscopy;  Laterality: N/A;  . WISDOM TOOTH EXTRACTION      Family History  Problem Relation Age of Onset  . Cancer Maternal Aunt   . Alcohol abuse Maternal Grandfather   . Bipolar disorder Father   .  Bipolar disorder Sister     SOCIAL HX:  Social History   Socioeconomic History  . Marital status: Married    Spouse name: Not on file  . Number of children: Not on file  . Years of education: Not on file  . Highest education level: Not on file  Occupational History  . Not on file  Tobacco Use  . Smoking status: Never Smoker  . Smokeless tobacco: Never Used  Substance and Sexual Activity  . Alcohol use: No    Comment: 04-05-2016 per pt no  . Drug use: No    Comment: 04-05-2016 per pt no   .  Sexual activity: Yes    Birth control/protection: Pill  Other Topics Concern  . Not on file  Social History Narrative   Married, no children.   College: [employer].   Occupation: Used to work at [employer] in [employer]   Moved to [place], [place] in 2015.   No T/A/Ds.   No exercise.   Social Determinants of Health   Financial Resource Strain:   . Difficulty of Paying Living Expenses: Not on file  Food Insecurity:   . Worried About Programme researcher, broadcasting/film/video in the Last Year: Not on file  . Ran Out of Food in the Last Year: Not on file  Transportation Needs:   . Lack of Transportation (Medical): Not on file  . Lack of Transportation (Non-Medical): Not on file  Physical Activity:   . Days of Exercise per Week: Not on file  . Minutes of Exercise per Session: Not on file  Stress:   . Feeling of Stress : Not on file  Social Connections:   . Frequency of Communication with Friends and Family: Not on file  . Frequency of Social Gatherings with Friends and Family: Not on file  . Attends Religious Services: Not on file  . Active Member of Clubs or Organizations: Not on file  . Attends Banker Meetings: Not on file  . Marital Status: Not on file      Current Outpatient Medications:  .  acetaminophen (TYLENOL) 500 MG tablet, Take 500 mg by mouth every 6 (six) hours as needed for mild pain or moderate pain., Disp: , Rfl:  .  etonogestrel-ethinyl estradiol (NUVARING) 0.12-0.015 MG/24HR vaginal ring, Place 1 each vaginally See admin instructions. Insert vaginally and leave in place for 3 consecutive weeks, then remove for 1 week., Disp: 1 each, Rfl: 12 .  ibuprofen (ADVIL) 200 MG tablet, Take 400 mg by mouth every 6 (six) hours as needed for moderate pain., Disp: , Rfl:  .  lamoTRIgine (LAMICTAL) 100 MG tablet, Take 1 tablet (100 mg total) by mouth 2 (two) times daily., Disp: 180 tablet, Rfl: 1 .  LORazepam (ATIVAN) 0.5 MG tablet, Take 1 tablet (0.5 mg total) by mouth every 8 (eight) hours as  needed for anxiety. Per psychiatrist, Disp: 60 tablet, Rfl: 1 .  fluticasone (FLONASE) 50 MCG/ACT nasal spray, Place into both nostrils daily as needed for allergies or rhinitis. , Disp: , Rfl:  .  ondansetron (ZOFRAN) 4 MG tablet, Take 1 tablet (4 mg total) by mouth every 8 (eight) hours as needed for nausea or vomiting. (Patient not taking: Reported on 08/26/2019), Disp: 30 tablet, Rfl: 1 .  rizatriptan (MAXALT) 10 MG tablet, TAKE 1 TABLET (10 MG TOTAL) BY MOUTH AS NEEDED FOR MIGRAINE. MAY REPEAT IN 2 HOURS IF NEEDED (Patient not taking: Reported on 08/26/2019), Disp: 10 tablet, Rfl: 1  EXAM:  VITALS per  patient if applicable: Wt 145 lb (65.8 kg)   BMI 24.89 kg/m    GENERAL: alert, oriented, appears well and in no acute distress  HEENT: atraumatic, conjunttiva clear, no obvious abnormalities on inspection of external nose and ears  NECK: normal movements of the head and neck  LUNGS: on inspection no signs of respiratory distress, breathing rate appears normal, no obvious gross SOB, gasping or wheezing  CV: no obvious cyanosis  MS: moves all visible extremities without noticeable abnormality  PSYCH/NEURO: pleasant and cooperative, no obvious depression or anxiety, speech and thought processing grossly intact  LABS: none today    Chemistry      Component Value Date/Time   NA 142 10/16/2018 1124   K 4.8 10/16/2018 1124   CL 105 10/16/2018 1124   CO2 30 10/16/2018 1124   BUN 11 10/16/2018 1124   CREATININE 0.74 10/16/2018 1124      Component Value Date/Time   CALCIUM 9.0 10/16/2018 1124   ALKPHOS 45 10/16/2018 1124   AST 14 10/16/2018 1124   ALT 13 10/16/2018 1124   BILITOT 0.4 10/16/2018 1124      ASSESSMENT AND PLAN:  Discussed the following assessment and plan:  Chronic constipation: has been difficult to treat with typical otc regimen. Continue increasing water and daily fiber supplement. Instructions:  Buy a bottle (150 ml) of magnesium citrate over the  counter. Drink this whole bottle over the course of 30-60 minutes. A large bowel evacuation should occur. If no large bowel evacuation occurs then do an over the counter fleets enema.  Starting tomorrow take generic senakot S (often called "senna plus") 2 tabs every day. You can continue to take miralax powder 1 to 3 capfuls 1 to 2 times per day as needed (in addition to taking the senakot every day). If not significantly improved in 5-7d then call back and set up a follow up appt for a visit IN the office.   I discussed the assessment and treatment plan with the patient. The patient was provided an opportunity to ask questions and all were answered. The patient agreed with the plan and demonstrated an understanding of the instructions.   The patient was advised to call back or seek an in-person evaluation if the symptoms worsen or if the condition fails to improve as anticipated.  F/u: 5-7d if not signifcantly improving.  Signed:  Crissie Sickles, MD           08/26/2019

## 2019-08-26 NOTE — Patient Instructions (Addendum)
Buy a bottle (150 ml) of magnesium citrate over the counter. Drink this whole bottle over the course of 30-60 minutes. A large bowel evacuation should occur. If no large bowel evacuation occurs then do an over the counter fleets enema.  Starting tomorrow take generic senakot S (often called "senna plus") 2 tabs every day. You can continue to take miralax powder 1 to 3 capfuls 1 to 2 times per day as needed (in addition to taking the senakot every day). If not significantly improved in 5-7d then call back and set up a follow up appt for a visit IN the office.

## 2019-08-27 ENCOUNTER — Ambulatory Visit: Payer: 59 | Admitting: Family Medicine

## 2019-08-27 ENCOUNTER — Encounter: Payer: Self-pay | Admitting: Family Medicine

## 2019-08-27 NOTE — Telephone Encounter (Signed)
Please advise, thanks.

## 2019-08-30 ENCOUNTER — Encounter: Payer: Self-pay | Admitting: Family Medicine

## 2019-08-30 MED ORDER — LINACLOTIDE 145 MCG PO CAPS: 145.0000 ug | ORAL_CAPSULE | Freq: Every day | ORAL | 1 refills | Status: DC

## 2019-08-30 NOTE — Telephone Encounter (Signed)
Patient had virtual visit on 08/26/19 for chronic constipation, followed PCP recommendations but still no relief. Please advise, thanks.

## 2019-08-30 NOTE — Telephone Encounter (Signed)
Linzess eRx'd. Needs to take it daily for 2 weeks to see if it will work well or not.

## 2019-08-31 NOTE — Telephone Encounter (Signed)
Please advise, thanks.

## 2019-09-13 ENCOUNTER — Other Ambulatory Visit: Payer: Self-pay | Admitting: Obstetrics & Gynecology

## 2019-09-13 MED ORDER — ETONOGESTREL-ETHINYL ESTRADIOL 0.12-0.015 MG/24HR VA RING: 1.0000 | VAGINAL_RING | VAGINAL | 12 refills | Status: DC

## 2019-09-16 ENCOUNTER — Ambulatory Visit (INDEPENDENT_AMBULATORY_CARE_PROVIDER_SITE_OTHER): Payer: 59 | Admitting: Obstetrics & Gynecology

## 2019-09-16 ENCOUNTER — Other Ambulatory Visit (HOSPITAL_COMMUNITY)
Admission: RE | Admit: 2019-09-16 | Discharge: 2019-09-16 | Disposition: A | Discharge status: Home / Self Care | Payer: 59 | Source: Ambulatory Visit | Attending: Obstetrics & Gynecology | Admitting: Obstetrics & Gynecology

## 2019-09-16 ENCOUNTER — Encounter: Payer: Self-pay | Admitting: Obstetrics & Gynecology

## 2019-09-16 ENCOUNTER — Other Ambulatory Visit: Payer: Self-pay

## 2019-09-16 VITALS — BP 111/74 | HR 85 | Ht 65.25 in | Wt 149.5 lb

## 2019-09-16 DIAGNOSIS — Z01419 Encounter for gynecological examination (general) (routine) without abnormal findings: Secondary | ICD-10-CM | POA: Diagnosis present

## 2019-09-16 NOTE — Progress Notes (Signed)
Subjective:     Susan Chandler is a 29 y.o. female here for a routine exam.  Patient's last menstrual period was 07/27/2019. [id number] Birth Control Method:  nuva ring Menstrual Calendar(currently): regular  Current complaints: none.   Current acute medical issues:  none   Recent Gynecologic History Patient's last menstrual period was 07/27/2019. Last Pap: 2020,  normal Last mammogram: ,    Past Medical History:  Diagnosis Date  . Abdominal pain    Persistent Mar/Apr 2020: w/u pretty unrevealing by me ([place]); pt to see GI 10/21/18.  [name] Abnormal uterine bleeding    Dr. Despina Hidden.  . Bipolar disorder [facility])    r/o OCD per psychiatrist 03/2016.  Also, psychologist eval revealed suspected Bipolar II, depressed phase 04/16/2016.  [name] Chronic pain syndrome   . Dysmenorrhea    mgmt per Dr. Despina Hidden (01/2018).  Nuva Ring vag inserts 07/2018.  [name] Erosive gastropathy 10/2018   Nonbleeding (EGD 10/2018)  . GAD (generalized anxiety disorder)   . Infectious mononucleosis 2007  . Lumbosacral radiculopathy at L5 2015   [facility]; then 2nd opinion with Dr. Shelle Iron at [facility], where she got ESI left L5-S1 11/2013 with moderate improvement.  Saw neurosurgeon 01/2016: surgery NOT recommended.  PT initiated.  . Migraine syndrome   . Obesity, Class I, BMI 30-34.9   . Pre-syncope 2016   ? POTS.  Holter showed mostly NSR, occ PACs, one run of nonsustained atrial tachy.  . Salmonella enteritis 09/2017    Past Surgical History:  Procedure Laterality Date  . BIOPSY  10/26/2018   Procedure: BIOPSY;  Surgeon: Malissa Hippo, MD;  [facility];  Service: Endoscopy;;  . ESOPHAGOGASTRODUODENOSCOPY  10/2018   Nonbleeding erosive gastropathy, H pylori NEG.  . ESOPHAGOGASTRODUODENOSCOPY (EGD) WITH PROPOFOL N/A 10/26/2018   Procedure: ESOPHAGOGASTRODUODENOSCOPY (EGD) WITH PROPOFOL;  Surgeon: Malissa Hippo, MD;  [facility];  Service: Endoscopy;  Laterality: N/A;   . WISDOM TOOTH EXTRACTION      OB History    Gravida  0   Para  0   Term  0   Preterm  0   AB  0   Living  0     SAB  0   TAB  0   Ectopic  0   Multiple  0   Live Births  0           Social History   Socioeconomic History  . Marital status: Married    Spouse name: Not on file  . Number of children: Not on file  . Years of education: Not on file  . Highest education level: Not on file  Occupational History  . Not on file  Tobacco Use  . Smoking status: Never Smoker  . Smokeless tobacco: Never Used  Substance and Sexual Activity  . Alcohol use: No    Comment: 04-05-2016 per pt no  . Drug use: No    Comment: 04-05-2016 per pt no   . Sexual activity: Yes    Birth control/protection: Inserts  Other Topics Concern  . Not on file  Social History Narrative   Married, no children.   College: [employer].   Occupation: Used to work at [employer] in [place]   Moved to [place], [place] in 2015.   No T/A/Ds.   No exercise.   Social Determinants of Health   Financial Resource Strain:   . Difficulty of Paying Living Expenses:   Food Insecurity:   .  Worried About Charity fundraiser in the Last Year:   . Arboriculturist in the Last Year:   Transportation Needs:   . Film/video editor (Medical):   [name] Lack of Transportation (Non-Medical):   Physical Activity:   . Days of Exercise per Week:   . Minutes of Exercise per Session:   Stress:   . Feeling of Stress :   Social Connections:   . Frequency of Communication with Friends and Family:   . Frequency of Social Gatherings with Friends and Family:   . Attends Religious Services:   . Active Member of Clubs or Organizations:   . Attends Archivist Meetings:   [name] Marital Status:     Family History  Problem Relation Age of Onset  . Cancer Maternal Aunt   . Alcohol abuse Maternal Grandfather   . Bipolar disorder Father   . Bipolar disorder Sister   . Other Sister        e coli     Current  Outpatient Medications:  .  acetaminophen (TYLENOL) 500 MG tablet, Take 500 mg by mouth every 6 (six) hours as needed for mild pain or moderate pain., Disp: , Rfl:  .  etonogestrel-ethinyl estradiol (NUVARING) 0.12-0.015 MG/24HR vaginal ring, Place 1 each vaginally See admin instructions. Insert vaginally and leave in place for 3 consecutive weeks, then remove for 1 week., Disp: 3 each, Rfl: 12 .  fluticasone (FLONASE) 50 MCG/ACT nasal spray, Place into both nostrils daily as needed for allergies or rhinitis. , Disp: , Rfl:  .  ibuprofen (ADVIL) 200 MG tablet, Take 400 mg by mouth every 6 (six) hours as needed for moderate pain., Disp: , Rfl:  .  lamoTRIgine (LAMICTAL) 100 MG tablet, Take 1 tablet (100 mg total) by mouth 2 (two) times daily., Disp: 180 tablet, Rfl: 1 .  linaclotide (LINZESS) 145 MCG CAPS capsule, Take 1 capsule (145 mcg total) by mouth daily before breakfast., Disp: 30 capsule, Rfl: 1 .  LORazepam (ATIVAN) 0.5 MG tablet, Take 1 tablet (0.5 mg total) by mouth every 8 (eight) hours as needed for anxiety. Per psychiatrist, Disp: 60 tablet, Rfl: 1 .  rizatriptan (MAXALT) 10 MG tablet, TAKE 1 TABLET (10 MG TOTAL) BY MOUTH AS NEEDED FOR MIGRAINE. MAY REPEAT IN 2 HOURS IF NEEDED, Disp: 10 tablet, Rfl: 1  Review of Systems  Review of Systems  Constitutional: Negative for fever, chills, weight loss, malaise/fatigue and diaphoresis.  HENT: Negative for hearing loss, ear pain, nosebleeds, congestion, sore throat, neck pain, tinnitus and ear discharge.   Eyes: Negative for blurred vision, double vision, photophobia, pain, discharge and redness.  Respiratory: Negative for cough, hemoptysis, sputum production, shortness of breath, wheezing and stridor.   Cardiovascular: Negative for chest pain, palpitations, orthopnea, claudication, leg swelling and PND.  Gastrointestinal: negative for abdominal pain. Negative for heartburn, nausea, vomiting, diarrhea, constipation, blood in stool and melena.   Genitourinary: Negative for dysuria, urgency, frequency, hematuria and flank pain.  Musculoskeletal: Negative for myalgias, back pain, joint pain and falls.  Skin: Negative for itching and rash.  Neurological: Negative for dizziness, tingling, tremors, sensory change, speech change, focal weakness, seizures, loss of consciousness, weakness and headaches.  Endo/Heme/Allergies: Negative for environmental allergies and polydipsia. Does not bruise/bleed easily.  Psychiatric/Behavioral: Negative for depression, suicidal ideas, hallucinations, memory loss and substance abuse. The patient is not nervous/anxious and does not have insomnia.        Objective:  Blood pressure 111/74, pulse 85, height  5' 5.25" (1.657 m), weight 149 lb 8 oz (67.8 kg), last menstrual period 07/27/2019.   Physical Exam  Vitals reviewed. Constitutional: She is oriented to person, place, and time. She appears well-developed and well-nourished.  HENT:  Head: Normocephalic and atraumatic.        Right Ear: External ear normal.  Left Ear: External ear normal.  Nose: Nose normal.  Mouth/Throat: Oropharynx is clear and moist.  Eyes: Conjunctivae and EOM are normal. Pupils are equal, round, and reactive to light. Right eye exhibits no discharge. Left eye exhibits no discharge. No scleral icterus.  Neck: Normal range of motion. Neck supple. No tracheal deviation present. No thyromegaly present.  Cardiovascular: Normal rate, regular rhythm, normal heart sounds and intact distal pulses.  Exam reveals no gallop and no friction rub.   No murmur heard. Respiratory: Effort normal and breath sounds normal. No respiratory distress. She has no wheezes. She has no rales. She exhibits no tenderness.  GI: Soft. Bowel sounds are normal. She exhibits no distension and no mass. There is no tenderness. There is no rebound and no guarding.  Genitourinary:  Breasts no masses skin changes or nipple changes bilaterally      Vulva is normal  without lesions Vagina is pink moist without discharge Cervix normal in appearance and pap is done Uterus is normal size shape and contour Adnexa is negative with normal sized ovaries   Musculoskeletal: Normal range of motion. She exhibits no edema and no tenderness.  Neurological: She is alert and oriented to person, place, and time. She has normal reflexes. She displays normal reflexes. No cranial nerve deficit. She exhibits normal muscle tone. Coordination normal.  Skin: Skin is warm and dry. No rash noted. No erythema. No pallor.  Psychiatric: She has a normal mood and affect. Her behavior is normal. Judgment and thought content normal.       Medications Ordered at today's visit: No orders of the defined types were placed in this encounter.   Other orders placed at today's visit: No orders of the defined types were placed in this encounter.     Assessment:    Healthy female exam.   pt is doing pretty well ith Nuva Ring as management of her DUB Plan:    Contraception: NuvaRing vaginal inserts. Follow up in: 1 year.     Return in about 1 year (around 09/15/2020) for yearly, with Dr Despina Hidden.

## 2019-09-20 LAB — CYTOLOGY - PAP
Comment: NEGATIVE
Diagnosis: UNDETERMINED — AB
High risk HPV: NEGATIVE

## 2019-10-06 ENCOUNTER — Encounter: Payer: Self-pay | Admitting: Family Medicine

## 2019-10-22 ENCOUNTER — Other Ambulatory Visit: Payer: Self-pay

## 2019-10-22 ENCOUNTER — Other Ambulatory Visit: Payer: Self-pay | Admitting: Family Medicine

## 2019-10-22 ENCOUNTER — Encounter: Payer: Self-pay | Admitting: Family Medicine

## 2019-10-22 MED ORDER — LAMOTRIGINE 100 MG PO TABS: 100.0000 mg | ORAL_TABLET | Freq: Two times a day (BID) | ORAL | 0 refills | Status: DC

## 2019-10-22 NOTE — Telephone Encounter (Signed)
Patient scheduled mychart video virtual appt with Dr. Milinda Cave on [facility] May 5th

## 2019-10-27 ENCOUNTER — Telehealth (INDEPENDENT_AMBULATORY_CARE_PROVIDER_SITE_OTHER): Payer: 59 | Admitting: Family Medicine

## 2019-10-27 ENCOUNTER — Encounter: Payer: Self-pay | Admitting: Family Medicine

## 2019-10-27 ENCOUNTER — Other Ambulatory Visit: Payer: Self-pay

## 2019-10-27 VITALS — Wt 145.0 lb

## 2019-10-27 DIAGNOSIS — F411 Generalized anxiety disorder: Secondary | ICD-10-CM

## 2019-10-27 DIAGNOSIS — F3181 Bipolar II disorder: Secondary | ICD-10-CM | POA: Diagnosis not present

## 2019-10-27 DIAGNOSIS — K5909 Other constipation: Secondary | ICD-10-CM | POA: Diagnosis not present

## 2019-10-27 MED ORDER — LINACLOTIDE 145 MCG PO CAPS: 145.0000 ug | ORAL_CAPSULE | Freq: Every day | ORAL | 3 refills | Status: DC

## 2019-10-27 MED ORDER — LORAZEPAM 0.5 MG PO TABS: 0.5000 mg | ORAL_TABLET | Freq: Three times a day (TID) | ORAL | 1 refills | Status: DC | PRN

## 2019-10-27 MED ORDER — LAMOTRIGINE 100 MG PO TABS: 100.0000 mg | ORAL_TABLET | Freq: Two times a day (BID) | ORAL | 1 refills | Status: DC

## 2019-10-27 NOTE — Progress Notes (Signed)
Virtual Visit via Video Note  I connected with Susan Chandler on 10/27/19 at  4:00 PM EDT by a video enabled telemedicine application and verified that I am speaking with the correct person using two identifiers.  Location patient: home Location provider:work or home office Persons participating in the virtual visit: patient, provider  I discussed the limitations of evaluation and management by telemedicine and the availability of in person appointments. The patient expressed understanding and agreed to proceed.  Telemedicine visit is a necessity given the COVID-19 restrictions in place at the current time.  HPI: 29 y/o WF being seen today for f/u chronic constipation and also bipolar II w/GAD. I last saw her for acute-on-chronic constipation 08/26/19, at which time I recommended mag citrate followed by daily senakot S and miralax and started her on linzess.  I last saw her for f/u psych 07/2018, at which time she was doing pretty good on lamictal 100 mg bid and prn/infrequent use of ativan.  Interim hx: Mood is stable, she is comfortable with current dose of 100 mg bid. Has some brief ups and downs, nothing prolonged. Has been out of lorazepam for quite some time. This med helped a lot for night-time anxiety. Her sister passed away suddenly 2019/08/05.  She has had a really hard 2021 so far.  Says linzess has worked wonders for her constipation. Feels A LOT better.  Has 1-2 BMs every day.  Feeling fine physically.  No probs with HAs in quite a while, not   PMP AWARE reviewed today: most recent rx for ativan 0.5mg  was filled 10/09/18, # 60, rx by me. No red flags.  ROS: See pertinent positives and negatives per HPI.  Past Medical History:  Diagnosis Date  . Abdominal pain    Persistent Mar/Apr 2020: w/u pretty unrevealing by me ([name]); Susan Chandler to see GI 10/21/18.  [name] Abnormal uterine bleeding    Dr. Despina Hidden. Nuvaring as of 09/2019  . Bipolar disorder [facility])    r/o OCD per psychiatrist 03/2016.  Also,  psychologist eval revealed suspected Bipolar II, depressed phase 04/16/2016.  [name] Chronic pain syndrome   . Dysmenorrhea    mgmt per Dr. Despina Hidden (01/2018).  Nuva Ring vag inserts 07/2018.  [name] Erosive gastropathy 10/2018   Nonbleeding (EGD 10/2018)  . GAD (generalized anxiety disorder)   . Infectious mononucleosis 2007  . Lumbosacral radiculopathy at L5 2015   [facility]. Ret[facility] Passe at [facility]; then 2nd opinion with Dr. Shelle Iron at [facility], where she got ESI left L5-S1 11/2013 with moderate improvement.  Saw neurosurgeon 01/2016: surgery NOT recommended.  Susan Chandler initiated.  . Migraine syndrome   . Obesity, Class I, BMI 30-34.9   . Pre-syncope 2016   ? POTS.  Holter showed mostly NSR, occ PACs, one run of nonsustained atrial tachy.  . Salmonella enteritis 09/2017    Past Surgical History:  Procedure Laterality Date  . BIOPSY  10/26/2018   Procedure: BIOPSY;  Surgeon: Malissa Hippo, MD;  [facility];  Service: Endoscopy;;  . ESOPHAGOGASTRODUODENOSCOPY  10/2018   Nonbleeding erosive gastropathy, H pylori NEG.  . ESOPHAGOGASTRODUODENOSCOPY (EGD) WITH PROPOFOL N/A 10/26/2018   Procedure: ESOPHAGOGASTRODUODENOSCOPY (EGD) WITH PROPOFOL;  Surgeon: Malissa Hippo, MD;  [facility];  Service: Endoscopy;  Laterality: N/A;  . WISDOM TOOTH EXTRACTION      Family History  Problem Relation Age of Onset  . Cancer Maternal Aunt   . Alcohol abuse Maternal Grandfather   . Bipolar disorder Father   .  Bipolar disorder Sister   . Other Sister        e coli    Current Outpatient Medications:  .  etonogestrel-ethinyl estradiol (NUVARING) 0.12-0.015 MG/24HR vaginal ring, Place 1 each vaginally See admin instructions. Insert vaginally and leave in place for 3 consecutive weeks, then remove for 1 week., Disp: 3 each, Rfl: 12 .  ibuprofen (ADVIL) 200 MG tablet, Take 400 mg by mouth every 6 (six) hours as needed for moderate pain., Disp: , Rfl:  .  lamoTRIgine (LAMICTAL) 100 MG  tablet, Take 1 tablet (100 mg total) by mouth 2 (two) times daily., Disp: 60 tablet, Rfl: 0 .  linaclotide (LINZESS) 145 MCG CAPS capsule, Take 1 capsule (145 mcg total) by mouth daily before breakfast., Disp: 30 capsule, Rfl: 1 .  LORazepam (ATIVAN) 0.5 MG tablet, Take 1 tablet (0.5 mg total) by mouth every 8 (eight) hours as needed for anxiety. Per psychiatrist, Disp: 60 tablet, Rfl: 1 .  acetaminophen (TYLENOL) 500 MG tablet, Take 500 mg by mouth every 6 (six) hours as needed for mild pain or moderate pain., Disp: , Rfl:  .  fluticasone (FLONASE) 50 MCG/ACT nasal spray, Place into both nostrils daily as needed for allergies or rhinitis. , Disp: , Rfl:  .  rizatriptan (MAXALT) 10 MG tablet, TAKE 1 TABLET (10 MG TOTAL) BY MOUTH AS NEEDED FOR MIGRAINE. MAY REPEAT IN 2 HOURS IF NEEDED (Patient not taking: Reported on 10/27/2019), Disp: 10 tablet, Rfl: 1  EXAM:  VITALS per patient if applicable: Wt 145 lb (65.8 kg)   BMI 23.94 kg/m    GENERAL: alert, oriented, appears well and in no acute distress  HEENT: atraumatic, conjunttiva clear, no obvious abnormalities on inspection of external nose and ears  NECK: normal movements of the head and neck  LUNGS: on inspection no signs of respiratory distress, breathing rate appears normal, no obvious gross SOB, gasping or wheezing  CV: no obvious cyanosis  MS: moves all visible extremities without noticeable abnormality  PSYCH/NEURO: pleasant and cooperative, no obvious depression or anxiety, speech and thought processing grossly intact  LABS: none today    Chemistry      Component Value Date/Time   NA 142 10/16/2018 1124   K 4.8 10/16/2018 1124   CL 105 10/16/2018 1124   CO2 30 10/16/2018 1124   BUN 11 10/16/2018 1124   CREATININE 0.74 10/16/2018 1124      Component Value Date/Time   CALCIUM 9.0 10/16/2018 1124   ALKPHOS 45 10/16/2018 1124   AST 14 10/16/2018 1124   ALT 13 10/16/2018 1124   BILITOT 0.4 10/16/2018 1124     Lab  Results  Component Value Date   WBC 4.5 10/16/2018   HGB 13.3 10/16/2018   HCT 38.4 10/16/2018   MCV 90.2 10/16/2018   PLT 259.0 10/16/2018   ASSESSMENT AND PLAN:  Discussed the following assessment and plan:  1) Bipolar II, anxiety: stable. She is dealing with death of her sister 07/23/19 pretty well. Will continue lamictal 100mg  bid and get her some more lorazepam 0.5mg  to take 1 tid prn, #60, RF x 1.  2) Chronic constipation; doing great on linzess now. Continue/RF this.   I discussed the assessment and treatment plan with the patient. The patient was provided an opportunity to ask questions and all were answered. The patient agreed with the plan and demonstrated an understanding of the instructions.   The patient was advised to call back or seek an in-person evaluation if the symptoms  worsen or if the condition fails to improve as anticipated.  F/u: 6 mo  Signed:  Santiago Bumpers, MD           10/27/2019

## 2019-11-04 ENCOUNTER — Encounter: Payer: Self-pay | Admitting: Family Medicine

## 2019-11-16 ENCOUNTER — Ambulatory Visit: Payer: 59 | Admitting: Family Medicine

## 2019-11-16 ENCOUNTER — Other Ambulatory Visit: Payer: Self-pay

## 2019-11-16 ENCOUNTER — Encounter: Payer: Self-pay | Admitting: Family Medicine

## 2019-11-16 VITALS — BP 100/70 | HR 76 | Temp 98.6°F | Resp 16 | Ht 65.25 in | Wt 151.6 lb

## 2019-11-16 DIAGNOSIS — M6740 Ganglion, unspecified site: Secondary | ICD-10-CM

## 2019-11-16 NOTE — Progress Notes (Signed)
OFFICE VISIT  11/16/2019   CC:  Chief Complaint  Patient presents with  . Ganglion cyst    located on right hand, has been to chiropractor since then. First noticed 1 month ago   HPI:    Patient is a 29 y.o. Caucasian female who presents for "ganglion cyst". Noted focal swelling on dorsal aspect of R wrist about a month ago. Hurt when she flexed/extended wrist. Three days ago it completely disappeared and now has some swelling more towards radial styloid area and no longer causes any pain.    Past Medical History:  Diagnosis Date  . Abdominal pain    Persistent Mar/Apr 2020: w/u pretty unrevealing by me (Dalphine Cowie); pt to see GI 10/21/18.  [name] Abnormal uterine bleeding    Dr. Elonda Husky. Nuvaring as of 09/2019  . Bipolar disorder [facility])    r/o OCD per psychiatrist 03/2016.  Also, psychologist eval revealed suspected Bipolar II, depressed phase 04/16/2016.  [name] Chronic pain syndrome   . Dysmenorrhea    mgmt per Dr. Elonda Husky (01/2018).  Nuva Ring vag inserts 07/2018.  [name] Erosive gastropathy 10/2018   Nonbleeding (EGD 10/2018)  . GAD (generalized anxiety disorder)   . Infectious mononucleosis 2007  . Lumbosacral radiculopathy at L5 2015   [facility]ia [facility]; then 2nd opinion with Dr. Tonita Cong at [facility], where she got ESI left L5-S1 11/2013 with moderate improvement.  Saw neurosurgeon 01/2016: surgery NOT recommended.  PT initiated.  . Migraine syndrome   . Obesity, Class I, BMI 30-34.9   . Pre-syncope 2016   ? POTS.  Holter showed mostly NSR, occ PACs, one run of nonsustained atrial tachy.  . Salmonella enteritis 09/2017    Past Surgical History:  Procedure Laterality Date  . BIOPSY  10/26/2018   Procedure: BIOPSY;  Surgeon: Rogene Houston, MD;  [facility];  Service: Endoscopy;;  . ESOPHAGOGASTRODUODENOSCOPY  10/2018   Nonbleeding erosive gastropathy, H pylori NEG.  . ESOPHAGOGASTRODUODENOSCOPY (EGD) WITH PROPOFOL N/A 10/26/2018   Procedure:  ESOPHAGOGASTRODUODENOSCOPY (EGD) WITH PROPOFOL;  Surgeon: Rogene Houston, MD;  [facility];  Service: Endoscopy;  Laterality: N/A;  . WISDOM TOOTH EXTRACTION      Outpatient Medications Prior to Visit  Medication Sig Dispense Refill  . etonogestrel-ethinyl estradiol (NUVARING) 0.12-0.015 MG/24HR vaginal ring Place 1 each vaginally See admin instructions. Insert vaginally and leave in place for 3 consecutive weeks, then remove for 1 week. 3 each 12  . lamoTRIgine (LAMICTAL) 100 MG tablet Take 1 tablet (100 mg total) by mouth 2 (two) times daily. 180 tablet 1  . linaclotide (LINZESS) 145 MCG CAPS capsule Take 1 capsule (145 mcg total) by mouth daily before breakfast. 90 capsule 3  . acetaminophen (TYLENOL) 500 MG tablet Take 500 mg by mouth every 6 (six) hours as needed for mild pain or moderate pain.    . fluticasone (FLONASE) 50 MCG/ACT nasal spray Place into both nostrils daily as needed for allergies or rhinitis.     [name] ibuprofen (ADVIL) 200 MG tablet Take 400 mg by mouth every 6 (six) hours as needed for moderate pain.    [name] LORazepam (ATIVAN) 0.5 MG tablet Take 1 tablet (0.5 mg total) by mouth every 8 (eight) hours as needed for anxiety. (Patient not taking: Reported on 11/16/2019) 60 tablet 1  . rizatriptan (MAXALT) 10 MG tablet TAKE 1 TABLET (10 MG TOTAL) BY MOUTH AS NEEDED FOR MIGRAINE. MAY REPEAT IN 2 HOURS IF NEEDED (Patient not taking:  Reported on 10/27/2019) 10 tablet 1   No facility-administered medications prior to visit.    Allergies  Allergen Reactions  . Fish-Derived Products Nausea And Vomiting    Shell fish make her sick  . Shellfish Allergy Nausea And Vomiting    ROS As per HPI  PE: Blood pressure 100/70, pulse 76, temperature 98.6 F (37 C), temperature source Temporal, resp. rate 16, height 5' 5.25" (1.657 m), weight 151 lb 9.6 oz (68.8 kg), last menstrual period 11/12/2019, SpO2 99 %.  Gen: Alert, well appearing.  Patient is oriented to person, place,  time, and situation. AFFECT: pleasant, lucid thought and speech. R wrist with some soft, diffuse swelling over radial styloid, diffuse and not localized or firm. No erythema or tenderness.  Wrist ROM fully intact w/out pain. No firm nodule visible or palpable anywhere on wrist or hand. All finger/hand joints with full ROM w/out pain and w/out any tenderness.  LABS:    Chemistry      Component Value Date/Time   NA 142 10/16/2018 1124   K 4.8 10/16/2018 1124   CL 105 10/16/2018 1124   CO2 30 10/16/2018 1124   BUN 11 10/16/2018 1124   CREATININE 0.74 10/16/2018 1124      Component Value Date/Time   CALCIUM 9.0 10/16/2018 1124   ALKPHOS 45 10/16/2018 1124   AST 14 10/16/2018 1124   ALT 13 10/16/2018 1124   BILITOT 0.4 10/16/2018 1124      IMPRESSION AND PLAN:  Ruptured ganglion cyst, right wrist dorsal aspect. Reassured pt, expect gradual resolution of swelling from disperse cyst contents. Signs/symptoms to call or return for were reviewed and pt expressed understanding.  An After Visit Summary was printed and given to the patient.  FOLLOW UP: Return if symptoms worsen or fail to improve.  Signed:  Santiago Bumpers, MD           11/16/2019

## 2020-01-31 ENCOUNTER — Encounter: Payer: Self-pay | Admitting: Family Medicine

## 2020-02-04 ENCOUNTER — Ambulatory Visit: Payer: 59 | Admitting: Family Medicine

## 2020-02-04 ENCOUNTER — Other Ambulatory Visit: Payer: Self-pay

## 2020-02-04 MED ORDER — PERMETHRIN 5 % EX CREA: TOPICAL_CREAM | CUTANEOUS | 1 refills | Status: DC

## 2020-02-23 ENCOUNTER — Ambulatory Visit: Payer: 59 | Admitting: Family Medicine

## 2020-03-06 ENCOUNTER — Encounter: Payer: Self-pay | Admitting: Family Medicine

## 2020-03-06 ENCOUNTER — Telehealth (INDEPENDENT_AMBULATORY_CARE_PROVIDER_SITE_OTHER): Payer: 59 | Admitting: Family Medicine

## 2020-03-06 ENCOUNTER — Other Ambulatory Visit: Payer: Self-pay

## 2020-03-06 VITALS — Wt 150.0 lb

## 2020-03-06 DIAGNOSIS — S50862A Insect bite (nonvenomous) of left forearm, initial encounter: Secondary | ICD-10-CM | POA: Diagnosis not present

## 2020-03-06 DIAGNOSIS — W57XXXA Bitten or stung by nonvenomous insect and other nonvenomous arthropods, initial encounter: Secondary | ICD-10-CM | POA: Diagnosis not present

## 2020-03-06 DIAGNOSIS — L03114 Cellulitis of left upper limb: Secondary | ICD-10-CM

## 2020-03-06 MED ORDER — FLUTICASONE PROPIONATE 0.05 % EX CREA: TOPICAL_CREAM | Freq: Two times a day (BID) | CUTANEOUS | 0 refills | Status: DC

## 2020-03-06 MED ORDER — CEPHALEXIN 500 MG PO CAPS: 500.0000 mg | ORAL_CAPSULE | Freq: Two times a day (BID) | ORAL | 0 refills | Status: AC

## 2020-03-06 NOTE — Progress Notes (Signed)
Virtual Visit via Video Note  I connected with pt on 03/06/20 at  4:00 PM EDT by a video enabled telemedicine application and verified that I am speaking with the correct person using two identifiers.  Location patient: home Location provider:work or home office Persons participating in the virtual visit: patient, provider  I discussed the limitations of evaluation and management by telemedicine and the availability of in person appointments. The patient expressed understanding and agreed to proceed.   HPI: 29 y/o WF being seen today for "bug bite" left arm. Five d/a was sitting at a picnic table in a wooded area, got home and noted a "whelp" on arm in crook of elbow that itched.  Did not feel a bite or see any insect bite her. Next day same.  Next day still there but some redness around it.  Still itchy at times. Hasn't gotten worse or better in last 2 d.  No f/c/malaise. Applying hydrocortisone some. Took antihistamine pill yest and today.   ROS: See pertinent positives and negatives per HPI.  Past Medical History:  Diagnosis Date  . Abdominal pain    Persistent Mar/Apr 2020: w/u pretty unrevealing by me (Tramaine Sauls); pt to see GI 10/21/18.  [name] Abnormal uterine bleeding    Dr. Despina Hidden. Nuvaring as of 09/2019  . Bipolar disorder [facility])    r/o OCD per psychiatrist 03/2016.  Also, psychologist eval revealed suspected Bipolar II, depressed phase 04/16/2016.  [name] Chronic pain syndrome   . Dysmenorrhea    mgmt per Dr. Despina Hidden (01/2018).  Nuva Ring vag inserts 07/2018.  [name] Erosive gastropathy 10/2018   Nonbleeding (EGD 10/2018)  . GAD (generalized anxiety disorder)   . Infectious mononucleosis 2007  . Lumbosacral radiculopathy at L5 2015   [facility]; then 2nd opinion with Dr. Shelle Iron at [facility] ortho, where she got ESI left L5-S1 11/2013 with moderate improvement.  Saw neurosurgeon 01/2016: surgery NOT recommended.  PT initiated.  . Migraine syndrome   . Obesity, Class I,  BMI 30-34.9   . Pre-syncope 2016   ? POTS.  Holter showed mostly NSR, occ PACs, one run of nonsustained atrial tachy.  . Salmonella enteritis 09/2017    Past Surgical History:  Procedure Laterality Date  . BIOPSY  10/26/2018   Procedure: BIOPSY;  Surgeon: Malissa Hippo, MD;  [facility];  Service: Endoscopy;;  . ESOPHAGOGASTRODUODENOSCOPY  10/2018   Nonbleeding erosive gastropathy, H pylori NEG.  . ESOPHAGOGASTRODUODENOSCOPY (EGD) WITH PROPOFOL N/A 10/26/2018   Procedure: ESOPHAGOGASTRODUODENOSCOPY (EGD) WITH PROPOFOL;  Surgeon: Malissa Hippo, MD;  [facility];  Service: Endoscopy;  Laterality: N/A;  . WISDOM TOOTH EXTRACTION      Family History  Problem Relation Age of Onset  . Cancer Maternal Aunt   . Alcohol abuse Maternal Grandfather   . Bipolar disorder Father   . Bipolar disorder Sister   . Other Sister        e coli     Current Outpatient Medications:  .  acetaminophen (TYLENOL) 500 MG tablet, Take 500 mg by mouth every 6 (six) hours as needed for mild pain or moderate pain., Disp: , Rfl:  .  ibuprofen (ADVIL) 200 MG tablet, Take 400 mg by mouth every 6 (six) hours as needed for moderate pain., Disp: , Rfl:  .  lamoTRIgine (LAMICTAL) 100 MG tablet, Take 1 tablet (100 mg total) by mouth 2 (two) times daily., Disp: 180 tablet, Rfl: 1 .  linaclotide (LINZESS) 145  MCG CAPS capsule, Take 1 capsule (145 mcg total) by mouth daily before breakfast., Disp: 90 capsule, Rfl: 3 .  LORazepam (ATIVAN) 0.5 MG tablet, Take 1 tablet (0.5 mg total) by mouth every 8 (eight) hours as needed for anxiety., Disp: 60 tablet, Rfl: 1 .  fluticasone (FLONASE) 50 MCG/ACT nasal spray, Place into both nostrils daily as needed for allergies or rhinitis.  (Patient not taking: Reported on 03/06/2020), Disp: , Rfl:  .  permethrin (ELIMITE) 5 % cream, Apply from head to bottoms of feet.  Rinse off after 8-14 hours.  May repeat in 1 wk (Patient not taking: Reported on 03/06/2020), Disp:  60 g, Rfl: 1 .  rizatriptan (MAXALT) 10 MG tablet, TAKE 1 TABLET (10 MG TOTAL) BY MOUTH AS NEEDED FOR MIGRAINE. MAY REPEAT IN 2 HOURS IF NEEDED (Patient not taking: Reported on 03/06/2020), Disp: 10 tablet, Rfl: 1  EXAM:  VITALS per patient if applicable: Wt 150 lb (68 kg)   BMI 24.77 kg/m    GENERAL: alert, oriented, appears well and in no acute distress Video quality does limit accuracy of visual analysis of skin rash. SKIN: antecubital fossa of L arm has an approx 2-3 mm hive and surrounding this area is a tennis ball sized region of very mild erythema with question of distinct borders.   HEENT: atraumatic, conjunttiva clear, no obvious abnormalities on inspection of external nose and ears  NECK: normal movements of the head and neck  LUNGS: on inspection no signs of respiratory distress, breathing rate appears normal, no obvious gross SOB, gasping or wheezing  CV: no obvious cyanosis  MS: moves all visible extremities without noticeable abnormality  PSYCH/NEURO: pleasant and cooperative, no obvious depression or anxiety, speech and thought processing grossly intact  LABS: none today    Chemistry      Component Value Date/Time   NA 142 10/16/2018 1124   K 4.8 10/16/2018 1124   CL 105 10/16/2018 1124   CO2 30 10/16/2018 1124   BUN 11 10/16/2018 1124   CREATININE 0.74 10/16/2018 1124      Component Value Date/Time   CALCIUM 9.0 10/16/2018 1124   ALKPHOS 45 10/16/2018 1124   AST 14 10/16/2018 1124   ALT 13 10/16/2018 1124   BILITOT 0.4 10/16/2018 1124     Lab Results  Component Value Date   WBC 4.5 10/16/2018   HGB 13.3 10/16/2018   HCT 38.4 10/16/2018   MCV 90.2 10/16/2018   PLT 259.0 10/16/2018    ASSESSMENT AND PLAN:  Discussed the following assessment and plan:  Insect bite, question of cellulitis surrounding this. Cutivate 0.05% cream bid. Keflex 500mg  bid x 5d. Nonsedating antihistamine daily until rash is gone. Signs/symptoms to call or return for  were reviewed and pt expressed understanding.  -we discussed possible serious and likely etiologies, options for evaluation and workup, limitations of telemedicine visit vs in person visit, treatment, treatment risks and precautions. Pt prefers to treat via telemedicine empirically rather then risking or undertaking an in person visit at this moment.  Work/School slipped offered: Advised to seek prompt follow up telemedicine visit or in person care if worsening, new symptoms arise, or if is not improving with treatment. Did let her know that I only do telemedicine on Tuesdays and Thursdays for [facility] and advised follow up visit with PCP or [facility] if needs follow up or if any further questions arise to avoid any delays.   I discussed the assessment and treatment plan with the patient. The patient  was provided an opportunity to ask questions and all were answered. The patient agreed with the plan and demonstrated an understanding of the instructions.   The patient was advised to call back or seek an in-person evaluation if the symptoms worsen or if the condition fails to improve as anticipated.  F/u: if not improving in next 2-3 days or if worsening and getting f/c/malaise  Signed:  Santiago Bumpers, MD           03/06/2020

## 2020-03-10 ENCOUNTER — Ambulatory Visit: Payer: 59 | Admitting: Family Medicine

## 2020-03-13 ENCOUNTER — Encounter: Payer: Self-pay | Admitting: Family Medicine

## 2020-03-13 NOTE — Telephone Encounter (Signed)
Please advise, thanks.

## 2020-03-14 MED ORDER — FLUCONAZOLE 150 MG PO TABS: 150.0000 mg | ORAL_TABLET | Freq: Once | ORAL | 0 refills | Status: AC

## 2020-03-14 NOTE — Telephone Encounter (Signed)
Diflucan eRx'd 

## 2020-04-25 ENCOUNTER — Other Ambulatory Visit: Payer: Self-pay

## 2020-04-25 ENCOUNTER — Encounter: Payer: Self-pay | Admitting: Family Medicine

## 2020-04-25 ENCOUNTER — Ambulatory Visit: Payer: 59 | Admitting: Family Medicine

## 2020-04-25 VITALS — BP 98/64 | HR 72 | Temp 97.9°F | Resp 16 | Ht 65.25 in | Wt 155.0 lb

## 2020-04-25 DIAGNOSIS — F411 Generalized anxiety disorder: Secondary | ICD-10-CM

## 2020-04-25 DIAGNOSIS — R0789 Other chest pain: Secondary | ICD-10-CM

## 2020-04-25 DIAGNOSIS — F3170 Bipolar disorder, currently in remission, most recent episode unspecified: Secondary | ICD-10-CM

## 2020-04-25 MED ORDER — LORAZEPAM 0.5 MG PO TABS: 0.5000 mg | ORAL_TABLET | Freq: Three times a day (TID) | ORAL | 1 refills | Status: DC | PRN

## 2020-04-25 NOTE — Patient Instructions (Signed)
Wean off lamotrigine-->Take your 100mg  lamotrigine tab once a day for 2 weeks, then take 1/2 tab once a day for 2 wks, then 1/2 tab every other day for 7 doses, then stop.

## 2020-04-25 NOTE — Progress Notes (Signed)
OFFICE VISIT  04/25/2020  CC:  Chief Complaint  Patient presents with  . Ear stopped up    right, started today  . Chest hurting    3-4 days ago    HPI:    Patient is a 29 y.o. Caucasian female who presents for ear stopped up and chest hurting.  Chest started "hurting" 3-4 nights ago, center of chest a bit heavy, hard to describe.  Not sharp.  No worse with deep breath.  No preceding strain or other incident.  Somewhat waxing and waning.  No sob, nausea, diaphoresis, fever, or palpitations.  No rad to L jaw or arm.  NO cough or wheezing.  No meds tried. Gets anxious discussing it, starts to cry, says she is worried about breast ca and also is anxious b/c she thinks about her sister all the time (died last year).  Right ear feeling stopped up today. Hearing in R ear a bit "aggravated" comp to L. No ear pain.  No nasal cong/allergies/URI sx's.  Trying to have a baby.   Took nuvaring out in May this year. She does not smoke. Overall from psych standpoint her bipolar d/o has been stable on lamictal. She has always over-worried, particularly about physical symptoms being the worst case scenario.  She understands that these thoughts are not realistic.  Lorazepam prn somewhat helpful.  Not taking regularly at all but twice yesterday since highly anxious.  PMP AWARE reviewed today: most recent rx for lorazepam was filled 10/27/19, # 60, rx by me. No red flags.  Past Medical History:  Diagnosis Date  . Abdominal pain    Persistent Mar/Apr 2020: w/u pretty unrevealing by me (Masyn Fullam); pt to see GI 10/21/18.  [name] Abnormal uterine bleeding    Dr. Despina Hidden. Nuvaring as of 09/2019  . Bipolar disorder [facility])    r/o OCD per psychiatrist 03/2016.  Also, psychologist eval revealed suspected Bipolar II, depressed phase 04/16/2016.  [name] Chronic pain syndrome   . Dysmenorrhea    mgmt per Dr. Despina Hidden (01/2018).  Nuva Ring vag inserts 07/2018.  [name] Erosive gastropathy 10/2018   Nonbleeding (EGD 10/2018)  . GAD  (generalized anxiety disorder)   . Infectious mononucleosis 2007  . Lumbosacral radiculopathy at L5 2015   [facility]; then 2nd opinion with Dr. Shelle Iron at [facility] ortho, where she got ESI left L5-S1 11/2013 with moderate improvement.  Saw neurosurgeon 01/2016: surgery NOT recommended.  PT initiated.  . Migraine syndrome   . Obesity, Class I, BMI 30-34.9   . Pre-syncope 2016   ? POTS.  Holter showed mostly NSR, occ PACs, one run of nonsustained atrial tachy.  . Salmonella enteritis 09/2017    Past Surgical History:  Procedure Laterality Date  . BIOPSY  10/26/2018   Procedure: BIOPSY;  Surgeon: Malissa Hippo, MD;  [facility];  Service: Endoscopy;;  . ESOPHAGOGASTRODUODENOSCOPY  10/2018   Nonbleeding erosive gastropathy, H pylori NEG.  . ESOPHAGOGASTRODUODENOSCOPY (EGD) WITH PROPOFOL N/A 10/26/2018   Procedure: ESOPHAGOGASTRODUODENOSCOPY (EGD) WITH PROPOFOL;  Surgeon: Malissa Hippo, MD;  [facility];  Service: Endoscopy;  Laterality: N/A;  . WISDOM TOOTH EXTRACTION      Outpatient Medications Prior to Visit  Medication Sig Dispense Refill  . acetaminophen (TYLENOL) 500 MG tablet Take 500 mg by mouth every 6 (six) hours as needed for mild pain or moderate pain.    . fluticasone (CUTIVATE) 0.05 % cream Apply topically 2 (two) times daily. 30 g  0  . ibuprofen (ADVIL) 200 MG tablet Take 400 mg by mouth every 6 (six) hours as needed for moderate pain.    [name] lamoTRIgine (LAMICTAL) 100 MG tablet Take 1 tablet (100 mg total) by mouth 2 (two) times daily. 180 tablet 1  . linaclotide (LINZESS) 145 MCG CAPS capsule Take 1 capsule (145 mcg total) by mouth daily before breakfast. 90 capsule 3  . permethrin (ELIMITE) 5 % cream Apply from head to bottoms of feet.  Rinse off after 8-14 hours.  May repeat in 1 wk 60 g 1  . rizatriptan (MAXALT) 10 MG tablet TAKE 1 TABLET (10 MG TOTAL) BY MOUTH AS NEEDED FOR MIGRAINE. MAY REPEAT IN 2 HOURS IF NEEDED 10  tablet 1  . LORazepam (ATIVAN) 0.5 MG tablet Take 1 tablet (0.5 mg total) by mouth every 8 (eight) hours as needed for anxiety. 60 tablet 1  . fluticasone (FLONASE) 50 MCG/ACT nasal spray Place into both nostrils daily as needed for allergies or rhinitis.  (Patient not taking: Reported on 03/06/2020)     No facility-administered medications prior to visit.    Allergies  Allergen Reactions  . Fish-Derived Products Nausea And Vomiting    Shell fish make her sick  . Shellfish Allergy Nausea And Vomiting    ROS As per HPI  PE: Vitals with BMI 04/25/2020 03/06/2020 11/16/2019  Height 5' 5.25" - 5' 5.25"  Weight 155 lbs 150 lbs 151 lbs 10 oz  BMI 25.61 - 25.05  Systolic 98 - 100  Diastolic 64 - 70  Pulse 72 - 76  Some encounter information is confidential and restricted. Go to Review Flowsheets activity to see all data.   Exam chaperoned by Emi Holes, CMA.  Gen: Alert, well appearing.  Patient is oriented to person, place, time, and situation. AFFECT: pleasant, lucid thought and speech. She cries sometimes during encounter today. EARS: normal EACs and TMs bilat. Chest wall w/out defect or tenderness.   CV: RRR, no m/r/g.   LUNGS: CTA bilat, nonlabored resps, good aeration in all lung fields. EXT: no clubbing or cyanosis.  no edema.    LABS:    Chemistry      Component Value Date/Time   NA 142 10/16/2018 1124   K 4.8 10/16/2018 1124   CL 105 10/16/2018 1124   CO2 30 10/16/2018 1124   BUN 11 10/16/2018 1124   CREATININE 0.74 10/16/2018 1124      Component Value Date/Time   CALCIUM 9.0 10/16/2018 1124   ALKPHOS 45 10/16/2018 1124   AST 14 10/16/2018 1124   ALT 13 10/16/2018 1124   BILITOT 0.4 10/16/2018 1124     Lab Results  Component Value Date   WBC 4.5 10/16/2018   HGB 13.3 10/16/2018   HCT 38.4 10/16/2018   MCV 90.2 10/16/2018   PLT 259.0 10/16/2018   Lab Results  Component Value Date   TSH 2.14 10/20/2014   IMPRESSION AND PLAN:  1) Atypical chest  pain, highly suspect anxiety as the cause. No red flags for cardiopulm cause, GERD/esoph unlikely.  2) R ear fullness: normal exam, reassured.  3) Bipolar d/o with GAD and some somatization. Lamictal has been helpful as her mood stabilizer. Ativan somewhat helpful as prn anxiolytic. She is trying to get pregnant so we'll ween her off lamictal. Instructions: Take your 100mg  lamotrigine tab once a day for 2 weeks, then take 1/2 tab once a day for 2 wks, then 1/2 tab every other day for 7 doses, then stop.  Take ativan prn, up to bid ok but try not to get to the point where we would need to ween her off if/when she gets pregnant. She expressed understanding of and agreement with plan today. She understands that in some cases it is best to continue with med to treat for psychological illness b/c risk of uncontrolled bipolar d/o and /or anxiety could be greater than risk of medication. If her bipolar d/o flares coming off lamictal then we'll likely start atypical antipsychotic (risperdal) in this scenario rather than restart lamictal.  Spent 35 min with pt today reviewing HPI, reviewing relevant past history, doing exam, reviewing and discussing lab and imaging data, and formulating plans.  An After Visit Summary was printed and given to the patient.  FOLLOW UP: Return for already set for later this month.--she wants to discuss the latest issues with her neck and low back--has been seeing chiroprator.  Signed:  Santiago Bumpers, MD           04/25/2020

## 2020-05-05 ENCOUNTER — Ambulatory Visit: Payer: 59 | Admitting: Family Medicine

## 2020-06-05 ENCOUNTER — Other Ambulatory Visit: Payer: Self-pay | Admitting: Family Medicine

## 2020-09-18 ENCOUNTER — Encounter: Payer: Self-pay | Admitting: Obstetrics & Gynecology

## 2020-09-18 ENCOUNTER — Ambulatory Visit: Payer: 59 | Admitting: Obstetrics & Gynecology

## 2020-09-18 ENCOUNTER — Other Ambulatory Visit (HOSPITAL_COMMUNITY)
Admission: RE | Admit: 2020-09-18 | Discharge: 2020-09-18 | Disposition: A | Discharge status: Home / Self Care | Payer: 59 | Source: Ambulatory Visit | Attending: Obstetrics & Gynecology | Admitting: Obstetrics & Gynecology

## 2020-09-18 ENCOUNTER — Other Ambulatory Visit: Payer: Self-pay

## 2020-09-18 VITALS — BP 113/76 | HR 96 | Ht 65.0 in | Wt 155.0 lb

## 2020-09-18 DIAGNOSIS — Z01419 Encounter for gynecological examination (general) (routine) without abnormal findings: Secondary | ICD-10-CM

## 2020-09-18 NOTE — Progress Notes (Signed)
Subjective:     Susan Chandler is a 30 y.o. female here for a routine exam.  Patient's last menstrual period was 09/06/2020. [id number] Birth Control Method:  None, trying to get pregnant Menstrual Calendar(currently): regular  Current complaints: had peri menstrual itching 1 cycle.   Current acute medical issues:  none   Recent Gynecologic History Patient's last menstrual period was 09/06/2020. Last Pap: 53299,  ASCUS negative HPV Last mammogram: ,    Past Medical History:  Diagnosis Date  . Abdominal pain    Persistent Mar/Apr 2020: w/u pretty unrevealing by me ([place]); pt to see GI 10/21/18.  [name] Abnormal uterine bleeding    Dr. Despina Hidden. Nuvaring as of 09/2019  . Bipolar disorder [facility])    r/o OCD per psychiatrist 03/2016.  Also, psychologist eval revealed suspected Bipolar II, depressed phase 04/16/2016.  [name] Chronic pain syndrome   . Dysmenorrhea    mgmt per Dr. Despina Hidden (01/2018).  Nuva Ring vag inserts 07/2018.  [name] Erosive gastropathy 10/2018   Nonbleeding (EGD 10/2018)  . GAD (generalized anxiety disorder)   . Infectious mononucleosis 2007  . Lumbosacral radiculopathy at L5 2015   [facility]; then 2nd opinion with Dr. Shelle Iron at [facility]o, where she got ESI left L5-S1 11/2013 with moderate improvement.  Saw neurosurgeon 01/2016: surgery NOT recommended.  PT initiated.  . Migraine syndrome   . Obesity, Class I, BMI 30-34.9   . Pre-syncope 2016   ? POTS.  Holter showed mostly NSR, occ PACs, one run of nonsustained atrial tachy.  . Salmonella enteritis 09/2017    Past Surgical History:  Procedure Laterality Date  . BIOPSY  10/26/2018   Procedure: BIOPSY;  Surgeon: Malissa Hippo, MD;  [facility];  Service: Endoscopy;;  . ESOPHAGOGASTRODUODENOSCOPY  10/2018   Nonbleeding erosive gastropathy, H pylori NEG.  . ESOPHAGOGASTRODUODENOSCOPY (EGD) WITH PROPOFOL N/A 10/26/2018   Procedure: ESOPHAGOGASTRODUODENOSCOPY (EGD) WITH PROPOFOL;  Surgeon:  Malissa Hippo, MD;  [facility];  Service: Endoscopy;  Laterality: N/A;  . WISDOM TOOTH EXTRACTION      OB History    Gravida  0   Para  0   Term  0   Preterm  0   AB  0   Living  0     SAB  0   IAB  0   Ectopic  0   Multiple  0   Live Births  0           Social History   Socioeconomic History  . Marital status: Married    Spouse name: Not on file  . Number of children: Not on file  . Years of education: Not on file  . Highest education level: Not on file  Occupational History  . Not on file  Tobacco Use  . Smoking status: Never Smoker  . Smokeless tobacco: Never Used  Vaping Use  . Vaping Use: Never used  Substance and Sexual Activity  . Alcohol use: No    Comment: 04-05-2016 per pt no  . Drug use: No    Comment: 04-05-2016 per pt no   . Sexual activity: Yes    Birth control/protection: Inserts  Other Topics Concern  . Not on file  Social History Narrative   Married, no children.   College: [employer].   Occupation: Used to work at [employer] in [employer]   Moved to [place], [place] in 2015.   No T/A/Ds.   No exercise.  Social Determinants of Health   Financial Resource Strain: Low Risk   . Difficulty of Paying Living Expenses: Not very hard  Food Insecurity: No Food Insecurity  . Worried About Programme researcher, broadcasting/film/video in the Last Year: Never true  . Ran Out of Food in the Last Year: Never true  Transportation Needs: No Transportation Needs  . Lack of Transportation (Medical): No  . Lack of Transportation (Non-Medical): No  Physical Activity: Sufficiently Active  . Days of Exercise per Week: 6 days  . Minutes of Exercise per Session: 60 min  Stress: Stress Concern Present  . Feeling of Stress : To some extent  Social Connections: Moderately Isolated  . Frequency of Communication with Friends and Family: Three times a week  . Frequency of Social Gatherings with Friends and Family: More than three times a week  . Attends Religious  Services: Never  . Active Member of Clubs or Organizations: No  . Attends Banker Meetings: Never  . Marital Status: Married    Family History  Problem Relation Age of Onset  . Cancer Maternal Aunt   . Alcohol abuse Maternal Grandfather   . Bipolar disorder Father   . Bipolar disorder Sister   . Other Sister        e coli     Current Outpatient Medications:  .  acetaminophen (TYLENOL) 500 MG tablet, Take 500 mg by mouth every 6 (six) hours as needed for mild pain or moderate pain., Disp: , Rfl:  .  linaclotide (LINZESS) 145 MCG CAPS capsule, Take 1 capsule (145 mcg total) by mouth daily before breakfast., Disp: 90 capsule, Rfl: 3 .  fluticasone (FLONASE) 50 MCG/ACT nasal spray, Place into both nostrils daily as needed for allergies or rhinitis.  (Patient not taking: No sig reported), Disp: , Rfl:  .  ibuprofen (ADVIL) 200 MG tablet, Take 400 mg by mouth every 6 (six) hours as needed for moderate pain. (Patient not taking: Reported on 09/18/2020), Disp: , Rfl:  .  LORazepam (ATIVAN) 0.5 MG tablet, Take 1 tablet (0.5 mg total) by mouth every 8 (eight) hours as needed for anxiety. (Patient not taking: Reported on 09/18/2020), Disp: 60 tablet, Rfl: 1 .  rizatriptan (MAXALT) 10 MG tablet, TAKE 1 TABLET (10 MG TOTAL) BY MOUTH AS NEEDED FOR MIGRAINE. MAY REPEAT IN 2 HOURS IF NEEDED (Patient not taking: Reported on 09/18/2020), Disp: 10 tablet, Rfl: 1  Review of Systems  Review of Systems  Constitutional: Negative for fever, chills, weight loss, malaise/fatigue and diaphoresis.  HENT: Negative for hearing loss, ear pain, nosebleeds, congestion, sore throat, neck pain, tinnitus and ear discharge.   Eyes: Negative for blurred vision, double vision, photophobia, pain, discharge and redness.  Respiratory: Negative for cough, hemoptysis, sputum production, shortness of breath, wheezing and stridor.   Cardiovascular: Negative for chest pain, palpitations, orthopnea, claudication, leg  swelling and PND.  Gastrointestinal: negative for abdominal pain. Negative for heartburn, nausea, vomiting, diarrhea, constipation, blood in stool and melena.  Genitourinary: Negative for dysuria, urgency, frequency, hematuria and flank pain.  Musculoskeletal: Negative for myalgias, back pain, joint pain and falls.  Skin: Negative for itching and rash.  Neurological: Negative for dizziness, tingling, tremors, sensory change, speech change, focal weakness, seizures, loss of consciousness, weakness and headaches.  Endo/Heme/Allergies: Negative for environmental allergies and polydipsia. Does not bruise/bleed easily.  Psychiatric/Behavioral: Negative for depression, suicidal ideas, hallucinations, memory loss and substance abuse. The patient is not nervous/anxious and does not have insomnia.  Objective:  Blood pressure 113/76, pulse 96, height 5\' 5"  (1.651 m), weight 155 lb (70.3 kg), last menstrual period 09/06/2020.   Physical Exam  Vitals reviewed. Constitutional: She is oriented to person, place, and time. She appears well-developed and well-nourished.  HENT:  Head: Normocephalic and atraumatic.        Right Ear: External ear normal.  Left Ear: External ear normal.  Nose: Nose normal.  Mouth/Throat: Oropharynx is clear and moist.  Eyes: Conjunctivae and EOM are normal. Pupils are equal, round, and reactive to light. Right eye exhibits no discharge. Left eye exhibits no discharge. No scleral icterus.  Neck: Normal range of motion. Neck supple. No tracheal deviation present. No thyromegaly present.  Cardiovascular: Normal rate, regular rhythm, normal heart sounds and intact distal pulses.  Exam reveals no gallop and no friction rub.   No murmur heard. Respiratory: Effort normal and breath sounds normal. No respiratory distress. She has no wheezes. She has no rales. She exhibits no tenderness.  GI: Soft. Bowel sounds are normal. She exhibits no distension and no mass. There is no  tenderness. There is no rebound and no guarding.  Genitourinary:  Breasts no masses skin changes or nipple changes bilaterally      Vulva is normal without lesions Vagina is pink moist without discharge Cervix normal in appearance and pap is done Uterus is normal size shape and contour Adnexa is negative with normal sized ovaries   Musculoskeletal: Normal range of motion. She exhibits no edema and no tenderness.  Neurological: She is alert and oriented to person, place, and time. She has normal reflexes. She displays normal reflexes. No cranial nerve deficit. She exhibits normal muscle tone. Coordination normal.  Skin: Skin is warm and dry. No rash noted. No erythema. No pallor.  Psychiatric: She has a normal mood and affect. Her behavior is normal. Judgment and thought content normal.       Medications Ordered at today's visit: No orders of the defined types were placed in this encounter.   Other orders placed at today's visit: No orders of the defined types were placed in this encounter.     Assessment:    Normal Gyn exam.   ASCUS last year Dysmenorrhea on ibuprofen/nuva ring, now off Nuva ring Plan:    Contraception: none. Follow up in: 3 years.     Return in about 3 years (around 09/19/2023) for yearly.

## 2020-09-21 LAB — CYTOLOGY - PAP
Comment: NEGATIVE
Diagnosis: NEGATIVE
High risk HPV: NEGATIVE

## 2020-11-24 ENCOUNTER — Ambulatory Visit (INDEPENDENT_AMBULATORY_CARE_PROVIDER_SITE_OTHER): Payer: 59

## 2020-11-24 ENCOUNTER — Encounter: Payer: Self-pay | Admitting: Family Medicine

## 2020-11-24 ENCOUNTER — Other Ambulatory Visit: Payer: Self-pay

## 2020-11-24 ENCOUNTER — Telehealth (INDEPENDENT_AMBULATORY_CARE_PROVIDER_SITE_OTHER): Payer: 59 | Admitting: Family Medicine

## 2020-11-24 DIAGNOSIS — J029 Acute pharyngitis, unspecified: Secondary | ICD-10-CM

## 2020-11-24 LAB — POCT RAPID STREP A (OFFICE): Rapid Strep A Screen: NEGATIVE

## 2020-11-24 NOTE — Progress Notes (Signed)
Virtual Visit via Video Note  I connected with Susan Chandler on 11/24/20 at  4:00 PM EDT by a video enabled telemedicine application and verified that I am speaking with the correct person using two identifiers.  Location patient: home, [place] Location provider:work or home office Persons participating in the virtual visit: patient, provider  I discussed the limitations of evaluation and management by telemedicine and the availability of in person appointments. The patient expressed understanding and agreed to proceed.  Telemedicine visit is a necessity given the COVID-19 restrictions in place at the current time.  HPI: 30 y/o WF being seen today for sore throat and headache. Onset of bad ST about 3 d/a, w/out any nasal cong/runny nose, or signif cough. Took sudafed yesterday b/c thought it may be sinus drainage causing ST. "Feels like knives in throat".  No fever.   +Exposed to ill nephew recently--croupy cough, went to pediatrician, viral resp/croup dx'd. No home covid test has been done. Using ibup for HA, sometimes nyquil with acetaminophen.  PMP AWARE reviewed today: most recent rx for lorazepam 0.5mg  was filled 04/25/20, # 60, rx by me. No red flags.  ROS: See pertinent positives and negatives per HPI.  Past Medical History:  Diagnosis Date  . Abdominal pain    Persistent Mar/Apr 2020: w/u pretty unrevealing by me (Luella Gardenhire); Susan Chandler to see GI 10/21/18.  [name] Abnormal uterine bleeding    Dr. Despina Hidden. Nuvaring as of 09/2019  . Bipolar disorder [facility])    r/o OCD per psychiatrist 03/2016.  Also, psychologist eval revealed suspected Bipolar II, depressed phase 04/16/2016.  [name] Chronic pain syndrome   . Dysmenorrhea    mgmt per Dr. Despina Hidden (01/2018).  Nuva Ring vag inserts 07/2018.  [name] Erosive gastropathy 10/2018   Nonbleeding (EGD 10/2018)  . GAD (generalized anxiety disorder)   . Infectious mononucleosis 2007  . Lumbosacral radiculopathy at L5 2015   [facility]; then 2nd  opinion with Dr. Shelle Iron at [facility] ortho, where she got ESI left L5-S1 11/2013 with moderate improvement.  Saw neurosurgeon 01/2016: surgery NOT recommended.  Susan Chandler initiated.  . Migraine syndrome   . Obesity, Class I, BMI 30-34.9   . Pre-syncope 2016   ? POTS.  Holter showed mostly NSR, occ PACs, one run of nonsustained atrial tachy.  . Salmonella enteritis 09/2017    Past Surgical History:  Procedure Laterality Date  . BIOPSY  10/26/2018   Procedure: BIOPSY;  Surgeon: Malissa Hippo, MD;  [facility];  Service: Endoscopy;;  . ESOPHAGOGASTRODUODENOSCOPY  10/2018   Nonbleeding erosive gastropathy, H pylori NEG.  . ESOPHAGOGASTRODUODENOSCOPY (EGD) WITH PROPOFOL N/A 10/26/2018   Procedure: ESOPHAGOGASTRODUODENOSCOPY (EGD) WITH PROPOFOL;  Surgeon: Malissa Hippo, MD;  [facility];  Service: Endoscopy;  Laterality: N/A;  . WISDOM TOOTH EXTRACTION       Current Outpatient Medications:  .  acetaminophen (TYLENOL) 500 MG tablet, Take 500 mg by mouth every 6 (six) hours as needed for mild pain or moderate pain., Disp: , Rfl:  .  fluticasone (FLONASE) 50 MCG/ACT nasal spray, Place into both nostrils daily as needed for allergies or rhinitis.  (Patient not taking: No sig reported), Disp: , Rfl:  .  ibuprofen (ADVIL) 200 MG tablet, Take 400 mg by mouth every 6 (six) hours as needed for moderate pain. (Patient not taking: Reported on 09/18/2020), Disp: , Rfl:  .  linaclotide (LINZESS) 145 MCG CAPS capsule, Take 1 capsule (145 mcg total) by mouth daily before  breakfast., Disp: 90 capsule, Rfl: 3 .  LORazepam (ATIVAN) 0.5 MG tablet, Take 1 tablet (0.5 mg total) by mouth every 8 (eight) hours as needed for anxiety. (Patient not taking: Reported on 09/18/2020), Disp: 60 tablet, Rfl: 1 .  rizatriptan (MAXALT) 10 MG tablet, TAKE 1 TABLET (10 MG TOTAL) BY MOUTH AS NEEDED FOR MIGRAINE. MAY REPEAT IN 2 HOURS IF NEEDED (Patient not taking: Reported on 09/18/2020), Disp: 10 tablet, Rfl:  1  EXAM:  VITALS per patient if applicable:  Vitals with BMI 09/18/2020 04/25/2020 03/06/2020  Height 5\' 5"  5' 5.25" -  Weight 155 lbs 155 lbs 150 lbs  BMI 25.79 25.61 -  Systolic 113 98 -  Diastolic 76 64 -  Pulse 96 72 -  Some encounter information is confidential and restricted. Go to Review Flowsheets activity to see all data.     GENERAL: alert, oriented, appears well and in no acute distress  HEENT: atraumatic, conjunttiva clear, no obvious abnormalities on inspection of external nose and ears  NECK: normal movements of the head and neck  LUNGS: on inspection no signs of respiratory distress, breathing rate appears normal, no obvious gross SOB, gasping or wheezing  CV: no obvious cyanosis  MS: moves all visible extremities without noticeable abnormality  PSYCH/NEURO: pleasant and cooperative, no obvious depression or anxiety, speech and thought processing grossly intact  LAB today: rapid strep NEG  ASSESSMENT AND PLAN:  Discussed the following assessment and plan:  Acute pharyngitis, suspect viral etiology. Rapid strep negative today. Covid PCR swab sent out today. Discussed symptomatic care with 600 mg ibuprofen q6h prn, salt water gargle prn, chloraseptic spray prn.   I discussed the assessment and treatment plan with the patient. The patient was provided an opportunity to ask questions and all were answered. The patient agreed with the plan and demonstrated an understanding of the instructions.   F/u: if not signi improving in 3-4d   Signed:  , MD           11/24/2020

## 2020-11-24 NOTE — Progress Notes (Signed)
Pt here for swabbing. 

## 2020-11-24 NOTE — Telephone Encounter (Signed)
Pt was contacted regarding time frame for swabbing this afternoon. Appt scheduled for 1pm

## 2020-11-25 LAB — NOVEL CORONAVIRUS, NAA: SARS-CoV-2, NAA: NOT DETECTED

## 2020-11-25 LAB — SARS-COV-2, NAA 2 DAY TAT

## 2020-11-28 ENCOUNTER — Telehealth: Payer: Self-pay

## 2020-11-28 NOTE — Telephone Encounter (Signed)
Pt called because she is not getting any better and now has a cough that will not go away. Pt was seen on 11/24/20 and was tested for strep and COVID which all came back normal. Pt requested appt but you do not have anything until Friday afternoon. Please advise of any suggestions of Rx or open availability before Friday.

## 2020-11-28 NOTE — Telephone Encounter (Signed)
Pt scheduled  

## 2020-11-28 NOTE — Telephone Encounter (Signed)
In person visit at 4 oclock tomorrow or Thursday is fine. Recommend otc delsym (store brand/generic is fine) for cough in the meantime.

## 2020-11-29 ENCOUNTER — Ambulatory Visit: Payer: 59 | Admitting: Family Medicine

## 2020-11-29 ENCOUNTER — Encounter: Payer: Self-pay | Admitting: Family Medicine

## 2020-11-29 ENCOUNTER — Other Ambulatory Visit: Payer: Self-pay

## 2020-11-29 VITALS — BP 102/65 | HR 94 | Temp 97.9°F | Wt 159.8 lb

## 2020-11-29 DIAGNOSIS — J069 Acute upper respiratory infection, unspecified: Secondary | ICD-10-CM

## 2020-11-29 DIAGNOSIS — J209 Acute bronchitis, unspecified: Secondary | ICD-10-CM

## 2020-11-29 MED ORDER — ALBUTEROL SULFATE HFA 108 (90 BASE) MCG/ACT IN AERS: 2.0000 | INHALATION_SPRAY | RESPIRATORY_TRACT | 0 refills | Status: DC | PRN

## 2020-11-29 MED ORDER — BENZONATATE 200 MG PO CAPS: 200.0000 mg | ORAL_CAPSULE | Freq: Three times a day (TID) | ORAL | 0 refills | Status: DC | PRN

## 2020-11-29 MED ORDER — PREDNISONE 20 MG PO TABS: ORAL_TABLET | ORAL | 0 refills | Status: DC

## 2020-11-29 NOTE — Progress Notes (Signed)
OFFICE VISIT  11/29/2020  CC:  Chief Complaint  Patient presents with  . Cough    HPI:    Patient is a 30 y.o. Caucasian female who presents for ongoing resp symptoms. I saw her for telemed visit 11/24/20 (5 d/a). A/p as of that visit: "Acute pharyngitis, suspect viral etiology. Rapid strep negative today. Covid PCR swab sent out today. Discussed symptomatic care with 600 mg ibuprofen q6h prn, salt water gargle prn, chloraseptic spray prn."  INTERIM HX: Her covid was neg.  Dry cough is her worst symptom now. Worse when she lays down. Feeling of hard to breath, out of breath easily.  Still with nasal congestion. HA in occipital area when she coughs.  No fevers.  ST intensity is much less now.  Had some diarrhea 2 d/a.  No n/v. She hydrates well.  ROS as above, plus--> no CP, no dizziness, no HAs, no rashes, no melena/hematochezia.  No polyuria or polydipsia.  No myalgias or arthralgias.  No focal weakness, paresthesias, or tremors.  No acute vision or hearing abnormalities.  No dysuria or unusual/new urinary urgency or frequency.  No recent changes in lower legs. No abd pain.  No palpitations.     Past Medical History:  Diagnosis Date  . Abdominal pain    Persistent Mar/Apr 2020: w/u pretty unrevealing by me (Sayer Masini); pt to see GI 10/21/18.  [name] Abnormal uterine bleeding    Dr. Despina Hidden. Nuvaring as of 09/2019  . Bipolar disorder [facility])    r/o OCD per psychiatrist 03/2016.  Also, psychologist eval revealed suspected Bipolar II, depressed phase 04/16/2016.  [name] Chronic pain syndrome   . Dysmenorrhea    mgmt per Dr. Despina Hidden (01/2018).  Nuva Ring vag inserts 07/2018.  [name] Erosive gastropathy 10/2018   Nonbleeding (EGD 10/2018)  . GAD (generalized anxiety disorder)   . Infectious mononucleosis 2007  . Lumbosacral radiculopathy at L5 2015   [facility]; then 2nd opinion with [facility]. Shelle Iron [facility], where she got ESI left L5-S1 11/2013 with moderate improvement.  Saw  neurosurgeon 01/2016: surgery NOT recommended.  PT initiated.  . Migraine syndrome   . Obesity, Class I, BMI 30-34.9   . Pre-syncope 2016   ? POTS.  Holter showed mostly NSR, occ PACs, one run of nonsustained atrial tachy.  . Salmonella enteritis 09/2017    Past Surgical History:  Procedure Laterality Date  . BIOPSY  10/26/2018   Procedure: BIOPSY;  Surgeon: Malissa Hippo, MD;  [facility];  Service: Endoscopy;;  . ESOPHAGOGASTRODUODENOSCOPY  10/2018   Nonbleeding erosive gastropathy, H pylori NEG.  . ESOPHAGOGASTRODUODENOSCOPY (EGD) WITH PROPOFOL N/A 10/26/2018   Procedure: ESOPHAGOGASTRODUODENOSCOPY (EGD) WITH PROPOFOL;  Surgeon: Malissa Hippo, MD;  [facility];  Service: Endoscopy;  Laterality: N/A;  . WISDOM TOOTH EXTRACTION      Outpatient Medications Prior to Visit  Medication Sig Dispense Refill  . acetaminophen (TYLENOL) 500 MG tablet Take 500 mg by mouth every 6 (six) hours as needed for mild pain or moderate pain.    . Doxylamine Succinate, Sleep, (SLEEP AID PO) Take by mouth.    . fluticasone (FLONASE) 50 MCG/ACT nasal spray Place into both nostrils daily as needed for allergies or rhinitis.    [name] ibuprofen (ADVIL) 200 MG tablet Take 400 mg by mouth every 6 (six) hours as needed for moderate pain.    [name] linaclotide (LINZESS) 145 MCG CAPS capsule Take 1 capsule (145 mcg total) by  mouth daily before breakfast. 90 capsule 3  . LORazepam (ATIVAN) 0.5 MG tablet Take 1 tablet (0.5 mg total) by mouth every 8 (eight) hours as needed for anxiety. 60 tablet 1  . rizatriptan (MAXALT) 10 MG tablet TAKE 1 TABLET (10 MG TOTAL) BY MOUTH AS NEEDED FOR MIGRAINE. MAY REPEAT IN 2 HOURS IF NEEDED 10 tablet 1   No facility-administered medications prior to visit.    Allergies  Allergen Reactions  . Fish-Derived Products Nausea And Vomiting    Shell fish make her sick  . Shellfish Allergy Nausea And Vomiting    ROS As per HPI  PE: Vitals with BMI 11/29/2020  09/18/2020 04/25/2020  Height - 5\' 5"  5' 5.25"  Weight 159 lbs 13 oz 155 lbs 155 lbs  BMI - 25.79 25.61  Systolic 102 113 98  Diastolic 65 76 64  Pulse 94 96 72  Some encounter information is confidential and restricted. Go to Review Flowsheets activity to see all data.   VS: noted--normal. Gen: alert, NAD, NONTOXIC APPEARING. HEENT: eyes without injection, drainage, or swelling.  Ears: EACs clear, TMs with normal light reflex and landmarks.  Nose: Clear rhinorrhea, with some dried, crusty exudate adherent to mildly injected mucosa.  No purulent d/c.  No paranasal sinus TTP.  No facial swelling.  Throat and mouth without focal lesion.  No pharyngial swelling, erythema, or exudate.   Neck: supple, no LAD.   LUNGS: CTA bilat, nonlabored resps.  Mild diminished aeration diffusely on exhalation, mild prolongation of exp phase, no wheezing.  Forced exp maneuver brings on the need for pt to stifle a cough. CV: RRR, no m/r/g. EXT: no c/c/e SKIN: no rash  LABS:  none  IMPRESSION AND PLAN:  Viral URI with acute bronchitis. Prednisone 40mg  qd x 5d. Albuterol hfa 1-2p q4h prn. Tessalon pearls 200mg  tid prn, #30, no RF. Push fluids, rest. Signs/symptoms to call or return for were reviewed and pt expressed understanding.  An After Visit Summary was printed and given to the patient.  FOLLOW UP: Return if symptoms worsen or fail to improve.  Signed:  , MD           11/29/2020

## 2020-12-01 ENCOUNTER — Ambulatory Visit: Payer: 59 | Admitting: Family Medicine

## 2020-12-26 ENCOUNTER — Other Ambulatory Visit: Payer: Self-pay | Admitting: Family Medicine

## 2021-01-23 ENCOUNTER — Encounter: Payer: Self-pay | Admitting: Family Medicine

## 2021-01-26 ENCOUNTER — Ambulatory Visit: Payer: 59 | Admitting: Family Medicine

## 2021-01-26 ENCOUNTER — Encounter: Payer: Self-pay | Admitting: Family Medicine

## 2021-01-26 ENCOUNTER — Other Ambulatory Visit: Payer: Self-pay

## 2021-01-26 VITALS — BP 105/65 | HR 87 | Temp 98.1°F | Resp 14 | Ht 65.0 in | Wt 163.0 lb

## 2021-01-26 DIAGNOSIS — R29898 Other symptoms and signs involving the musculoskeletal system: Secondary | ICD-10-CM

## 2021-01-26 DIAGNOSIS — G549 Nerve root and plexus disorder, unspecified: Secondary | ICD-10-CM | POA: Diagnosis not present

## 2021-01-26 DIAGNOSIS — M5442 Lumbago with sciatica, left side: Secondary | ICD-10-CM

## 2021-01-26 DIAGNOSIS — R292 Abnormal reflex: Secondary | ICD-10-CM

## 2021-01-26 MED ORDER — OXYCODONE HCL 5 MG PO TABS: ORAL_TABLET | ORAL | 0 refills | Status: DC

## 2021-01-26 MED ORDER — PREDNISONE 20 MG PO TABS: ORAL_TABLET | ORAL | 0 refills | Status: DC

## 2021-01-26 NOTE — Progress Notes (Signed)
OFFICE VISIT  01/26/2021  CC:  Chief Complaint  Patient presents with   Back Pain    LBP; pinched nerve. Has been seeing chiropractor, Dr.Taylor for pain mgmt but would like PCP opinion and try other options, if available.   HPI:    Patient is a 30 y.o. Caucasian female who presents for low back pain. About 2 wks ago her L LB pain with rad down L leg got a lot worse.  L foot numb/tingling intermittently.  Left leg feeling weak when ambulating.  No loss of bowel/bladder control.  No saddle anesthesia.  Tylenol qd alt with ibup 400 qd. Hx of chronic LBP with L lumbar radiculopathy, remote hx of ESI L5-S1 + PT, was also rx'd opioids for pain control on chronic basis by me in the remote past (ending in late 2019/early 2020). She recalls ESI helping a few weeks or so in the past.  PMP AWARE reviewed today: most recent rx for lorazepam 0.5mg  was filled 04/25/20, # 60, rx by me. No red flags.  Past Medical History:  Diagnosis Date   Abdominal pain    Persistent Mar/Apr 2020: w/u pretty unrevealing by me (Kannon Baum); pt to see GI 10/21/18.   Abnormal uterine bleeding    Dr. Despina Hidden. Nuvaring as of 09/2019   Bipolar disorder ([facility])    r/o OCD per psychiatrist 03/2016.  Also, psychologist eval revealed suspected Bipolar II, depressed phase 04/16/2016.   Chronic pain syndrome    Dysmenorrhea    mgmt per Dr. Despina Hidden (01/2018).  Nuva Ring vag inserts 07/2018.   Erosive gastropathy 10/2018   Nonbleeding (EGD 10/2018)   GAD (generalized anxiety disorder)    Infectious mononucleosis 2007   Lumbosacral radiculopathy at L5 2015   [facility]; then 2nd opinion with Dr. Shelle Iron at [facility]o, where she got ESI left L5-S1 11/2013 with moderate improvement.  Saw neurosurgeon 01/2016: surgery NOT recommended.  PT initiated.   Migraine syndrome    Obesity, Class I, BMI 30-34.9    Pre-syncope 2016   ? POTS.  Holter showed mostly NSR, occ PACs, one run of nonsustained atrial tachy.    Salmonella enteritis 09/2017    Past Surgical History:  Procedure Laterality Date   BIOPSY  10/26/2018   Procedure: BIOPSY;  Surgeon: Malissa Hippo, MD;  [facility];  Service: Endoscopy;;   ESOPHAGOGASTRODUODENOSCOPY  10/2018   Nonbleeding erosive gastropathy, H pylori NEG.   ESOPHAGOGASTRODUODENOSCOPY (EGD) WITH PROPOFOL N/A 10/26/2018   Procedure: ESOPHAGOGASTRODUODENOSCOPY (EGD) WITH PROPOFOL;  Surgeon: Malissa Hippo, MD;  [facility];  Service: Endoscopy;  Laterality: N/A;   WISDOM TOOTH EXTRACTION      Outpatient Medications Prior to Visit  Medication Sig Dispense Refill   acetaminophen (TYLENOL) 500 MG tablet Take 500 mg by mouth every 6 (six) hours as needed for mild pain or moderate pain.     Doxylamine Succinate, Sleep, (SLEEP AID PO) Take by mouth.     ibuprofen (ADVIL) 200 MG tablet Take 400 mg by mouth every 6 (six) hours as needed for moderate pain.     LORazepam (ATIVAN) 0.5 MG tablet Take 1 tablet (0.5 mg total) by mouth every 8 (eight) hours as needed for anxiety. 60 tablet 1   albuterol (VENTOLIN HFA) 108 (90 Base) MCG/ACT inhaler INHALE 2 PUFFS INTO THE LUNGS EVERY 4 HOURS AS NEEDED FOR WHEEZING OR SHORTNESS OF BREATH. (Patient not taking: Reported on 01/26/2021) 8 g 1   fluticasone (FLONASE)  50 MCG/ACT nasal spray Place into both nostrils daily as needed for allergies or rhinitis. (Patient not taking: Reported on 01/26/2021)     linaclotide (LINZESS) 145 MCG CAPS capsule Take 1 capsule (145 mcg total) by mouth daily before breakfast. (Patient not taking: Reported on 01/26/2021) 90 capsule 3   rizatriptan (MAXALT) 10 MG tablet TAKE 1 TABLET (10 MG TOTAL) BY MOUTH AS NEEDED FOR MIGRAINE. MAY REPEAT IN 2 HOURS IF NEEDED (Patient not taking: Reported on 01/26/2021) 10 tablet 1   benzonatate (TESSALON) 200 MG capsule Take 1 capsule (200 mg total) by mouth 3 (three) times daily as needed for cough. (Patient not taking: Reported on 01/26/2021) 30 capsule 0    predniSONE (DELTASONE) 20 MG tablet 2 tabs po qd x 5d (Patient not taking: Reported on 01/26/2021) 10 tablet 0   No facility-administered medications prior to visit.    Allergies  Allergen Reactions   Fish-Derived Products Nausea And Vomiting    Shell fish make her sick   Shellfish Allergy Nausea And Vomiting    ROS As per HPI  PE: Vitals with BMI 01/26/2021 11/29/2020 09/18/2020  Height 5\' 5"  - 5\' 5"   Weight 163 lbs 159 lbs 13 oz 155 lbs  BMI 27.12 - 25.79  Systolic 105 102  Diastolic 65 65 76  Pulse 87 94 96  Some encounter information is confidential and restricted. Go to Review Flowsheets activity to see all data.   Exam chaperoned by , CMA. Gen: Alert, well appearing.  Patient is oriented to person, place, time, and situation. AFFECT: pleasant, lucid thought and speech. Moves slowly to exam table, can't get onto exam table unless steps up with R leg. Diffuse TTP in L/S spine central and paraspinous, ROM to 25 deg flexion, 10 deg ext. L leg prox strength 5/5, R prox 5/5. L leg distal strength 4/5 with ankle flexion and extension.  Patellar DTRs 1+ bilat. R achilles DTR 1+, L absent.  LABS:    Chemistry      Component Value Date/Time   NA 142 10/16/2018 1124   K 4.8 10/16/2018 1124   CL 105 10/16/2018 1124   CO2 30 10/16/2018 1124   BUN 11 10/16/2018 1124   CREATININE 0.74 10/16/2018 1124      Component Value Date/Time   CALCIUM 9.0 10/16/2018 1124   ALKPHOS 45 10/16/2018 1124   AST 14 10/16/2018 1124   ALT 13 10/16/2018 1124   BILITOT 0.4 10/16/2018 1124      Last MR L spine back in 2016 showed : IMPRESSION: 1. At L5-S1 there is a right paracentral disc protrusion in close proximity to the right intraspinal S1 nerve root which is slightly enlarged compared with 07/17/2013. 2. Mild broad-based disc bulge at L4-5."   IMPRESSION AND PLAN:  Acute-on-chronic severe low back pain with L radiculopathy. Distal weakness in L leg + absent  achilles DTR on L. Will proceed with MRI L/S spine w/out contrast. In my opinion she is in too much pain to be able to participate in PT. She has gone to the chiropracter a couple times since this occurred and no help. Prednisone 40mg  qd x 3d, then 20mg  qd x 3d. Oxycodone short term, 5mg , 1-2 bid prn, #30.  She is aware of the short and long term side effect potential as well as addiction/dependence risks with this medication. Will get her to orthopedics--new referral to [facility].  She has seen them in the past.  An After Visit Summary was printed  and given to the patient.  FOLLOW UP: Return for to be determined based on results of imaging and timing of orthopedics visit.  Signed:  Santiago Bumpers, MD           01/26/2021

## 2021-01-29 ENCOUNTER — Telehealth: Payer: Self-pay | Admitting: Family Medicine

## 2021-01-29 NOTE — Telephone Encounter (Signed)
Spoke with Gunnar Fusi at [employer] for MRI prior authorization. Peer to peer review required within 2 days. Please call [phone], case# is [id number].

## 2021-01-29 NOTE — Telephone Encounter (Signed)
This is regarding imaging ordered on 08/05.   Please advise, thanks.

## 2021-01-29 NOTE — Telephone Encounter (Signed)
OK, done. Notification # is [phone]

## 2021-02-13 ENCOUNTER — Encounter: Payer: Self-pay | Admitting: Family Medicine

## 2021-02-22 ENCOUNTER — Encounter: Payer: Self-pay | Admitting: Family Medicine

## 2021-03-06 ENCOUNTER — Ambulatory Visit (INDEPENDENT_AMBULATORY_CARE_PROVIDER_SITE_OTHER): Payer: 59

## 2021-03-06 ENCOUNTER — Other Ambulatory Visit: Payer: Self-pay

## 2021-03-06 VITALS — BP 127/80 | HR 89 | Ht 66.0 in | Wt 168.0 lb

## 2021-03-06 DIAGNOSIS — Z3201 Encounter for pregnancy test, result positive: Secondary | ICD-10-CM

## 2021-03-06 DIAGNOSIS — Z3200 Encounter for pregnancy test, result unknown: Secondary | ICD-10-CM

## 2021-03-06 LAB — POCT URINE PREGNANCY: Preg Test, Ur: POSITIVE — AB

## 2021-03-06 NOTE — Progress Notes (Addendum)
   NURSE VISIT- PREGNANCY CONFIRMATION   SUBJECTIVE:  Susan Chandler is a 30 y.o. G1P0000 female at [redacted]w[redacted]d by certain LMP of Patient's last menstrual period was 12/20/2020 (exact date). Here for pregnancy confirmation.  Home pregnancy test: positive x 1   She reports nausea.  She is taking prenatal vitamins.    OBJECTIVE:  BP 127/80 (BP Location: Left Arm, Patient Position: Sitting, Cuff Size: Normal)   Pulse 89   Ht 5\' 6"  (1.676 m)   Wt 168 lb (76.2 kg)   LMP 12/20/2020 (Exact Date)   BMI 27.12 kg/m   Appears well, in no apparent distress  Results for orders placed or performed in visit on 03/06/21 (from the past 24 hour(s))  POCT urine pregnancy   Collection Time: 03/06/21  2:44 PM  Result Value Ref Range   Preg Test, Ur Positive (A) Negative    ASSESSMENT: Positive pregnancy test, [redacted]w[redacted]d by LMP    PLAN: Schedule for dating ultrasound in 1 weeks Prenatal vitamins: continue   Nausea medicines: not currently needed   OB packet given: Yes  Susan Chandler A Missey Hasley  03/06/2021 2:55 PM   Chart reviewed for nurse visit. Agree with plan of care.  03/08/2021, Cheral Marker 03/06/2021 4:36 PM

## 2021-03-14 ENCOUNTER — Other Ambulatory Visit: Payer: Self-pay | Admitting: Women's Health

## 2021-03-14 DIAGNOSIS — O3680X0 Pregnancy with inconclusive fetal viability, not applicable or unspecified: Secondary | ICD-10-CM

## 2021-03-15 ENCOUNTER — Ambulatory Visit (INDEPENDENT_AMBULATORY_CARE_PROVIDER_SITE_OTHER): Payer: 59

## 2021-03-15 ENCOUNTER — Other Ambulatory Visit: Payer: Self-pay

## 2021-03-15 DIAGNOSIS — Z3A12 12 weeks gestation of pregnancy: Secondary | ICD-10-CM | POA: Diagnosis not present

## 2021-03-15 DIAGNOSIS — O3680X0 Pregnancy with inconclusive fetal viability, not applicable or unspecified: Secondary | ICD-10-CM

## 2021-03-15 NOTE — Progress Notes (Signed)
[place] 10+6 wks,single IUP,CRL 40.05 mm,normal ovaries,fhr 169 bpm

## 2021-03-26 ENCOUNTER — Other Ambulatory Visit: Payer: Self-pay | Admitting: Obstetrics & Gynecology

## 2021-03-26 DIAGNOSIS — Z3682 Encounter for antenatal screening for nuchal translucency: Secondary | ICD-10-CM

## 2021-03-27 ENCOUNTER — Encounter: Payer: Self-pay | Admitting: Family Medicine

## 2021-03-27 ENCOUNTER — Telehealth (INDEPENDENT_AMBULATORY_CARE_PROVIDER_SITE_OTHER): Payer: 59 | Admitting: Family Medicine

## 2021-03-27 ENCOUNTER — Other Ambulatory Visit: Payer: Self-pay

## 2021-03-27 ENCOUNTER — Other Ambulatory Visit: Payer: 59

## 2021-03-27 ENCOUNTER — Ambulatory Visit (INDEPENDENT_AMBULATORY_CARE_PROVIDER_SITE_OTHER): Payer: 59

## 2021-03-27 DIAGNOSIS — J209 Acute bronchitis, unspecified: Secondary | ICD-10-CM

## 2021-03-27 DIAGNOSIS — B349 Viral infection, unspecified: Secondary | ICD-10-CM

## 2021-03-27 DIAGNOSIS — Z3682 Encounter for antenatal screening for nuchal translucency: Secondary | ICD-10-CM

## 2021-03-27 DIAGNOSIS — Z3491 Encounter for supervision of normal pregnancy, unspecified, first trimester: Secondary | ICD-10-CM | POA: Diagnosis not present

## 2021-03-27 DIAGNOSIS — Z3A12 12 weeks gestation of pregnancy: Secondary | ICD-10-CM

## 2021-03-27 DIAGNOSIS — Z3401 Encounter for supervision of normal first pregnancy, first trimester: Secondary | ICD-10-CM

## 2021-03-27 MED ORDER — PREDNISONE 20 MG PO TABS: ORAL_TABLET | ORAL | 0 refills | Status: DC

## 2021-03-27 NOTE — Progress Notes (Signed)
[place] 12+4 wks,measurements c/w dates,CRL 71.79 mm,normal ovaries,NB present,NT 1.2 mm,FHR 161 bpm

## 2021-03-27 NOTE — Progress Notes (Signed)
Virtual Visit via Video Note  I connected with Susan Chandler on 03/27/21 at  8:30 AM EDT by a video enabled telemedicine application and verified that I am speaking with the correct person using two identifiers.  Location patient: home, [place] Location provider:work or home office Persons participating in the virtual visit: patient, provider  I discussed the limitations of evaluation and management by telemedicine and the availability of in person appointments. The patient expressed understanding and agreed to proceed.  Telemedicine visit is a necessity given the COVID-19 restrictions in place at the current time.  HPI: 30 y/o WF being seen today for cough. Onset 7 d/a fever n/v/d, nasal cong and dry cough. GI sx's resolved, dry cough and nasal congestion and signif fatigue have persisted.  Chest feels tight. No wheezing. No SOB or DOE or chest pain. Using mucinex. She is about [redacted] wks pregnant. Home covid test in early part of the illness was neg but has had a couple family members recently with covid positive illness.  ROS: See pertinent positives and negatives per HPI.  Past Medical History:  Diagnosis Date   Abdominal pain    Persistent Mar/Apr 2020: w/u pretty unrevealing by me ([place] Decoste); pt to see GI 10/21/18.   Abnormal uterine bleeding    Dr. Despina Hidden. Nuvaring as of 09/2019   Bipolar disorder (HCC)    r/o OCD per psychiatrist 03/2016.  Also, psychologist eval revealed suspected Bipolar II, depressed phase 04/16/2016.   Chronic pain syndrome    Dysmenorrhea    mgmt per Dr. Despina Hidden (01/2018).  Nuva Ring vag inserts 07/2018.   Erosive gastropathy 10/2018   Nonbleeding (EGD 10/2018)   GAD (generalized anxiety disorder)    Infectious mononucleosis 2007   Lumbosacral radiculopathy at L5 2015   [facility]; then 2nd opinion with Dr. Shelle Iron at [facility], where she got ESI left L5-S1 11/2013 with moderate improvement.  Saw neurosurgeon 01/2016: surgery NOT recommended.    Severe recurrence 01/2021->rpt MRI w/out neural compression->plan for ESI L5-S1 left.   Migraine syndrome    Obesity, Class I, BMI 30-34.9    Pre-syncope 2016   ? POTS.  Holter showed mostly NSR, occ PACs, one run of nonsustained atrial tachy.   Salmonella enteritis 09/2017    Past Surgical History:  Procedure Laterality Date   BIOPSY  10/26/2018   Procedure: BIOPSY;  Surgeon: Malissa Hippo, MD;  [facility];  Service: Endoscopy;;   ESOPHAGOGASTRODUODENOSCOPY  10/2018   Nonbleeding erosive gastropathy, H pylori NEG.   ESOPHAGOGASTRODUODENOSCOPY (EGD) WITH PROPOFOL N/A 10/26/2018   Procedure: ESOPHAGOGASTRODUODENOSCOPY (EGD) WITH PROPOFOL;  Surgeon: Malissa Hippo, MD;  [facility]: [facility];  Service: Endoscopy;  Laterality: N/A;   WISDOM TOOTH EXTRACTION       Current Outpatient Medications:    acetaminophen (TYLENOL) 500 MG tablet, Take 500 mg by mouth every 6 (six) hours as needed for mild pain or moderate pain. (Patient not taking: Reported on 03/06/2021), Disp: , Rfl:    albuterol (VENTOLIN HFA) 108 (90 Base) MCG/ACT inhaler, INHALE 2 PUFFS INTO THE LUNGS EVERY 4 HOURS AS NEEDED FOR WHEEZING OR SHORTNESS OF BREATH. (Patient not taking: No sig reported), Disp: 8 g, Rfl: 1   Doxylamine Succinate, Sleep, (SLEEP AID PO), Take by mouth. (Patient not taking: Reported on 03/06/2021), Disp: , Rfl:    fluticasone (FLONASE) 50 MCG/ACT nasal spray, Place into both nostrils daily as needed for allergies or rhinitis. (Patient not taking: No sig reported), Disp: ,  Rfl:    linaclotide (LINZESS) 145 MCG CAPS capsule, Take 1 capsule (145 mcg total) by mouth daily before breakfast. (Patient not taking: No sig reported), Disp: 90 capsule, Rfl: 3   Prenatal Vit-Fe Fumarate-FA (MULTIVITAMIN-PRENATAL) 27-0.8 MG TABS tablet, Take 1 tablet by mouth daily at 12 noon., Disp: , Rfl:    rizatriptan (MAXALT) 10 MG tablet, TAKE 1 TABLET (10 MG TOTAL) BY MOUTH AS NEEDED FOR MIGRAINE. MAY REPEAT IN 2  HOURS IF NEEDED (Patient not taking: No sig reported), Disp: 10 tablet, Rfl: 1  EXAM:  VITALS per patient if applicable:  Vitals with BMI 03/06/2021 01/26/2021 11/29/2020  Height 5\' 6"  5\' 5"  -  Weight 168 lbs 163 lbs 159 lbs 13 oz  BMI 27.13 27.12 -  Systolic 127 105  Diastolic 80 65 65  Pulse 89 87 94  Some encounter information is confidential and restricted. Go to Review Flowsheets activity to see all data.     GENERAL: alert, oriented, appears well and in no acute distress  HEENT: atraumatic, conjunttiva clear, no obvious abnormalities on inspection of external nose and ears  NECK: normal movements of the head and neck  LUNGS: on inspection no signs of respiratory distress, breathing rate appears normal, no obvious gross SOB, gasping or wheezing  CV: no obvious cyanosis  MS: moves all visible extremities without noticeable abnormality  PSYCH/NEURO: pleasant and cooperative, no obvious depression or anxiety, speech and thought processing grossly intact  LABS: none today    Chemistry      Component Value Date/Time   NA 142 10/16/2018 1124   K 4.8 10/16/2018 1124   CL 105 10/16/2018 1124   CO2 30 10/16/2018 1124   BUN 11 10/16/2018 1124   CREATININE 0.74 10/16/2018 1124      Component Value Date/Time   CALCIUM 9.0 10/16/2018 1124   ALKPHOS 45 10/16/2018 1124   AST 14 10/16/2018 1124   ALT 13 10/16/2018 1124   BILITOT 0.4 10/16/2018 1124      ASSESSMENT AND PLAN:  Discussed the following assessment and plan:  1) Viral syndrome, home covid test neg about 5-6 d/a.  Complicated by pregnant status. Question of false neg b/c multiple covid contacts in home lately. She is out of the treatment window for consideration of antiviral med.  Encouraged her to test again, though, b/c this would be good info to have in case she gets worse in the next week or so. At this point will see if prednisone can help her bronchitis (+hx of mild intermittent asthma). She also has  albut nebs at home to try q6h prn. Recommended d/c of mucinex and start saline nasal spray multiple times a day.   I discussed the assessment and treatment plan with the patient. The patient was provided an opportunity to ask questions and all were answered. The patient agreed with the plan and demonstrated an understanding of the instructions.   F/u: if not imp in 4-5d  Signed:  10/18/2018, MD           03/27/2021

## 2021-03-29 ENCOUNTER — Other Ambulatory Visit: Payer: 59

## 2021-03-29 LAB — CBC/D/PLT+RPR+RH+ABO+RUBIGG...
Antibody Screen: NEGATIVE
Basophils Absolute: 0 10*3/uL (ref 0.0–0.2)
Basos: 0 %
EOS (ABSOLUTE): 0 10*3/uL (ref 0.0–0.4)
Eos: 0 %
HCV Ab: <0.1 s/co ratio (ref 0.0–0.9)
HIV Screen 4th Generation wRfx: NONREACTIVE
Hematocrit: 38.9 % (ref 34.0–46.6)
Hemoglobin: 13.3 g/dL (ref 11.1–15.9)
Hepatitis B Surface Ag: NEGATIVE
Immature Grans (Abs): 0 10*3/uL (ref 0.0–0.1)
Immature Granulocytes: 0 %
Lymphocytes Absolute: 1 10*3/uL (ref 0.7–3.1)
Lymphs: 27 %
MCH: 31.1 pg (ref 26.6–33.0)
MCHC: 34.2 g/dL (ref 31.5–35.7)
MCV: 91 fL (ref 79–97)
Monocytes Absolute: 0.6 10*3/uL (ref 0.1–0.9)
Monocytes: 14 %
Neutrophils Absolute: 2.2 10*3/uL (ref 1.4–7.0)
Neutrophils: 59 %
Platelets: 148 10*3/uL — ABNORMAL LOW (ref 150–450)
RBC: 4.27 x10E6/uL (ref 3.77–5.28)
RDW: 13 % (ref 11.7–15.4)
RPR Ser Ql: NONREACTIVE
Rh Factor: POSITIVE
Rubella Antibodies, IGG: 2.22 index (ref >0.99)
WBC: 3.8 10*3/uL (ref 3.4–10.8)

## 2021-03-29 LAB — INTEGRATED 1
Crown Rump Length: 71.8 mm
Gest. Age on Collection Date: 13.1 weeks
Maternal Age at EDD: 31.8 yr
Nuchal Translucency (NT): 1.2 mm
Number of Fetuses: 1
PAPP-A Value: 1550.3 ng/mL
Weight: 170 [lb_av]

## 2021-03-29 LAB — HCV INTERPRETATION

## 2021-04-05 ENCOUNTER — Encounter: Payer: Self-pay | Admitting: Women's Health

## 2021-04-05 ENCOUNTER — Other Ambulatory Visit: Payer: Self-pay

## 2021-04-05 ENCOUNTER — Ambulatory Visit: Payer: 59 | Admitting: *Deleted

## 2021-04-05 ENCOUNTER — Ambulatory Visit (INDEPENDENT_AMBULATORY_CARE_PROVIDER_SITE_OTHER): Payer: 59 | Admitting: Women's Health

## 2021-04-05 VITALS — BP 120/72 | HR 98 | Wt 179.0 lb

## 2021-04-05 DIAGNOSIS — Z3400 Encounter for supervision of normal first pregnancy, unspecified trimester: Secondary | ICD-10-CM | POA: Insufficient documentation

## 2021-04-05 DIAGNOSIS — Z3A13 13 weeks gestation of pregnancy: Secondary | ICD-10-CM

## 2021-04-05 DIAGNOSIS — R8271 Bacteriuria: Secondary | ICD-10-CM

## 2021-04-05 DIAGNOSIS — O0990 Supervision of high risk pregnancy, unspecified, unspecified trimester: Secondary | ICD-10-CM | POA: Insufficient documentation

## 2021-04-05 DIAGNOSIS — O99891 Other specified diseases and conditions complicating pregnancy: Secondary | ICD-10-CM

## 2021-04-05 DIAGNOSIS — Z3401 Encounter for supervision of normal first pregnancy, first trimester: Secondary | ICD-10-CM

## 2021-04-05 LAB — POCT URINALYSIS DIPSTICK OB
Blood, UA: NEGATIVE
Glucose, UA: NEGATIVE
Ketones, UA: NEGATIVE
Leukocytes, UA: NEGATIVE
Nitrite, UA: NEGATIVE
POC,PROTEIN,UA: NEGATIVE

## 2021-04-05 NOTE — Patient Instructions (Signed)
Susan Chandler, thank you for choosing our office today! We appreciate the opportunity to meet your healthcare needs. You may receive a short survey by mail, e-mail, or through MyChart. If you are happy with your care we would appreciate if you could take just a few minutes to complete the survey questions. We read all of your comments and take your feedback very seriously. Thank you again for choosing our office.  [facility] ([address] [place], [place] [address]) Entrance C, located off of [address] Free 24/7 valet parking   Nausea & Vomiting Have saltine crackers or pretzels by your bed and eat a few bites before you raise your head out of bed in the morning Eat small frequent meals throughout the day instead of large meals Drink plenty of fluids throughout the day to stay hydrated, just don't drink a lot of fluids with your meals.  This can make your stomach fill up faster making you feel sick Do not brush your teeth right after you eat Products with real ginger are good for nausea, like ginger ale and ginger hard candy Make sure it says made with real ginger! Sucking on sour candy like lemon heads is also good for nausea If your prenatal vitamins make you nauseated, take them at night so you will sleep through the nausea Sea Bands If you feel like you need medicine for the nausea & vomiting please let us know If you are unable to keep any fluids or food down please let us know   Constipation Drink plenty of fluid, preferably water, throughout the day Eat foods high in fiber such as fruits, vegetables, and grains Exercise, such as walking, is a good way to keep your bowels regular Drink warm fluids, especially warm prune juice, or decaf coffee Eat a 1/2 cup of real oatmeal (not instant), 1/2 cup applesauce, and 1/2-1 cup warm prune juice every day If needed, you may take Colace (docusate sodium) stool softener  once or twice a day to help keep the stool soft.  If you still are having problems with constipation, you may take Miralax once daily as needed to help keep your bowels regular.   Home Blood Pressure Monitoring for Patients   Your provider has recommended that you check your blood pressure (BP) at least once a week at home. If you do not have a blood pressure cuff at home, one will be provided for you. Contact your provider if you have not received your monitor within 1 week.   Helpful Tips for Accurate Home Blood Pressure Checks  Don't smoke, exercise, or drink caffeine 30 minutes before checking your BP Use the restroom before checking your BP (a full bladder can raise your pressure) Relax in a comfortable upright chair Feet on the ground Left arm resting comfortably on a flat surface at the level of your heart Legs uncrossed Back supported Sit quietly and don't talk Place the cuff on your bare arm Adjust snuggly, so that only two fingertips can fit between your skin and the top of the cuff Check 2 readings separated by at least one minute Keep a log of your BP readings For a visual, please reference this diagram: http://ccnc.care/bpdiagram  Provider Name: [facility]     Phone: [phone]  Zone 1: ALL CLEAR  Continue to monitor your symptoms:  BP reading is less than 140 (top number) or less than 90 (bottom   number)  No right upper stomach pain No headaches or seeing spots No feeling nauseated or throwing up No swelling in face and hands  Zone 2: CAUTION Call your doctor's office for any of the following:  BP reading is greater than 140 (top number) or greater than 90 (bottom number)  Stomach pain under your ribs in the middle or right side Headaches or seeing spots Feeling nauseated or throwing up Swelling in face and hands  Zone 3: EMERGENCY  Seek immediate medical care if you have any of the following:  BP reading is greater than160 (top number) or greater than  110 (bottom number) Severe headaches not improving with Tylenol Serious difficulty catching your breath Any worsening symptoms from Zone 2    First Trimester of Pregnancy The first trimester of pregnancy is from week 1 until the end of week 12 (months 1 through 3). A week after a sperm fertilizes an egg, the egg will implant on the wall of the uterus. This embryo will begin to develop into a baby. Genes from you and your partner are forming the baby. The female genes determine whether the baby is a boy or a girl. At 6-8 weeks, the eyes and face are formed, and the heartbeat can be seen on ultrasound. At the end of 12 weeks, all the baby's organs are formed.  Now that you are pregnant, you will want to do everything you can to have a healthy baby. Two of the most important things are to get good prenatal care and to follow your health care provider's instructions. Prenatal care is all the medical care you receive before the baby's birth. This care will help prevent, find, and treat any problems during the pregnancy and childbirth. BODY CHANGES Your body goes through many changes during pregnancy. The changes vary from woman to woman.  You may gain or lose a couple of pounds at first. You may feel sick to your stomach (nauseous) and throw up (vomit). If the vomiting is uncontrollable, call your health care provider. You may tire easily. You may develop headaches that can be relieved by medicines approved by your health care provider. You may urinate more often. Painful urination may mean you have a bladder infection. You may develop heartburn as a result of your pregnancy. You may develop constipation because certain hormones are causing the muscles that push waste through your intestines to slow down. You may develop hemorrhoids or swollen, bulging veins (varicose veins). Your breasts may begin to grow larger and become tender. Your nipples may stick out more, and the tissue that surrounds them  (areola) may become darker. Your gums may bleed and may be sensitive to brushing and flossing. Dark spots or blotches (chloasma, mask of pregnancy) may develop on your face. This will likely fade after the baby is born. Your menstrual periods will stop. You may have a loss of appetite. You may develop cravings for certain kinds of food. You may have changes in your emotions from day to day, such as being excited to be pregnant or being concerned that something may go wrong with the pregnancy and baby. You may have more vivid and strange dreams. You may have changes in your hair. These can include thickening of your hair, rapid growth, and changes in texture. Some women also have hair loss during or after pregnancy, or hair that feels dry or thin. Your hair will most likely return to normal after your baby is born. WHAT TO EXPECT AT YOUR PRENATAL  VISITS During a routine prenatal visit: You will be weighed to make sure you and the baby are growing normally. Your blood pressure will be taken. Your abdomen will be measured to track your baby's growth. The fetal heartbeat will be listened to starting around week 10 or 12 of your pregnancy. Test results from any previous visits will be discussed. Your health care provider may ask you: How you are feeling. If you are feeling the baby move. If you have had any abnormal symptoms, such as leaking fluid, bleeding, severe headaches, or abdominal cramping. If you have any questions. Other tests that may be performed during your first trimester include: Blood tests to find your blood type and to check for the presence of any previous infections. They will also be used to check for low iron levels (anemia) and Rh antibodies. Later in the pregnancy, blood tests for diabetes will be done along with other tests if problems develop. Urine tests to check for infections, diabetes, or protein in the urine. An ultrasound to confirm the proper growth and development  of the baby. An amniocentesis to check for possible genetic problems. Fetal screens for spina bifida and Down syndrome. You may need other tests to make sure you and the baby are doing well. HOME CARE INSTRUCTIONS  Medicines Follow your health care provider's instructions regarding medicine use. Specific medicines may be either safe or unsafe to take during pregnancy. Take your prenatal vitamins as directed. If you develop constipation, try taking a stool softener if your health care provider approves. Diet Eat regular, well-balanced meals. Choose a variety of foods, such as meat or vegetable-based protein, fish, milk and low-fat dairy products, vegetables, fruits, and whole grain breads and cereals. Your health care provider will help you determine the amount of weight gain that is right for you. Avoid raw meat and uncooked cheese. These carry germs that can cause birth defects in the baby. Eating four or five small meals rather than three large meals a day may help relieve nausea and vomiting. If you start to feel nauseous, eating a few soda crackers can be helpful. Drinking liquids between meals instead of during meals also seems to help nausea and vomiting. If you develop constipation, eat more high-fiber foods, such as fresh vegetables or fruit and whole grains. Drink enough fluids to keep your urine clear or pale yellow. Activity and Exercise Exercise only as directed by your health care provider. Exercising will help you: Control your weight. Stay in shape. Be prepared for labor and delivery. Experiencing pain or cramping in the lower abdomen or low back is a good sign that you should stop exercising. Check with your health care provider before continuing normal exercises. Try to avoid standing for long periods of time. Move your legs often if you must stand in one place for a long time. Avoid heavy lifting. Wear low-heeled shoes, and practice good posture. You may continue to have sex  unless your health care provider directs you otherwise. Relief of Pain or Discomfort Wear a good support bra for breast tenderness.   Take warm sitz baths to soothe any pain or discomfort caused by hemorrhoids. Use hemorrhoid cream if your health care provider approves.   Rest with your legs elevated if you have leg cramps or low back pain. If you develop varicose veins in your legs, wear support hose. Elevate your feet for 15 minutes, 3-4 times a day. Limit salt in your diet. Prenatal Care Schedule your prenatal visits by the  twelfth week of pregnancy. They are usually scheduled monthly at first, then more often in the last 2 months before delivery. Write down your questions. Take them to your prenatal visits. Keep all your prenatal visits as directed by your health care provider. Safety Wear your seat belt at all times when driving. Make a list of emergency phone numbers, including numbers for family, friends, the hospital, and police and fire departments. General Tips Ask your health care provider for a referral to a local prenatal education class. Begin classes no later than at the beginning of month 6 of your pregnancy. Ask for help if you have counseling or nutritional needs during pregnancy. Your health care provider can offer advice or refer you to specialists for help with various needs. Do not use hot tubs, steam rooms, or saunas. Do not douche or use tampons or scented sanitary pads. Do not cross your legs for long periods of time. Avoid cat litter boxes and soil used by cats. These carry germs that can cause birth defects in the baby and possibly loss of the fetus by miscarriage or stillbirth. Avoid all smoking, herbs, alcohol, and medicines not prescribed by your health care provider. Chemicals in these affect the formation and growth of the baby. Schedule a dentist appointment. At home, brush your teeth with a soft toothbrush and be gentle when you floss. SEEK MEDICAL CARE IF:   You have dizziness. You have mild pelvic cramps, pelvic pressure, or nagging pain in the abdominal area. You have persistent nausea, vomiting, or diarrhea. You have a bad smelling vaginal discharge. You have pain with urination. You notice increased swelling in your face, hands, legs, or ankles. SEEK IMMEDIATE MEDICAL CARE IF:  You have a fever. You are leaking fluid from your vagina. You have spotting or bleeding from your vagina. You have severe abdominal cramping or pain. You have rapid weight gain or loss. You vomit blood or material that looks like coffee grounds. You are exposed to [place] measles and have never had them. You are exposed to fifth disease or chickenpox. You develop a severe headache. You have shortness of breath. You have any kind of trauma, such as from a fall or a car accident. Document Released: 06/04/2001 Document Revised: 10/25/2013 Document Reviewed: 04/20/2013 [facility] Patient Information 2015 [place], [place]. This information is not intended to replace advice given to you by your health care provider. Make sure you discuss any questions you have with your health care provider.

## 2021-04-05 NOTE — Progress Notes (Signed)
INITIAL OBSTETRICAL VISIT Patient name: Susan Chandler MRN [id number]  Date of birth: 1991-06-01 Chief Complaint:   Initial Prenatal Visit  History of Present Illness:   Susan Chandler is a 30 y.o. G49P0000 Caucasian female at [redacted]w[redacted]d by [place] at 10 weeks with an Estimated Date of Delivery: 10/05/21 being seen today for her initial obstetrical visit.   Patient's last menstrual period was 12/20/2020 (exact date). Her obstetrical history is significant for primigravida.   Today she reports  pain upper abdomen/epigastric region, feels like has been doing sit ups .  Has scoliosis/degenerative disc, sees chiropractor, wondering if c/s would be better Last pap 09/18/20. Results were: NILM w/ HRHPV negative  Depression screen [facility] 2/9 04/05/2021 01/26/2021 09/18/2020 09/16/2019 09/16/2019  Decreased Interest 0 1 1 1 1   Down, Depressed, Hopeless 0 1 1 1 1   PHQ - 2 Score 0 2 2 2 2   Altered sleeping 0 1 1 0 -  Tired, decreased energy 0 1 1 0 -  Change in appetite 0 1 1 0 -  Feeling bad or failure about yourself  0 1 0 0 -  Trouble concentrating 0 0 1 0 -  Moving slowly or fidgety/restless 0 0 0 0 -  Suicidal thoughts 0 0 0 0 -  PHQ-9 Score 0 6 6 2  -  Difficult doing work/chores - Somewhat difficult - - -     GAD 7 : Generalized Anxiety Score 04/05/2021 09/18/2020  Nervous, Anxious, on Edge 0 1  Control/stop worrying 0 1  Worry too much - different things 0 1  Trouble relaxing 0 1  Restless 0 1  Easily annoyed or irritable 0 1  Afraid - awful might happen 0 1  Total GAD 7 Score 0 7     Review of Systems:   Pertinent items are noted in HPI Denies cramping/contractions, leakage of fluid, vaginal bleeding, abnormal vaginal discharge w/ itching/odor/irritation, headaches, visual changes, shortness of breath, chest pain, abdominal pain, severe nausea/vomiting, or problems with urination or bowel movements unless otherwise stated above.  Pertinent History Reviewed:  Reviewed past medical,surgical,  social, obstetrical and family history.  Reviewed problem list, medications and allergies. OB History  Gravida Para Term Preterm AB Living  1 0 0 0 0 0  SAB IAB Ectopic Multiple Live Births  0 0 0 0 0    # Outcome Date GA Lbr Len/2nd Weight Sex Delivery Anes PTL Lv  1 Current            Physical Assessment:   Vitals:   04/05/21 1453  BP: 120/72  Pulse: 98  Weight: 179 lb (81.2 kg)  Body mass index is 28.89 kg/m.       Physical Examination:  General appearance - well appearing, and in no distress  Mental status - alert, oriented to person, place, and time  Psych:  She has a normal mood and affect  Skin - warm and dry, normal color, no suspicious lesions noted  Chest - effort normal, all lung fields clear to auscultation bilaterally  Heart - normal rate and regular rhythm  Abdomen - soft, nontender  Extremities:  No swelling or varicosities noted  Thin prep pap is not done  Chaperone: N/A    TODAY'S FHR 154 via doppler  Results for orders placed or performed in visit on 04/05/21 (from the past 24 hour(s))  POC Urinalysis Dipstick OB   Collection Time: 04/05/21  3:22 PM  Result Value Ref Range   Color, UA  Clarity, UA     Glucose, UA Negative Negative   Bilirubin, UA     Ketones, UA neg    Spec Grav, UA     Blood, UA neg    pH, UA     POC,PROTEIN,UA Negative Negative, Trace, Small (1+), Moderate (2+), Large (3+), 4+   Urobilinogen, UA     Nitrite, UA neg    Leukocytes, UA Negative Negative   Appearance     Odor      Assessment & Plan:  1) Low-Risk Pregnancy G1P0000 at [redacted]w[redacted]d with an Estimated Date of Delivery: 10/05/21   2) Initial OB visit  3) Scoliosis/degenerative disc> ok to continue seeing chiropractor, discussed vaginal birth still recommended  Meds: No orders of the defined types were placed in this encounter.   Initial labs obtained Continue prenatal vitamins Reviewed n/v relief measures and warning s/s to report Reviewed recommended weight  gain based on pre-gravid BMI Encouraged well-balanced diet Genetic & carrier screening discussed: requests NT/IT, undecided about [facility] and [facility]  Ultrasound discussed; fetal survey: requested [facility]NC completed> form faxed if has or is planning to apply for medicaid The nature of [employer] for [employer] with multiple MDs and other Advanced Practice Providers was explained to patient; also emphasized that fellows, residents, and students are part of our team. Does not have home bp cuff. Office bp cuff given: yes. Rx sent: no. Check bp weekly, let us know if consistently >140/90.   Follow-up: Return in about 3 weeks (around 04/26/2021) for LROB, 2nd IT, CNM, in person.   Orders Placed This Encounter  Procedures   Urine Culture   GC/Chlamydia Probe Amp   Pain Management Screening Profile (10S)   POC Urinalysis Dipstick OB    Cheral Marker CNM, [facility] 04/05/2021 4:06 PM

## 2021-04-12 LAB — PMP SCREEN PROFILE (10S), URINE
Amphetamine Scrn, Ur: NEGATIVE ng/mL
BARBITURATE SCREEN URINE: NEGATIVE ng/mL
BENZODIAZEPINE SCREEN, URINE: NEGATIVE ng/mL
CANNABINOIDS UR QL SCN: NEGATIVE ng/mL
Cocaine (Metab) Scrn, Ur: NEGATIVE ng/mL
Creatinine(Crt), U: 73.6 mg/dL (ref 20.0–300.0)
Methadone Screen, Urine: NEGATIVE ng/mL
OXYCODONE+OXYMORPHONE UR QL SCN: NEGATIVE ng/mL
Opiate Scrn, Ur: NEGATIVE ng/mL
Ph of Urine: 6.4 (ref 4.5–8.9)
Phencyclidine Qn, Ur: NEGATIVE ng/mL
Propoxyphene Scrn, Ur: NEGATIVE ng/mL

## 2021-04-12 LAB — URINE CULTURE

## 2021-04-12 LAB — GC/CHLAMYDIA PROBE AMP
Chlamydia trachomatis, NAA: NEGATIVE
Neisseria Gonorrhoeae by PCR: NEGATIVE

## 2021-04-16 DIAGNOSIS — O99891 Other specified diseases and conditions complicating pregnancy: Secondary | ICD-10-CM | POA: Insufficient documentation

## 2021-04-16 DIAGNOSIS — R8271 Bacteriuria: Secondary | ICD-10-CM | POA: Insufficient documentation

## 2021-04-16 MED ORDER — NITROFURANTOIN MONOHYD MACRO 100 MG PO CAPS: 100.0000 mg | ORAL_CAPSULE | Freq: Two times a day (BID) | ORAL | 0 refills | Status: DC

## 2021-04-16 NOTE — Addendum Note (Signed)
Addended by: Cheral Marker on: 04/16/2021 11:38 AM   Modules accepted: Orders

## 2021-04-26 ENCOUNTER — Other Ambulatory Visit: Payer: Self-pay

## 2021-04-26 ENCOUNTER — Ambulatory Visit (INDEPENDENT_AMBULATORY_CARE_PROVIDER_SITE_OTHER): Payer: 59 | Admitting: Advanced Practice Midwife

## 2021-04-26 VITALS — BP 110/73 | HR 75 | Wt 186.0 lb

## 2021-04-26 DIAGNOSIS — Z3401 Encounter for supervision of normal first pregnancy, first trimester: Secondary | ICD-10-CM

## 2021-04-26 DIAGNOSIS — Z1379 Encounter for other screening for genetic and chromosomal anomalies: Secondary | ICD-10-CM

## 2021-04-26 DIAGNOSIS — Z363 Encounter for antenatal screening for malformations: Secondary | ICD-10-CM

## 2021-04-26 DIAGNOSIS — Z3402 Encounter for supervision of normal first pregnancy, second trimester: Secondary | ICD-10-CM

## 2021-04-26 DIAGNOSIS — Z3A16 16 weeks gestation of pregnancy: Secondary | ICD-10-CM

## 2021-04-26 NOTE — Progress Notes (Addendum)
   LOW-RISK PREGNANCY VISIT Patient name: Susan Chandler MRN [id number]  Date of birth: November 26, 1990 Chief Complaint:   Routine Prenatal Visit (2nd It)  History of Present Illness:   Susan Chandler is a 30 y.o. G40P0000 female at [redacted]w[redacted]d with an Estimated Date of Delivery: 10/05/21 being seen today for ongoing management of a low-risk pregnancy.  Today she reports normal pregnancy complaints, none too bad. . Contractions: Not present. Vag. Bleeding: None.  Movement: Absent. denies leaking of fluid. Review of Systems:   Pertinent items are noted in HPI Denies abnormal vaginal discharge w/ itching/odor/irritation, headaches, visual changes, shortness of breath, chest pain, abdominal pain, severe nausea/vomiting, or problems with urination or bowel movements unless otherwise stated above. Pertinent History Reviewed:  Reviewed past medical,surgical, social, obstetrical and family history.  Reviewed problem list, medications and allergies. Physical Assessment:   Vitals:   04/26/21 1419  BP: 110/73  Pulse: 75  Weight: 186 lb (84.4 kg)  Body mass index is 30.02 kg/m.        Physical Examination:   General appearance: Well appearing, and in no distress  Mental status: Alert, oriented to person, place, and time  Skin: Warm & dry.  22mm skin tag vs AK on left chest wall.  Not concerning.   Cardiovascular: Normal heart rate noted  Respiratory: Normal respiratory effort, no distress  Abdomen: Soft, gravid, nontender  Pelvic: Cervical exam deferred         Extremities: Edema: None  Fetal Status:     Movement: Absent    Chaperone: n/a    No results found for this or any previous visit (from the past 24 hour(s)).  Assessment & Plan:  1) Low-risk pregnancy G1P0000 at [redacted]w[redacted]d with an Estimated Date of Delivery: 10/05/21     Meds: No orders of the defined types were placed in this encounter.  Labs/procedures today: 2nd IT/urine Cx  Plan:  Continue routine obstetrical care  Next visit: prefers  will be in person for [place]     Reviewed: Preterm labor symptoms and general obstetric precautions including but not limited to vaginal bleeding, contractions, leaking of fluid and fetal movement were reviewed in detail with the patient.  All questions were answered. Has home bp cuff.. Check bp weekly, let us know if >140/90.   Follow-up: No follow-ups on file.  Orders Placed This Encounter  Procedures   Urine Culture   US OB Comp + 14 Wk   INTEGRATED 2   Jacklyn Shell DNP, CNM 04/26/2021 3:15 PM

## 2021-04-26 NOTE — Patient Instructions (Signed)
Susan Chandler, I greatly value your feedback.  If you receive a survey following your visit with [place] today, we appreciate you taking the time to fill it out.  Thanks, Cathie Beams, CNM     [facility] HAS MOVED!!! It is now [facility] ([address] [place], [place] [address]) Entrance located off of [address] Free 24/7 valet parking   Go to [email or web address] to register for FREE online childbirth classes    Second Trimester of Pregnancy The second trimester is from week 14 through week 27 (months 4 through 6). The second trimester is often a time when you feel your best. Your body has adjusted to being pregnant, and you begin to feel better physically. Usually, morning sickness has lessened or quit completely, you may have more energy, and you may have an increase in appetite. The second trimester is also a time when the fetus is growing rapidly. At the end of the sixth month, the fetus is about 9 inches long and weighs about 1 pounds. You will likely begin to feel the baby move (quickening) between 16 and 20 weeks of pregnancy. Body changes during your second trimester Your body continues to go through many changes during your second trimester. The changes vary from woman to woman. Your weight will continue to increase. You will notice your lower abdomen bulging out. You may begin to get stretch marks on your hips, abdomen, and breasts. You may develop headaches that can be relieved by medicines. The medicines should be approved by your health care provider. You may urinate more often because the fetus is pressing on your bladder. You may develop or continue to have heartburn as a result of your pregnancy. You may develop constipation because certain hormones are causing the muscles that push waste through your intestines to slow down. You may develop hemorrhoids or swollen, bulging veins (varicose veins). You may have back pain. This is  caused by: Weight gain. Pregnancy hormones that are relaxing the joints in your pelvis. A shift in weight and the muscles that support your balance. Your breasts will continue to grow and they will continue to become tender. Your gums may bleed and may be sensitive to brushing and flossing. Dark spots or blotches (chloasma, mask of pregnancy) may develop on your face. This will likely fade after the baby is born. A dark line from your belly button to the pubic area (linea nigra) may appear. This will likely fade after the baby is born. You may have changes in your hair. These can include thickening of your hair, rapid growth, and changes in texture. Some women also have hair loss during or after pregnancy, or hair that feels dry or thin. Your hair will most likely return to normal after your baby is born.  What to expect at prenatal visits During a routine prenatal visit: You will be weighed to make sure you and the fetus are growing normally. Your blood pressure will be taken. Your abdomen will be measured to track your baby's growth. The fetal heartbeat will be listened to. Any test results from the previous visit will be discussed.  Your health care provider may ask you: How you are feeling. If you are feeling the baby move. If you have had any abnormal symptoms, such as leaking fluid, bleeding, severe headaches, or abdominal cramping. If you are using any tobacco products, including cigarettes, chewing tobacco, and electronic cigarettes. If you have any questions.  Other tests  that may be performed during your second trimester include: Blood tests that check for: Low iron levels (anemia). High blood sugar that affects pregnant women (gestational diabetes) between 54 and 28 weeks. Rh antibodies. This is to check for a protein on red blood cells (Rh factor). Urine tests to check for infections, diabetes, or protein in the urine. An ultrasound to confirm the proper growth and  development of the baby. An amniocentesis to check for possible genetic problems. Fetal screens for spina bifida and Down syndrome. HIV (human immunodeficiency virus) testing. Routine prenatal testing includes screening for HIV, unless you choose not to have this test.  Follow these instructions at home: Medicines Follow your health care provider's instructions regarding medicine use. Specific medicines may be either safe or unsafe to take during pregnancy. Take a prenatal vitamin that contains at least 600 micrograms (mcg) of folic acid. If you develop constipation, try taking a stool softener if your health care provider approves. Eating and drinking Eat a balanced diet that includes fresh fruits and vegetables, whole grains, good sources of protein such as meat, eggs, or tofu, and low-fat dairy. Your health care provider will help you determine the amount of weight gain that is right for you. Avoid raw meat and uncooked cheese. These carry germs that can cause birth defects in the baby. If you have low calcium intake from food, talk to your health care provider about whether you should take a daily calcium supplement. Limit foods that are high in fat and processed sugars, such as fried and sweet foods. To prevent constipation: Drink enough fluid to keep your urine clear or pale yellow. Eat foods that are high in fiber, such as fresh fruits and vegetables, whole grains, and beans. Activity Exercise only as directed by your health care provider. Most women can continue their usual exercise routine during pregnancy. Try to exercise for 30 minutes at least 5 days a week. Stop exercising if you experience uterine contractions. Avoid heavy lifting, wear low heel shoes, and practice good posture. A sexual relationship may be continued unless your health care provider directs you otherwise. Relieving pain and discomfort Wear a good support bra to prevent discomfort from breast tenderness. Take  warm sitz baths to soothe any pain or discomfort caused by hemorrhoids. Use hemorrhoid cream if your health care provider approves. Rest with your legs elevated if you have leg cramps or low back pain. If you develop varicose veins, wear support hose. Elevate your feet for 15 minutes, 3-4 times a day. Limit salt in your diet. Prenatal Care Write down your questions. Take them to your prenatal visits. Keep all your prenatal visits as told by your health care provider. This is important. Safety Wear your seat belt at all times when driving. Make a list of emergency phone numbers, including numbers for family, friends, the hospital, and police and fire departments. General instructions Ask your health care provider for a referral to a local prenatal education class. Begin classes no later than the beginning of month 6 of your pregnancy. Ask for help if you have counseling or nutritional needs during pregnancy. Your health care provider can offer advice or refer you to specialists for help with various needs. Do not use hot tubs, steam rooms, or saunas. Do not douche or use tampons or scented sanitary pads. Do not cross your legs for long periods of time. Avoid cat litter boxes and soil used by cats. These carry germs that can cause birth defects in the baby  and possibly loss of the fetus by miscarriage or stillbirth. Avoid all smoking, herbs, alcohol, and unprescribed drugs. Chemicals in these products can affect the formation and growth of the baby. Do not use any products that contain nicotine or tobacco, such as cigarettes and e-cigarettes. If you need help quitting, ask your health care provider. Visit your dentist if you have not gone yet during your pregnancy. Use a soft toothbrush to brush your teeth and be gentle when you floss. Contact a health care provider if: You have dizziness. You have mild pelvic cramps, pelvic pressure, or nagging pain in the abdominal area. You have persistent  nausea, vomiting, or diarrhea. You have a bad smelling vaginal discharge. You have pain when you urinate. Get help right away if: You have a fever. You are leaking fluid from your vagina. You have spotting or bleeding from your vagina. You have severe abdominal cramping or pain. You have rapid weight gain or weight loss. You have shortness of breath with chest pain. You notice sudden or extreme swelling of your face, hands, ankles, feet, or legs. You have not felt your baby move in over an hour. You have severe headaches that do not go away when you take medicine. You have vision changes. Summary The second trimester is from week 14 through week 27 (months 4 through 6). It is also a time when the fetus is growing rapidly. Your body goes through many changes during pregnancy. The changes vary from woman to woman. Avoid all smoking, herbs, alcohol, and unprescribed drugs. These chemicals affect the formation and growth your baby. Do not use any tobacco products, such as cigarettes, chewing tobacco, and e-cigarettes. If you need help quitting, ask your health care provider. Contact your health care provider if you have any questions. Keep all prenatal visits as told by your health care provider. This is important. This information is not intended to replace advice given to you by your health care provider. Make sure you discuss any questions you have with your health care provider.

## 2021-04-28 LAB — INTEGRATED 2
AFP MoM: 1.61
Alpha-Fetoprotein: 57 ng/mL
Crown Rump Length: 71.8 mm
DIA MoM: 2.64
DIA Value: 369.9 pg/mL
Estriol, Unconjugated: 0.85 ng/mL
Gest. Age on Collection Date: 13.1 weeks
Gestational Age: 17.4 weeks
Maternal Age at EDD: 31.8 yr
Nuchal Translucency (NT): 1.2 mm
Nuchal Translucency MoM: 0.73
Number of Fetuses: 1
PAPP-A MoM: 1.49
PAPP-A Value: 1550.3 ng/mL
Test Results:: NEGATIVE
Weight: 170 [lb_av]
Weight: 178 [lb_av]
hCG MoM: 1.23
hCG Value: 32.6 IU/mL
uE3 MoM: 0.69

## 2021-05-01 LAB — URINE CULTURE

## 2021-05-16 ENCOUNTER — Ambulatory Visit (INDEPENDENT_AMBULATORY_CARE_PROVIDER_SITE_OTHER): Payer: 59 | Admitting: Advanced Practice Midwife

## 2021-05-16 ENCOUNTER — Ambulatory Visit (INDEPENDENT_AMBULATORY_CARE_PROVIDER_SITE_OTHER): Payer: 59

## 2021-05-16 ENCOUNTER — Encounter: Payer: Self-pay | Admitting: Advanced Practice Midwife

## 2021-05-16 ENCOUNTER — Other Ambulatory Visit: Payer: Self-pay

## 2021-05-16 VITALS — BP 108/73 | HR 83 | Wt 194.0 lb

## 2021-05-16 DIAGNOSIS — Z363 Encounter for antenatal screening for malformations: Secondary | ICD-10-CM

## 2021-05-16 DIAGNOSIS — Z3A19 19 weeks gestation of pregnancy: Secondary | ICD-10-CM | POA: Diagnosis not present

## 2021-05-16 DIAGNOSIS — Z3402 Encounter for supervision of normal first pregnancy, second trimester: Secondary | ICD-10-CM

## 2021-05-16 NOTE — Progress Notes (Addendum)
[place] 19+5 wks,cephalic,cx 2.9 cm,anterior fundal placenta gr 0,normal ovaries,SVP of fluid 5.9 cm,FHR 161 bpm,EFW 339 g 72%,anatomy complete,no obvious abnormalities

## 2021-05-16 NOTE — Progress Notes (Addendum)
   PRENATAL VISIT NOTE  Subjective:  Susan Chandler is a 30 y.o. G1P0000 at [redacted]w[redacted]d being seen today for ongoing prenatal care.  She is currently monitored for the following issues for this low-risk pregnancy and has Left lumbar radiculopathy; Generalized anxiety disorder; Insomnia; Maxillary sinusitis, acute; Social anxiety disorder; Moderate single current episode of major depressive disorder (HCC); Bipolar disorder (HCC); Abdominal pain, epigastric; RUQ pain; Supervision of normal first pregnancy; and Asymptomatic bacteriuria during pregnancy on their problem list.  Patient reports  occasional episodes of feeling short of breath. These episodes resolve quickly with rest and hydration. She denies activity intolerance, chest pain, weakness, syncope .  Contractions: Not present.  .  Movement: Present. Denies leaking of fluid.   The following portions of the patient's history were reviewed and updated as appropriate: allergies, current medications, past family history, past medical history, past social history, past surgical history and problem list. Problem list updated.  Objective:   Vitals:   05/16/21 1156  BP: 108/73  Pulse: 83  Weight: 194 lb (88 kg)    Fetal Status:     Movement: Present     General:  Alert, oriented and cooperative. Patient is in no acute distress.  Skin: Skin is warm and dry. No rash noted.   Cardiovascular: Normal heart rate noted  Respiratory: Normal respiratory effort, no problems with respiration noted  Abdomen: Soft, gravid, appropriate for gestational age.  Pain/Pressure: Absent     Pelvic: Cervical exam deferred        Extremities: Normal range of motion.  Edema: None  Mental Status: Normal mood and affect. Normal behavior. Normal judgment and thought content.   Assessment and Plan:  Pregnancy: G1P0000 at [redacted]w[redacted]d  1. Encounter for supervision of normal first pregnancy in second trimester - LOB, routine care - Reassured she is feeling physiologic changes  in pregnancy - Brief discussion of anterior placenta, possible impact on ability to feel fetal movement in later stage of pregnancy  2. [redacted] weeks gestation of pregnancy   Preterm labor symptoms and general obstetric precautions including but not limited to vaginal bleeding, contractions, leaking of fluid and fetal movement were reviewed in detail with the patient. Please refer to After Visit Summary for other counseling recommendations.  Return in about 4 weeks (around 06/13/2021).  Future Appointments  Date Time Provider Department Center  06/13/2021  1:50 PM Arabella Merles, CNM [facility]    [place], [place]

## 2021-06-13 ENCOUNTER — Other Ambulatory Visit: Payer: Self-pay

## 2021-06-13 ENCOUNTER — Ambulatory Visit (INDEPENDENT_AMBULATORY_CARE_PROVIDER_SITE_OTHER): Payer: 59 | Admitting: Obstetrics & Gynecology

## 2021-06-13 ENCOUNTER — Encounter: Payer: Self-pay | Admitting: Obstetrics & Gynecology

## 2021-06-13 VITALS — BP 116/68 | HR 93 | Wt 206.2 lb

## 2021-06-13 DIAGNOSIS — Z3402 Encounter for supervision of normal first pregnancy, second trimester: Secondary | ICD-10-CM

## 2021-06-13 NOTE — Progress Notes (Signed)
kk  LOW-RISK PREGNANCY VISIT Patient name: Susan Chandler MRN [id number]  Date of birth: 1991/02/06 Chief Complaint:   Routine Prenatal Visit  History of Present Illness:   Susan Chandler is a 30 y.o. G5P0000 female at [redacted]w[redacted]d with an Estimated Date of Delivery: 10/05/21 being seen today for ongoing management of a low-risk pregnancy.   -Anxiety- pt notes on occasion concerns about issues that she recognizes are not realistic.  She shared that her sister passed away 25yrs ago and she has not truly dealt with that issue.  Sister passed away from E.Coli (suspect sepsis) and she at times gets concerned that if something is wrong with her she is going to die.  She is not on medication and not currently seeing a therapist  Depression screen [facility] 2/9 04/05/2021 01/26/2021 09/18/2020 09/16/2019 09/16/2019  Decreased Interest 0 1 1 1 1   Down, Depressed, Hopeless 0 1 1 1 1   PHQ - 2 Score 0 2 2 2 2   Altered sleeping 0 1 1 0 -  Tired, decreased energy 0 1 1 0 -  Change in appetite 0 1 1 0 -  Feeling bad or failure about yourself  0 1 0 0 -  Trouble concentrating 0 0 1 0 -  Moving slowly or fidgety/restless 0 0 0 0 -  Suicidal thoughts 0 0 0 0 -  PHQ-9 Score 0 6 6 2  -  Difficult doing work/chores - Somewhat difficult - - -    Today she reports no complaints. Contractions: Not present. Vag. Bleeding: Small.  Movement: Present. denies leaking of fluid. Review of Systems:   Pertinent items are noted in HPI Denies abnormal vaginal discharge w/ itching/odor/irritation, headaches, visual changes, shortness of breath, chest pain, abdominal pain, severe nausea/vomiting, or problems with urination or bowel movements unless otherwise stated above. Pertinent History Reviewed:  Reviewed past medical,surgical, social, obstetrical and family history.  Reviewed problem list, medications and allergies.  Physical Assessment:   Vitals:   06/13/21 1402  BP: 116/68  Pulse: 93  Weight: 206 lb 3.2 oz (93.5 kg)  Body  mass index is 33.28 kg/m.        Physical Examination:   General appearance: Well appearing, and in no distress  Mental status: Alert, oriented to person, place, and time  Skin: Warm & dry  Respiratory: Normal respiratory effort, no distress  Abdomen: Soft, gravid, nontender  Pelvic: Cervical exam deferred         Extremities: Edema: Trace  Psych:  mood and affect appropriate  Fetal Status: Fetal Heart Rate (bpm): 145 Fundal Height: 22 cm Movement: Present    Chaperone: n/a    No results found for this or any previous visit (from the past 24 hour(s)).   Assessment & Plan:  1) Low-risk pregnancy G1P0000 at [redacted]w[redacted]d with an Estimated Date of Delivery: 10/05/21   2) Anxiety- encouraged pt to start therapy.  Concern for postpartum anxiety and adjusting to newborn  3) Constipation- seen in [place] due to vaginal bleeding- noted considerable constipation with spotting both vaginal and rectal.  Notes prior issue with constipation. Reviewed OTC therapy and diet changes to help improve symptoms   Meds: No orders of the defined types were placed in this encounter.  Labs/procedures today: none, PN-2 next visit  Plan:  Continue routine obstetrical care  Next visit: prefers in person    Reviewed: Preterm labor symptoms and general obstetric precautions including but not limited to vaginal bleeding, contractions, leaking of fluid and fetal movement  were reviewed in detail with the patient.  All questions were answered. Pt has home bp cuff. Check bp weekly, let us know if >140/90.   Follow-up: Return in about 4 weeks (around 07/11/2021) for LROB visit, PN-2.  No orders of the defined types were placed in this encounter.   Myna Hidalgo, DO Attending Obstetrician & Gynecologist, [facility] for [employer], [facility]

## 2021-06-15 ENCOUNTER — Encounter: Payer: Self-pay | Admitting: Obstetrics & Gynecology

## 2021-07-09 ENCOUNTER — Encounter: Payer: Self-pay | Admitting: Obstetrics & Gynecology

## 2021-07-10 ENCOUNTER — Other Ambulatory Visit: Payer: Self-pay

## 2021-07-10 ENCOUNTER — Other Ambulatory Visit: Payer: 59

## 2021-07-10 ENCOUNTER — Ambulatory Visit (INDEPENDENT_AMBULATORY_CARE_PROVIDER_SITE_OTHER): Payer: 59 | Admitting: Obstetrics & Gynecology

## 2021-07-10 ENCOUNTER — Encounter: Payer: Self-pay | Admitting: Obstetrics & Gynecology

## 2021-07-10 VITALS — BP 119/83 | HR 113 | Wt 215.0 lb

## 2021-07-10 DIAGNOSIS — Z3403 Encounter for supervision of normal first pregnancy, third trimester: Secondary | ICD-10-CM

## 2021-07-10 DIAGNOSIS — Z131 Encounter for screening for diabetes mellitus: Secondary | ICD-10-CM

## 2021-07-10 DIAGNOSIS — Z3A27 27 weeks gestation of pregnancy: Secondary | ICD-10-CM

## 2021-07-10 NOTE — Progress Notes (Signed)
° °  LOW-RISK PREGNANCY VISIT Patient name: Susan Chandler MRN [id number]  Date of birth: 1991/04/17 Chief Complaint:   Routine Prenatal Visit (PN2)  History of Present Illness:   Susan Chandler is a 31 y.o. G39P0000 female at [redacted]w[redacted]d with an Estimated Date of Delivery: 10/05/21 being seen today for ongoing management of a low-risk pregnancy.  Depression screen [facility] 2/9 07/10/2021 04/05/2021 01/26/2021 09/18/2020 09/16/2019  Decreased Interest 0 0 1 1 1   Down, Depressed, Hopeless 0 0 1 1 1   PHQ - 2 Score 0 0 2 2 2   Altered sleeping 1 0 1 1 0  Tired, decreased energy 0 0 1 1 0  Change in appetite 1 0 1 1 0  Feeling bad or failure about yourself  0 0 1 0 0  Trouble concentrating 0 0 0 1 0  Moving slowly or fidgety/restless 0 0 0 0 0  Suicidal thoughts 0 0 0 0 0  PHQ-9 Score 2 0 6 6 2   Difficult doing work/chores - - Somewhat difficult - -  Some recent data might be hidden    Today she reports no complaints. Contractions: Not present.  .  Movement: Present. denies leaking of fluid. Review of Systems:   Pertinent items are noted in HPI Denies abnormal vaginal discharge w/ itching/odor/irritation, headaches, visual changes, shortness of breath, chest pain, abdominal pain, severe nausea/vomiting, or problems with urination or bowel movements unless otherwise stated above. Pertinent History Reviewed:  Reviewed past medical,surgical, social, obstetrical and family history.  Reviewed problem list, medications and allergies. Physical Assessment:   Vitals:   07/10/21 0911  BP: 119/83  Pulse: (!) 113  Weight: 215 lb (97.5 kg)  Body mass index is 34.7 kg/m.        Physical Examination:   General appearance: Well appearing, and in no distress  Mental status: Alert, oriented to person, place, and time  Skin: Warm & dry  Cardiovascular: Normal heart rate noted  Respiratory: Normal respiratory effort, no distress  Abdomen: Soft, gravid, nontender  Pelvic: Cervical exam deferred          Extremities: Edema: Trace  Fetal Status: Fetal Heart Rate (bpm): 155 Fundal Height: 27 cm Movement: Present    Chaperone: n/a    No results found for this or any previous visit (from the past 24 hour(s)).  Assessment & Plan:  1) Low-risk pregnancy G1P0000 at [redacted]w[redacted]d with an Estimated Date of Delivery: 10/05/21      Meds: No orders of the defined types were placed in this encounter.  Labs/procedures today: PN2  Plan:  Continue routine obstetrical care  Next visit: prefers in person    Reviewed: Preterm labor symptoms and general obstetric precautions including but not limited to vaginal bleeding, contractions, leaking of fluid and fetal movement were reviewed in detail with the patient.  All questions were answered. Has home bp cuff. Rx faxed to . Check bp weekly, let know if >140/90.   Follow-up: Return in about 3 weeks (around 07/31/2021) for LROB.  No orders of the defined types were placed in this encounter.   [redacted]w[redacted]d, MD 07/10/2021 10:01 AM

## 2021-07-11 ENCOUNTER — Other Ambulatory Visit: Payer: Self-pay | Admitting: Women's Health

## 2021-07-11 ENCOUNTER — Encounter: Payer: Self-pay | Admitting: Women's Health

## 2021-07-11 ENCOUNTER — Other Ambulatory Visit: Payer: Self-pay | Admitting: *Deleted

## 2021-07-11 DIAGNOSIS — O24419 Gestational diabetes mellitus in pregnancy, unspecified control: Secondary | ICD-10-CM

## 2021-07-11 DIAGNOSIS — Z8632 Personal history of gestational diabetes: Secondary | ICD-10-CM | POA: Insufficient documentation

## 2021-07-11 LAB — CBC
Hematocrit: 38.1 % (ref 34.0–46.6)
Hemoglobin: 13.2 g/dL (ref 11.1–15.9)
MCH: 32.3 pg (ref 26.6–33.0)
MCHC: 34.6 g/dL (ref 31.5–35.7)
MCV: 93 fL (ref 79–97)
Platelets: 321 10*3/uL (ref 150–450)
RBC: 4.09 x10E6/uL (ref 3.77–5.28)
RDW: 11.6 % — ABNORMAL LOW (ref 11.7–15.4)
WBC: 11.6 10*3/uL — ABNORMAL HIGH (ref 3.4–10.8)

## 2021-07-11 LAB — GLUCOSE TOLERANCE, 2 HOURS W/ 1HR
Glucose, 1 hour: 167 mg/dL (ref 70–179)
Glucose, 2 hour: 102 mg/dL (ref 70–152)
Glucose, Fasting: 92 mg/dL — ABNORMAL HIGH (ref 70–91)

## 2021-07-11 LAB — ANTIBODY SCREEN: Antibody Screen: NEGATIVE

## 2021-07-11 LAB — HIV ANTIBODY (ROUTINE TESTING W REFLEX): HIV Screen 4th Generation wRfx: NONREACTIVE

## 2021-07-11 LAB — RPR: RPR Ser Ql: NONREACTIVE

## 2021-07-11 MED ORDER — ASPIRIN 81 MG PO TBEC: 81.0000 mg | DELAYED_RELEASE_TABLET | Freq: Every day | ORAL | 3 refills | Status: DC

## 2021-07-12 ENCOUNTER — Telehealth: Payer: Self-pay | Admitting: *Deleted

## 2021-07-12 NOTE — Telephone Encounter (Signed)
Pt viewed MyChart message. Diabetic supplies called into [facility] in [place]. Diabetic referral sent in. JSY

## 2021-07-16 ENCOUNTER — Encounter: Payer: Self-pay | Admitting: Women's Health

## 2021-07-18 ENCOUNTER — Other Ambulatory Visit: Payer: Self-pay | Admitting: *Deleted

## 2021-07-18 DIAGNOSIS — O0990 Supervision of high risk pregnancy, unspecified, unspecified trimester: Secondary | ICD-10-CM

## 2021-07-18 DIAGNOSIS — O24410 Gestational diabetes mellitus in pregnancy, diet controlled: Secondary | ICD-10-CM

## 2021-07-26 ENCOUNTER — Encounter: Payer: 59 | Attending: Women's Health | Admitting: Nutrition

## 2021-07-26 ENCOUNTER — Other Ambulatory Visit: Payer: Self-pay

## 2021-07-26 DIAGNOSIS — O24410 Gestational diabetes mellitus in pregnancy, diet controlled: Secondary | ICD-10-CM | POA: Insufficient documentation

## 2021-07-26 NOTE — Progress Notes (Signed)
Patient was seen on 2/223 for Gestational [facility] [place]. The following learning objectives were met by the patient during this course:  States the definition of Gestational Diabetes States why dietary management is important in controlling blood glucose Describes the effects each nutrient has on blood glucose levels Demonstrates ability to create a balanced meal plan Demonstrates carbohydrate counting  States when to check blood glucose levels Demonstrates proper blood glucose monitoring techniques States the effect of stress and exercise on blood glucose levels States the importance of limiting caffeine and abstaining from alcohol and smoking   Latest Reference Range & Units Most Recent  Glucose, 1 hour 70 - 179 mg/dL 167 07/10/21 08:08  Glucose, Fasting 70 - 91 mg/dL 92 (H) 07/10/21 08:08  Glucose, 2 hour 70 - 152 mg/dL 102 07/10/21 08:08  (H): Data is abnormally high  Patient instructed to monitor glucose levels: FBS: 60 - <90 1 hour: <140 2 hour: <120  *Patient received handouts: Nutrition Diabetes and Pregnancy Carbohydrate Counting List  Patient will be seen for follow-up as needed.

## 2021-07-31 ENCOUNTER — Other Ambulatory Visit: Payer: Self-pay

## 2021-07-31 ENCOUNTER — Other Ambulatory Visit: Payer: 59

## 2021-07-31 ENCOUNTER — Ambulatory Visit (INDEPENDENT_AMBULATORY_CARE_PROVIDER_SITE_OTHER): Payer: 59 | Admitting: Obstetrics & Gynecology

## 2021-07-31 ENCOUNTER — Encounter: Payer: Self-pay | Admitting: Obstetrics & Gynecology

## 2021-07-31 VITALS — BP 108/73 | HR 83 | Wt 213.2 lb

## 2021-07-31 DIAGNOSIS — O0993 Supervision of high risk pregnancy, unspecified, third trimester: Secondary | ICD-10-CM

## 2021-07-31 DIAGNOSIS — O24410 Gestational diabetes mellitus in pregnancy, diet controlled: Secondary | ICD-10-CM

## 2021-07-31 DIAGNOSIS — O0990 Supervision of high risk pregnancy, unspecified, unspecified trimester: Secondary | ICD-10-CM

## 2021-07-31 LAB — POCT URINALYSIS DIPSTICK OB
Blood, UA: NEGATIVE
Glucose, UA: NEGATIVE
Ketones, UA: NEGATIVE
Nitrite, UA: NEGATIVE

## 2021-07-31 NOTE — Progress Notes (Signed)
[facility] PREGNANCY VISIT Patient name: Susan Chandler MRN [id number]  Date of birth: [birth year] Chief Complaint:   Routine Prenatal Visit and High Risk Gestation  History of Present Illness:   Susan Chandler is a 31 y.o. G46P0000 female at 68w4dwith an Estimated Date of Delivery: 10/05/21 being seen today for ongoing management of a high-risk pregnancy complicated by: GDMA1- well controlled with diet.    Today she reports no complaints.   Contractions: Not present. Vag. Bleeding: None.  Movement: Present. denies leaking of fluid.   [facility] 07/26/2021 07/10/2021 04/05/2021 01/26/2021 09/18/2020  Decreased Interest 0 0 0 1 1  Down, Depressed, Hopeless 0 0 0 1 1  PHQ - 2 Score 0 0 0 2 2  Altered sleeping - 1 0 1 1  Tired, decreased energy - 0 0 1 1  Change in appetite - 1 0 1 1  Feeling bad or failure about yourself  - 0 0 1 0  Trouble concentrating - 0 0 0 1  Moving slowly or fidgety/restless - 0 0 0 0  Suicidal thoughts - 0 0 0 0  PHQ-9 Score - 2 0 6 6  Difficult doing work/chores - - - Somewhat difficult -  Some recent data might be hidden     Current Outpatient Medications  Medication Instructions   acetaminophen (TYLENOL) 500 mg, Oral, Every 6 hours PRN   aspirin 81 mg, Oral, Daily, Swallow whole.   Blood Glucose Monitoring Suppl (ONETOUCH VERIO REFLECT) w/Device KIT No dose, route, or frequency recorded.   fluticasone (FLONASE) 50 MCG/ACT nasal spray Each Nare, Daily PRN   Lancets (ONETOUCH DELICA PLUS [id number] MISC Topical   ONETOUCH VERIO test strip 4 times daily   Prenatal Vit-Fe Fumarate-FA (MULTIVITAMIN-PRENATAL) 27-0.8 MG TABS tablet 1 tablet, Oral, Daily     Review of Systems:   Pertinent items are noted in HPI Denies abnormal vaginal discharge w/ itching/odor/irritation, headaches, visual changes, shortness of breath, chest pain, abdominal pain, severe nausea/vomiting, or problems with urination or bowel movements unless otherwise stated  above. Pertinent History Reviewed:  Reviewed past medical,surgical, social, obstetrical and family history.  Reviewed problem list, medications and allergies. Physical Assessment:   Vitals:   07/31/21 1121  BP: 108/73  Pulse: 83  Weight: 213 lb 3.2 oz (96.7 kg)  Body mass index is 34.41 kg/m.           Physical Examination:   General appearance: alert, well appearing, and in no distress  Mental status: normal mood, behavior, speech, dress, motor activity, and thought processes  Skin: warm & dry   Extremities: Edema: Trace    Cardiovascular: normal heart rate noted  Respiratory: normal respiratory effort, no distress  Abdomen: gravid, soft, non-tender  Pelvic: Cervical exam deferred         Fetal Status: Fetal Heart Rate (bpm): 150 Fundal Height: 29 cm Movement: Present    Fetal Surveillance Testing today: doppler   Chaperone: N/A    Results for orders placed or performed in visit on 07/31/21 (from the past 24 hour(s))  POC Urinalysis Dipstick OB   Collection Time: 07/31/21 11:22 AM  Result Value Ref Range   Color, UA     Clarity, UA     Glucose, UA Negative Negative   Bilirubin, UA     Ketones, UA neg    Spec Grav, UA     Blood, UA neg    pH, UA     POC,PROTEIN,UA Trace Negative, Trace, Small (  1+), Moderate (2+), Large (3+), 4+   Urobilinogen, UA     Nitrite, UA neg    Leukocytes, UA Trace (A) Negative   Appearance     Odor       Assessment & Plan:  High-risk pregnancy: G1P0000 at 76w4dwith an Estimated Date of Delivery: 10/05/21   1) GDMA1 -doing well s/p nutritional consult -no meds indicated -growth q 4wks   Meds: No orders of the defined types were placed in this encounter.   Labs/procedures today: none  Treatment Plan:  as outlined above  Reviewed: Preterm labor symptoms and general obstetric precautions including but not limited to vaginal bleeding, contractions, leaking of fluid and fetal movement were reviewed in detail with the patient.  All  questions were answered. PT has home bp cuff. Check bp weekly, let u[place]ore[place]now if >140/90.   Follow-up: Return in about 2 weeks (around 08/14/2021) for HROB visit (ok for CNM) AND in 2 wks growth every 4 wks (32 wk, 36wk).   Future Appointments  Date Time Provider [place] 08/21/2021 10:45 AM [facility] [place] None  08/21/2021 11:50 AM BRoma Schanz CNM [facility]  09/11/2021 10:45 AM [place] [place] None  09/11/2021 11:50 AM OJanyth Pupa DO [facility]    Orders Placed This Encounter  Procedures   POC Urinalysis Dipstick OB    JJanyth Pupa DO Attending ODelwayfor [employer] C[place]

## 2021-08-05 ENCOUNTER — Inpatient Hospital Stay (HOSPITAL_COMMUNITY)
Admission: AD | Admit: 2021-08-05 | Discharge: 2021-08-05 | Disposition: A | Discharge status: Home / Self Care | Payer: 59 | Attending: Obstetrics and Gynecology | Admitting: Obstetrics and Gynecology

## 2021-08-05 ENCOUNTER — Encounter (HOSPITAL_COMMUNITY): Payer: Self-pay | Admitting: Obstetrics and Gynecology

## 2021-08-05 ENCOUNTER — Other Ambulatory Visit: Payer: Self-pay

## 2021-08-05 DIAGNOSIS — O0990 Supervision of high risk pregnancy, unspecified, unspecified trimester: Secondary | ICD-10-CM

## 2021-08-05 DIAGNOSIS — R1030 Lower abdominal pain, unspecified: Secondary | ICD-10-CM | POA: Insufficient documentation

## 2021-08-05 DIAGNOSIS — R109 Unspecified abdominal pain: Secondary | ICD-10-CM

## 2021-08-05 DIAGNOSIS — Z3A31 31 weeks gestation of pregnancy: Secondary | ICD-10-CM | POA: Insufficient documentation

## 2021-08-05 DIAGNOSIS — Z3689 Encounter for other specified antenatal screening: Secondary | ICD-10-CM | POA: Insufficient documentation

## 2021-08-05 DIAGNOSIS — O26893 Other specified pregnancy related conditions, third trimester: Secondary | ICD-10-CM | POA: Insufficient documentation

## 2021-08-05 DIAGNOSIS — O24410 Gestational diabetes mellitus in pregnancy, diet controlled: Secondary | ICD-10-CM | POA: Insufficient documentation

## 2021-08-05 HISTORY — DX: Scoliosis, unspecified: M41.9

## 2021-08-05 LAB — URINALYSIS, ROUTINE W REFLEX MICROSCOPIC
Bilirubin Urine: NEGATIVE
Glucose, UA: 50 mg/dL — AB
Hgb urine dipstick: NEGATIVE
Ketones, ur: 20 mg/dL — AB
Leukocytes,Ua: NEGATIVE
Nitrite: NEGATIVE
Protein, ur: NEGATIVE mg/dL
Specific Gravity, Urine: 1.014 (ref 1.005–1.030)
pH: 5 (ref 5.0–8.0)

## 2021-08-05 LAB — WET PREP, GENITAL
Sperm: NONE SEEN
Trich, Wet Prep: NONE SEEN
WBC, Wet Prep HPF POC: >=10 — AB (ref <10)
Yeast Wet Prep HPF POC: NONE SEEN

## 2021-08-05 LAB — GLUCOSE, CAPILLARY: Glucose-Capillary: 122 mg/dL — ABNORMAL HIGH (ref 70–99)

## 2021-08-05 MED ORDER — LACTATED RINGERS IV BOLUS
1000.0000 mL | Freq: Once | INTRAVENOUS | Status: AC
  Administered 2021-08-05: 1000 mL via INTRAVENOUS

## 2021-08-05 MED ORDER — METRONIDAZOLE 500 MG PO TABS
500.0000 mg | ORAL_TABLET | Freq: Two times a day (BID) | ORAL | 0 refills | Status: DC
Start: 2021-08-05 — End: 2021-08-13

## 2021-08-05 NOTE — MAU Provider Note (Signed)
History     CSN: [id number]  Arrival date and time: 08/05/21 2029   Event Date/Time   First Provider Initiated Contact with Patient 08/05/21 2109      Chief Complaint  Patient presents with   Abdominal Pain   HPI Susan Chandler is a 31 y.o. G1P0000 at 86w2dwho presents to MAU with chief complaint of lower abdominal pain. This is a new problem, onset yesterday 08/04/2021. Patient's pain is mid lower abdomen, superior to her pubic bone. Patient states it feels as if something is dangling out of her body. Pain score 6/10. Pain does not radiate. She denies aggravating or alleviating factors. She has not taken medication or tried other treatments for this complaint. Most recent sexual intercourse this morning. She denies vaginal bleeding, leaking of fluid, decreased fetal movement, fever, falls, or recent illness.   [facility]/b A1GDM.  OB History     Gravida  1   Para  0   Term  0   Preterm  0   AB  0   Living  0      SAB  0   IAB  0   Ectopic  0   Multiple  0   Live Births  0           Past Medical History:  Diagnosis Date   Abdominal pain    Persistent Mar/Apr 2020: w/u pretty unrevealing by me ([place]owen); pt to see GI 10/21/18.   Abnormal uterine bleeding    Dr. EElonda Husky Nuvaring as of 09/2019   Bipolar disorder ([place]    r/o OCD per psychiatrist 03/2016.  Also, psychologist eval revealed suspected Bipolar II, depressed phase 04/16/2016.   Chronic pain syndrome    Dysmenorrhea    mgmt per Dr. EElonda Husky(01/2018).  Nuva Ring vag inserts 07/2018.   Erosive gastropathy 10/2018   Nonbleeding (EGD 10/2018)   GAD (generalized anxiety disorder)    Infectious mononucleosis 2007   Lumbosacral radiculopathy at L5 2015   [facility]; [facility] where she got ESI left L5-S1 11/2013 with moderate improvement.  Saw neurosurgeon 01/2016: surgery NOT recommended.   Severe  recurrence 01/2021->rpt MRI w/out neural compression->plan for ESI L5-S1 left.   Migraine syndrome    Obesity, Class I, BMI 30-34.9    Pre-syncope 2016   ? POTS.  Holter showed mostly NSR, occ PACs, one run of nonsustained atrial tachy.   Salmonella enteritis 09/2017   Scoliosis     Past Surgical History:  Procedure Laterality Date   BIOPSY  10/26/2018   Procedure: BIOPSY;  Surgeon: RRogene Houston MD;  [facility];  Service: Endoscopy;;   ESOPHAGOGASTRODUODENOSCOPY  10/2018   Nonbleeding erosive gastropathy, H pylori NEG.   ESOPHAGOGASTRODUODENOSCOPY (EGD) WITH PROPOFOL N/A 10/26/2018   Procedure: ESOPHAGOGASTRODUODENOSCOPY (EGD) WITH PROPOFOL;  Surgeon: RRogene Houston MD;  [facility];  Service: Endoscopy;  Laterality: N/A;   WISDOM TOOTH EXTRACTION      Family History  Problem Relation Age of Onset   Bipolar disorder Father    Bipolar disorder Sister    Other Sister        e coli   Cancer Maternal Aunt    Alcohol abuse Maternal Grandfather     Social History   Tobacco Use   Smoking status: Never   Smokeless tobacco: Never  Vaping Use   Vaping  Use: Never used  Substance Use Topics   Alcohol use: No    Comment: 04-05-2016 per pt no   Drug use: No    Comment: 04-05-2016 per pt no     Allergies:  Allergies  Allergen Reactions   Fish-Derived Products Nausea And Vomiting    Shell fish make her sick   Shellfish Allergy Nausea And Vomiting    Medications Prior to Admission  Medication Sig Dispense Refill Last Dose   acetaminophen (TYLENOL) 500 MG tablet Take 500 mg by mouth every 6 (six) hours as needed for mild pain or moderate pain.   Past Month   aspirin 81 MG EC tablet Take 1 tablet (81 mg total) by mouth daily. Swallow whole. 90 tablet 3 Past Month   Prenatal Vit-Fe Fumarate-FA (MULTIVITAMIN-PRENATAL) 27-0.8 MG TABS tablet Take 1 tablet by mouth daily at 12 noon.   08/05/2021   Blood Glucose Monitoring Suppl (ONETOUCH VERIO REFLECT)  w/Device KIT       fluticasone (FLONASE) 50 MCG/ACT nasal spray Place into both nostrils daily as needed for allergies or rhinitis.      Lancets (ONETOUCH DELICA PLUS [id number]) MISC Apply topically.      ONETOUCH VERIO test strip 4 (four) times daily.       Review of Systems  Gastrointestinal:  Positive for abdominal pain.  All other systems reviewed and are negative. Physical Exam   Blood pressure 128/79, pulse (!) 111, temperature 98.3 F (36.8 C), temperature source Oral, resp. rate 18, height '5\' 6"'  (1.676 m), weight 100 kg, last menstrual period 12/20/2020, SpO2 99 %.  Physical Exam Vitals and nursing note reviewed. Exam conducted with a chaperone present.  Constitutional:      Appearance: She is well-developed. She is not ill-appearing.  Cardiovascular:     Rate and Rhythm: Normal rate and regular rhythm.  Pulmonary:     Effort: Pulmonary effort is normal.     Breath sounds: Normal breath sounds.  Abdominal:     Tenderness: There is no abdominal tenderness.     Comments: Gravid  Genitourinary:    Comments: Pelvic exam: External genitalia normal, vaginal walls pink and well rugated, cervix visually closed, no lesions noted. Scant thin whitish discharge c/w Bacterial Vaginosis  Skin:    Capillary Refill: Capillary refill takes less than 2 seconds.  Neurological:     Mental Status: She is alert and oriented to person, place, and time.  Psychiatric:        Mood and Affect: Mood normal.        Behavior: Behavior normal.    MAU Course  Procedures  --Cervix visually closed --UI on toco. Pain score 6/10. Patient declines Tylenol + Flexeril but consents to IV fluid bolus --Reactive tracing: baseline 140, mod var, + accels, no decels --Toco: rare contraction, resolving to UI s/p fluid bolus  Patient Vitals for the past 24 hrs:  BP Temp Temp src Pulse Resp SpO2 Height Weight  08/05/21 2325 122/67 -- -- 81 -- 100 % -- --  08/05/21 2040 128/79 98.3 F (36.8 C) Oral (!) 111  18 99 % '5\' 6"'  (1.676 m) 100 kg   Results for orders placed or performed during the hospital encounter of 08/05/21 (from the past 24 hour(s))  Urinalysis, Routine w reflex microscopic Urine, Clean Catch     Status: Abnormal   Collection Time: 08/05/21  8:43 PM  Result Value Ref Range   Color, Urine YELLOW YELLOW   APPearance CLEAR CLEAR   Specific Gravity,  Urine 1.014 1.005 - 1.030   pH 5.0 5.0 - 8.0   Glucose, UA 50 (A) NEGATIVE mg/dL   Hgb urine dipstick NEGATIVE NEGATIVE   Bilirubin Urine NEGATIVE NEGATIVE   Ketones, ur 20 (A) NEGATIVE mg/dL   Protein, ur NEGATIVE NEGATIVE mg/dL   Nitrite NEGATIVE NEGATIVE   Leukocytes,Ua NEGATIVE NEGATIVE  Wet prep, genital     Status: Abnormal   Collection Time: 08/05/21  9:06 PM  Result Value Ref Range   Yeast Wet Prep HPF POC NONE SEEN NONE SEEN   Trich, Wet Prep NONE SEEN NONE SEEN   Clue Cells Wet Prep HPF POC PRESENT (A) NONE SEEN   WBC, Wet Prep HPF POC >=10 (A) <10   Sperm NONE SEEN   Glucose, capillary     Status: Abnormal   Collection Time: 08/05/21  9:18 PM  Result Value Ref Range   Glucose-Capillary 122 (H) 70 - 99 mg/dL   Meds ordered this encounter  Medications   lactated ringers bolus 1,000 mL   metroNIDAZOLE (FLAGYL) 500 MG tablet    Sig: Take 1 tablet (500 mg total) by mouth 2 (two) times daily.    Dispense:  14 tablet    Refill:  0    Order Specific Question:   Supervising Provider    Answer:   Rip Harbour, MICHAEL L [[id number]]   Assessment and Plan  --31 y.o. G1P0000 at [redacted]w[redacted]d --Reactive tracing --Closed cervix --Musculoskeletal pain resolved with fluid bolus --Pain score 0/10 at discharge --Discharge home in stable condition  SDarlina Rumpf CNM 08/06/2021, 12:07 AM

## 2021-08-05 NOTE — MAU Note (Signed)
Susan Chandler is a 31 y.o. at [redacted]w[redacted]d here in MAU reporting: Pt reports lower abdominal/pelvic pain since yesterday. Does get relief when on her side.+FM, no bleeding or LOF. LMP: 12/20/20 Onset of complaint: 07/04/21 Pain score: 6/10 Vitals:   08/05/21 2040  BP: 128/79  Pulse: (!) 111  Resp: 18  Temp: 98.3 F (36.8 C)  SpO2: 99%     FHT:158  Lab orders placed from triage: Urinalysis

## 2021-08-06 ENCOUNTER — Encounter: Payer: Self-pay | Admitting: Advanced Practice Midwife

## 2021-08-06 LAB — GC/CHLAMYDIA PROBE AMP (~~LOC~~) NOT AT ARMC
Chlamydia: NEGATIVE
Comment: NEGATIVE
Comment: NORMAL
Neisseria Gonorrhea: NEGATIVE

## 2021-08-08 ENCOUNTER — Ambulatory Visit: Payer: 59

## 2021-08-13 ENCOUNTER — Other Ambulatory Visit: Payer: Self-pay

## 2021-08-13 ENCOUNTER — Encounter (HOSPITAL_COMMUNITY): Payer: Self-pay | Admitting: Obstetrics & Gynecology

## 2021-08-13 ENCOUNTER — Inpatient Hospital Stay (HOSPITAL_COMMUNITY)
Admission: AD | Admit: 2021-08-13 | Discharge: 2021-08-13 | Disposition: A | Discharge status: Home / Self Care | Payer: 59 | Attending: Obstetrics & Gynecology | Admitting: Obstetrics & Gynecology

## 2021-08-13 DIAGNOSIS — O2313 Infections of bladder in pregnancy, third trimester: Secondary | ICD-10-CM | POA: Insufficient documentation

## 2021-08-13 DIAGNOSIS — O26893 Other specified pregnancy related conditions, third trimester: Secondary | ICD-10-CM | POA: Insufficient documentation

## 2021-08-13 DIAGNOSIS — M545 Low back pain, unspecified: Secondary | ICD-10-CM | POA: Insufficient documentation

## 2021-08-13 DIAGNOSIS — Z3A32 32 weeks gestation of pregnancy: Secondary | ICD-10-CM | POA: Insufficient documentation

## 2021-08-13 DIAGNOSIS — O99891 Other specified diseases and conditions complicating pregnancy: Secondary | ICD-10-CM

## 2021-08-13 DIAGNOSIS — M549 Dorsalgia, unspecified: Secondary | ICD-10-CM | POA: Diagnosis not present

## 2021-08-13 DIAGNOSIS — O0990 Supervision of high risk pregnancy, unspecified, unspecified trimester: Secondary | ICD-10-CM

## 2021-08-13 DIAGNOSIS — O26899 Other specified pregnancy related conditions, unspecified trimester: Secondary | ICD-10-CM

## 2021-08-13 DIAGNOSIS — N3000 Acute cystitis without hematuria: Secondary | ICD-10-CM | POA: Diagnosis not present

## 2021-08-13 DIAGNOSIS — R1030 Lower abdominal pain, unspecified: Secondary | ICD-10-CM | POA: Insufficient documentation

## 2021-08-13 LAB — URINALYSIS, ROUTINE W REFLEX MICROSCOPIC
Bilirubin Urine: NEGATIVE
Glucose, UA: NEGATIVE mg/dL
Hgb urine dipstick: NEGATIVE
Ketones, ur: NEGATIVE mg/dL
Nitrite: NEGATIVE
Protein, ur: NEGATIVE mg/dL
Specific Gravity, Urine: 1.005 (ref 1.005–1.030)
WBC, UA: >50 WBC/hpf — ABNORMAL HIGH (ref 0–5)
pH: 7 (ref 5.0–8.0)

## 2021-08-13 MED ORDER — CEFADROXIL 500 MG PO CAPS
500.0000 mg | ORAL_CAPSULE | Freq: Two times a day (BID) | ORAL | Status: DC
  Filled 2021-08-13 (×2): qty 1

## 2021-08-13 MED ORDER — ACETAMINOPHEN 500 MG PO TABS
1000.0000 mg | ORAL_TABLET | Freq: Once | ORAL | Status: AC
  Administered 2021-08-13: 1000 mg via ORAL
  Filled 2021-08-13: qty 2

## 2021-08-13 MED ORDER — CEFADROXIL 500 MG PO CAPS: 500.0000 mg | ORAL_CAPSULE | Freq: Two times a day (BID) | ORAL | 0 refills | Status: AC

## 2021-08-13 MED ORDER — ACETAMINOPHEN 500 MG PO TABS: 1000.0000 mg | ORAL_TABLET | Freq: Three times a day (TID) | ORAL | 0 refills | Status: DC | PRN

## 2021-08-13 NOTE — Discharge Instructions (Addendum)
You came to the MAU because you had constant lower abdominal pain and also back pain since last night. We watched you on the monitor and your baby's heart rate looked normal and you did not have any contractions on the monitor. We checked your cervix and it was closed. We do not think you are in preterm labor.  We think your back pain is musculoskeletal and you can try heat packs, a belly band, and tylenol for the pain.  We checked your urine and there was some bacteria and other proteins that might mean you have a urinary tract infection. We have sent your urine for culture to get a definitive diagnosis. But given your symptoms and the preliminary urine test we went ahead and treated you for a UTI. We will call you if we need to change your medication based on your urine culture results which should be back in 1-2 days.

## 2021-08-13 NOTE — MAU Note (Signed)
Susan Chandler is a 31 y.o. at [redacted]w[redacted]d here in MAU reporting: lower back and lower abdominal cramping started at 0330 this AM. Has not taken anything for pain. Pain is constant. No bleeding or LOF. +FM  Onset of complaint: today  Pain score: back 6/10, abdomen 6/10  Vitals:   08/13/21 1420  BP: 119/78  Pulse: 88  Resp: 16  Temp: 98.1 F (36.7 C)  SpO2: 99%     FHT: EFM applied in room  Lab orders placed from triage: UA

## 2021-08-13 NOTE — MAU Provider Note (Signed)
History     CSN: [id number]  Arrival date and time: 08/13/21 1349   None     Chief Complaint  Patient presents with   Abdominal Pain   Back Pain   HPI 31 yo G1PO at 29w3dwith hx of A1GDM who presents with lower abdominal constant ache that started ~12 hours ago.  #Abdominal pain Abdominal pain started 0330 Middle of abdomen Currently 6/10 , when laying down 4/10 Has not tried anything for pain No vaginal bleeding No change in discharge No LOF No n/v/diarrhea Denies urinary symptoms  #Hx of chronic back pain #Acute worsening of low back pain Has had dull back pain in past including when not pregnant Also has history of scoliosis Fell back ache got worse since abdominal pain started Feels dull ache Low back Denies dysuria, hematuria, pyuria No fevers or chills or systemic symptoms   OB History     Gravida  1   Para  0   Term  0   Preterm  0   AB  0   Living  0      SAB  0   IAB  0   Ectopic  0   Multiple  0   Live Births  0           Past Medical History:  Diagnosis Date   Abdominal pain    Persistent Mar/Apr 2020: w/u pretty unrevealing by me ([place]); pt to see GI 10/21/18.   Abnormal uterine bleeding    Dr. EElonda Husky Nuvaring as of 09/2019   B[place] disorder ([place]    r/o OCD per psychiatrist 03/2016.  Also, psychologist eval revealed suspected Bipolar II, depressed phase 04/16/2016.   Chronic pain syndrome    Dysmenorrhea    mgmt per Dr. EElonda Husky(01/2018).  Nuva Ring vag inserts 07/2018.   Erosive gastropathy 10/2018   Nonbleeding (EGD 10/2018)   GAD (generalized anxiety disorder)    Infectious mononucleosis 2007   Lumbosacral radiculopathy at L5 2015   [facility]; [facility] where she got ESI left L5-S1 11/2013 with moderate improvement.  Saw neurosurgeon 01/2016: surgery NOT recommended.   Severe recurrence 01/2021->rpt MRI w/out neural compression->plan for ESI L5-S1  left.   Migraine syndrome    Obesity, Class I, BMI 30-34.9    Pre-syncope 2016   ? POTS.  Holter showed mostly NSR, occ PACs, one run of nonsustained atrial tachy.   Salmonella enteritis 09/2017   Scoliosis     Past Surgical History:  Procedure Laterality Date   BIOPSY  10/26/2018   Procedure: BIOPSY;  Surgeon: RRogene Houston MD;  [facility];  Service: Endoscopy;;   ESOPHAGOGASTRODUODENOSCOPY  10/2018   Nonbleeding erosive gastropathy, H pylori NEG.   ESOPHAGOGASTRODUODENOSCOPY (EGD) WITH PROPOFOL N/A 10/26/2018   Procedure: ESOPHAGOGASTRODUODENOSCOPY (EGD) WITH PROPOFOL;  Surgeon: RRogene Houston MD;  [facility];  Service: Endoscopy;  Laterality: N/A;   WISDOM TOOTH EXTRACTION      Family History  Problem Relation Age of Onset   Bipolar disorder Father    Bipolar disorder Sister    Other Sister        e coli   Cancer Maternal Aunt    Alcohol abuse Maternal Grandfather     Social History   Tobacco Use   Smoking status: Never   Smokeless tobacco: Never  Vaping Use   Vaping Use: Never used  Substance Use Topics  Alcohol use: No    Comment: 04-05-2016 per pt no   Drug use: No    Comment: 04-05-2016 per pt no     Allergies:  Allergies  Allergen Reactions   Shellfish Allergy Nausea And Vomiting    Medications Prior to Admission  Medication Sig Dispense Refill Last Dose   aspirin 81 MG EC tablet Take 1 tablet (81 mg total) by mouth daily. Swallow whole. 90 tablet 3    Blood Glucose Monitoring Suppl (ONETOUCH VERIO REFLECT) w/Device KIT       fluticasone (FLONASE) 50 MCG/ACT nasal spray Place into both nostrils daily as needed for allergies or rhinitis.      Lancets (ONETOUCH DELICA PLUS [id number]) MISC Apply topically.      metroNIDAZOLE (FLAGYL) 500 MG tablet Take 1 tablet (500 mg total) by mouth 2 (two) times daily. 14 tablet 0    ONETOUCH VERIO test strip 4 (four) times daily.      Prenatal Vit-Fe Fumarate-FA (MULTIVITAMIN-PRENATAL)  27-0.8 MG TABS tablet Take 1 tablet by mouth daily at 12 noon.      [DISCONTINUED] acetaminophen (TYLENOL) 500 MG tablet Take 500 mg by mouth every 6 (six) hours as needed for mild pain or moderate pain.       Review of Systems  Constitutional:  Negative for chills and fever.  Respiratory:  Negative for shortness of breath.   Gastrointestinal:  Positive for abdominal pain. Negative for constipation, diarrhea, nausea and vomiting.  Endocrine: Negative for polyuria.  Genitourinary:  Negative for dysuria, flank pain, frequency, hematuria, urgency, vaginal bleeding, vaginal discharge and vaginal pain.  Musculoskeletal:  Positive for back pain. Negative for myalgias.  Skin:  Negative for rash.  Allergic/Immunologic: Negative for immunocompromised state.  Neurological:  Negative for headaches.  Hematological:  Negative for adenopathy.  Psychiatric/Behavioral:  Negative for agitation.   Physical Exam   Blood pressure 119/78, pulse 88, temperature 98.1 F (36.7 C), temperature source Oral, resp. rate 16, height $RemoveBe'5\' 6"'jEacLSNvN$  (1.676 m), weight 101.2 kg, last menstrual period 12/20/2020, SpO2 99 %.  Physical Exam Vitals and nursing note reviewed.  Constitutional:      Appearance: She is well-developed.  HENT:     Head: Normocephalic.     Mouth/Throat:     Mouth: Mucous membranes are moist.  Eyes:     Extraocular Movements: Extraocular movements intact.  Cardiovascular:     Rate and Rhythm: Normal rate and regular rhythm.     Heart sounds: Normal heart sounds.  Pulmonary:     Effort: Pulmonary effort is normal.  Abdominal:     General: Abdomen is flat. Bowel sounds are normal. There is no distension.     Palpations: Abdomen is soft.     Tenderness: There is abdominal tenderness in the suprapubic area. There is no right CVA tenderness, left CVA tenderness, guarding or rebound.     Comments: Mild TTP of lower abdomen. No guarding.   Neurological:     General: No focal deficit present.      Mental Status: She is alert and oriented to person, place, and time.  Psychiatric:        Mood and Affect: Mood normal.    MAU Course  Procedures NST reactive with baseline 145-150, accels, and no decels Some uterine irritability noted and one contraction at end that patient did not feel.   MDM Moderate 31 yo G1PO at [redacted]w[redacted]d with hx of A1GDM who presents with lower abdominal constant ache that started ~12 hours ago. Patient without  intermittent worsening of pain or contractions, no vaginal bleeding, one contraction on monitor during entire ~1.5 hours of tracing, and cervix closed. Low suspicion for preterm labor. On exam mildly tender in lower quadrants and suprapubic area without CVA tenderness and with normal vital signs. UA suspicious for cystitis with leukocytes and bacteria and will presumptively treat with Duricef and send for urine culture for confirmation.   Assessment and Plan  31 yo G1PO at 54w3dwith hx of A1GDM who presents with lower abdominal constant ache that started ~12 hours ago.  Abdominal pain Acute cystitis 31yo G1PO at 345w3dith hx of A1GDM who presents with lower abdominal constant ache that started ~12 hours ago. Patient without intermittent worsening of pain or contractions, no vaginal bleeding, one contraction on monitor during entire ~1.5 hours of tracing, and cervix closed. Low suspicion for preterm labor. On exam mildly tender in lower quadrants and suprapubic area without CVA tenderness and with normal vital signs. UA suspicious for cystitis with leukocytes and bacteria and will presumptively treat with Duricef and send for urine culture for confirmation.  - Duricef BID x 5 days - Urine culture sent - discussed return precautions in detail including usual OB precautions, signs of preterm labor as well as signs of systemic infection.   3. Low back pain Hx of low back pain in pregnancy as well as prior to pregnancy.On exam area of pain in L4-L5 area that improved  with palpation, no CVA tenderness. Likely related to chronic musculoskeletal back pain that is exacerbated in setting of third trimester of pregnancy. Discussed using analgesics such as tylenol as needed. Also discussed trying belly band, heat packs, and support person massage. Also discussed return precautions in detail  AnRenard MatterMD, MPH OB Fellow, Faculty Practice

## 2021-08-14 ENCOUNTER — Encounter: Payer: Self-pay | Admitting: Nutrition

## 2021-08-14 ENCOUNTER — Encounter: Payer: Self-pay | Admitting: Advanced Practice Midwife

## 2021-08-14 NOTE — Patient Instructions (Signed)
°  Patient instructed to monitor glucose levels: FBS: 60 - <90 1 hour: <140 2 hour: <120 

## 2021-08-15 LAB — CULTURE, OB URINE

## 2021-08-16 ENCOUNTER — Encounter: Payer: Self-pay | Admitting: Family Medicine

## 2021-08-18 ENCOUNTER — Encounter: Payer: Self-pay | Admitting: Advanced Practice Midwife

## 2021-08-20 ENCOUNTER — Other Ambulatory Visit: Payer: Self-pay | Admitting: Obstetrics & Gynecology

## 2021-08-20 DIAGNOSIS — O24410 Gestational diabetes mellitus in pregnancy, diet controlled: Secondary | ICD-10-CM

## 2021-08-21 ENCOUNTER — Encounter: Payer: Self-pay | Admitting: Women's Health

## 2021-08-21 ENCOUNTER — Other Ambulatory Visit: Payer: Self-pay

## 2021-08-21 ENCOUNTER — Ambulatory Visit (INDEPENDENT_AMBULATORY_CARE_PROVIDER_SITE_OTHER): Payer: 59

## 2021-08-21 ENCOUNTER — Ambulatory Visit (INDEPENDENT_AMBULATORY_CARE_PROVIDER_SITE_OTHER): Payer: 59 | Admitting: Women's Health

## 2021-08-21 VITALS — BP 113/81 | HR 86 | Wt 226.5 lb

## 2021-08-21 DIAGNOSIS — O24410 Gestational diabetes mellitus in pregnancy, diet controlled: Secondary | ICD-10-CM | POA: Diagnosis not present

## 2021-08-21 DIAGNOSIS — O0990 Supervision of high risk pregnancy, unspecified, unspecified trimester: Secondary | ICD-10-CM | POA: Diagnosis not present

## 2021-08-21 DIAGNOSIS — Z23 Encounter for immunization: Secondary | ICD-10-CM | POA: Diagnosis not present

## 2021-08-21 DIAGNOSIS — Z3A33 33 weeks gestation of pregnancy: Secondary | ICD-10-CM

## 2021-08-21 DIAGNOSIS — O0993 Supervision of high risk pregnancy, unspecified, third trimester: Secondary | ICD-10-CM

## 2021-08-21 LAB — POCT URINALYSIS DIPSTICK OB
Blood, UA: NEGATIVE
Glucose, UA: NEGATIVE
Ketones, UA: NEGATIVE
Nitrite, UA: NEGATIVE
POC,PROTEIN,UA: NEGATIVE

## 2021-08-21 NOTE — Progress Notes (Signed)
[facility] PREGNANCY VISIT Patient name: Susan Chandler MRN [id number]  Date of birth: 1991-04-23 Chief Complaint:   High Risk Gestation ([place] today; + swelling)  History of Present Illness:   Susan Chandler is a 31 y.o. G74P0000 female at [redacted]w[redacted]d with an Estimated Date of Delivery: 10/05/21 being seen today for ongoing management of a high-risk pregnancy complicated by diabetes mellitus A1DM.    Today she reports  all fbs <95, about 3 of her 2hr pp >120 (highest 130) . Swelling in feet. Home bp's wnl.  Contractions: Not present. Vag. Bleeding: None.  Movement: Present. denies leaking of fluid.   Depression screen [facility] 2/9 07/26/2021 07/10/2021 04/05/2021 01/26/2021 09/18/2020  Decreased Interest 0 0 0 1 1  Down, Depressed, Hopeless 0 0 0 1 1  PHQ - 2 Score 0 0 0 2 2  Altered sleeping - 1 0 1 1  Tired, decreased energy - 0 0 1 1  Change in appetite - 1 0 1 1  Feeling bad or failure about yourself  - 0 0 1 0  Trouble concentrating - 0 0 0 1  Moving slowly or fidgety/restless - 0 0 0 0  Suicidal thoughts - 0 0 0 0  PHQ-9 Score - 2 0 6 6  Difficult doing work/chores - - - Somewhat difficult -  Some recent data might be hidden     GAD 7 : Generalized Anxiety Score 07/10/2021 04/05/2021 09/18/2020  Nervous, Anxious, on Edge 0 0 1  Control/stop worrying 0 0 1  Worry too much - different things 0 0 1  Trouble relaxing 0 0 1  Restless 0 0 1  Easily annoyed or irritable 0 0 1  Afraid - awful might happen 0 0 1  Total GAD 7 Score 0 0 7     Review of Systems:   Pertinent items are noted in HPI Denies abnormal vaginal discharge w/ itching/odor/irritation, headaches, visual changes, shortness of breath, chest pain, abdominal pain, severe nausea/vomiting, or problems with urination or bowel movements unless otherwise stated above. Pertinent History Reviewed:  Reviewed past medical,surgical, social, obstetrical and family history.  Reviewed problem list, medications and allergies. Physical  Assessment:   Vitals:   08/21/21 1127  BP: 113/81  Pulse: 86  Weight: 226 lb 8 oz (102.7 kg)  Body mass index is 36.56 kg/m.           Physical Examination:   General appearance: alert, well appearing, and in no distress  Mental status: alert, oriented to person, place, and time  Skin: warm & dry   Extremities: Edema: Mild pitting, slight indentation    Cardiovascular: normal heart rate noted  Respiratory: normal respiratory effort, no distress  Abdomen: gravid, soft, non-tender  Pelvic: Cervical exam deferred         Fetal Status: Fetal Heart Rate (bpm): 150 u/s   Movement: Present    Fetal Surveillance Testing today: [place] 33+4 wks,complete breech,cx 3.3 cm,anterior fundal placenta gr 2,AFI 11 cm,FHR 150 BPM,EFW 2657 g 89%,AC 98%  Chaperone: N/A    Results for orders placed or performed in visit on 08/21/21 (from the past 24 hour(s))  POC Urinalysis Dipstick OB   Collection Time: 08/21/21 11:29 AM  Result Value Ref Range   Color, UA     Clarity, UA     Glucose, UA Negative Negative   Bilirubin, UA     Ketones, UA neg    Spec Grav, UA     Blood, UA neg  pH, UA     POC,PROTEIN,UA Negative Negative, Trace, Small (1+), Moderate (2+), Large (3+), 4+   Urobilinogen, UA     Nitrite, UA neg    Leukocytes, UA Moderate (2+) (A) Negative   Appearance     Odor      Assessment & Plan:  High-risk pregnancy: G1P0000 at [redacted]w[redacted]d with an Estimated Date of Delivery: 10/05/21   1) A1DM, stable  2) BLE edema, stable, compression stockings, elevate legs when able  3) Borderline LGA> 89% today  4) Breech presentation> [email or web address]  Meds: No orders of the defined types were placed in this encounter.   Labs/procedures today: tdap and U/S  Treatment Plan:   Growth u/s q4wks    No antenatal testing as long as remains off meds     Deliver @ 39-40wks:____   Reviewed: Preterm labor symptoms and general obstetric precautions including but not limited to vaginal bleeding,  contractions, leaking of fluid and fetal movement were reviewed in detail with the patient.  All questions were answered. Does have home bp cuff. Office bp cuff given: not applicable. Check bp weekly, let us know if consistently >140 and/or >90.  Follow-up: Return in about 2 weeks (around 09/04/2021) for [place], MD or CNM, in person.   Future Appointments  Date Time Provider [place]  09/11/2021 10:45 AM [facility] [place] [facility] None  09/11/2021 11:50 AM Janyth Pupa, DO [facility]    Orders Placed This Encounter  Procedures   Tdap vaccine greater than or equal to 7yo IM   POC Urinalysis Dipstick OB   [place], [facility] 08/21/2021 12:27 PM

## 2021-08-21 NOTE — Patient Instructions (Addendum)
Susan Chandler, thank you for choosing our office today! We appreciate the opportunity to meet your healthcare needs. You may receive a short survey by mail, e-mail, or through [employer]. If you are happy with your care we would appreciate if you could take just a few minutes to complete the survey questions. We read all of your comments and take your feedback very seriously. Thank you again for choosing our office.  [facility] [employer] Team at [place] [facility] ([place], [place] [address]) Entrance C, located off of [place] parking   [email or web address]  CLASSES: Go to [email or web address] to register for classes (childbirth, breastfeeding, waterbirth, infant CPR, daddy bootcamp, etc.)  Call the office [phone]) or go to [facility] if: You begin to have strong, frequent contractions Your water breaks.  Sometimes it is a big gush of fluid, sometimes it is just a trickle that keeps getting your panties wet or running down your legs You have vaginal bleeding.  It is normal to have a small amount of spotting if your cervix was checked.  You don't feel your baby moving like normal.  If you don't, get you something to eat and drink and lay down and focus on feeling your baby move.   If your baby is still not moving like normal, you should call the office or go to [facility].  Call the office [phone]) or go to [facility] hospital for these signs of pre-eclampsia: Severe headache that does not go away with Tylenol Visual changes- seeing spots, double, blurred vision Pain under your right breast or upper abdomen that does not go away with Tums or heartburn medicine Nausea and/or vomiting Severe swelling in your hands, feet, and face   Tdap Vaccine It is recommended that you get the Tdap vaccine during the third trimester of EACH pregnancy to help protect your baby from getting pertussis (whooping cough) 27-36 weeks is  the BEST time to do this so that you can pass the protection on to your baby. During pregnancy is better than after pregnancy, but if you are unable to get it during pregnancy it will be offered at the hospital.  You can get this vaccine with [place], at the health department, your family doctor, or some local pharmacies Everyone who will be around your baby should also be up-to-date on their vaccines before the baby comes. Adults (who are not pregnant) only need 1 dose of Tdap during adulthood.   [facility] Pediatricians/Family Doctors [facility]): [address] [place], [facility]: [address] [place], [phone]                [place] [facility]): [place], [phone] (call to ask if accepting patients) [facility] Department: [place] [address], [place], [place] Pediatricians/Family Doctors [facility]): [place] [place], [address], [place] [facility]: [address] [place], [place] [facility]: [place], [place] [facility]): [phone] [facility]: [address], [place]: [address] [facility]: [phone], [phone]  Home Blood Pressure Monitoring for Patients   Your provider has recommended that you  check your blood pressure (BP) at least once a week at home. If you do not have a blood pressure cuff at home, one will be provided for you. Contact your provider if you have not received your monitor within 1 week.   Helpful Tips for Accurate Home Blood Pressure Checks  Don't smoke, exercise, or drink caffeine 30 minutes before checking your BP Use the restroom before checking your BP (a full  bladder can raise your pressure) Relax in a comfortable upright chair Feet on the ground Left arm resting comfortably on a flat surface at the level of your heart Legs uncrossed Back supported Sit quietly and don't talk Place the cuff on your bare arm Adjust snuggly, so that only two fingertips can fit between your skin and the top of the cuff Check 2 readings separated by at least one minute Keep a log of your BP readings For a visual, please reference this diagram: http://ccnc.care/bpdiagram  Provider Name: [facility]     Phone: [phone]  Zone 1: ALL CLEAR  Continue to monitor your symptoms:  BP reading is less than 140 (top number) or less than 90 (bottom number)  No right upper stomach pain No headaches or seeing spots No feeling nauseated or throwing up No swelling in face and hands  Zone 2: CAUTION Call your doctor's office for any of the following:  BP reading is greater than 140 (top number) or greater than 90 (bottom number)  Stomach pain under your ribs in the middle or right side Headaches or seeing spots Feeling nauseated or throwing up Swelling in face and hands  Zone 3: EMERGENCY  Seek immediate medical care if you have any of the following:  BP reading is greater than160 (top number) or greater than 110 (bottom number) Severe headaches not improving with Tylenol Serious difficulty catching your breath Any worsening symptoms from Zone 2  Preterm Labor and Birth Information  The normal length of a pregnancy is 39-41 weeks. Preterm labor is when labor starts before 37 completed weeks of pregnancy. What are the risk factors for preterm labor? Preterm labor is more likely to occur in women who: Have certain infections during pregnancy such as a bladder infection, sexually transmitted infection, or infection inside the uterus (chorioamnionitis). Have a shorter-than-normal cervix. Have gone into preterm labor before. Have had surgery on their  cervix. Are younger than age 54 or older than age 31. Are African American. Are pregnant with twins or multiple babies (multiple gestation). Take street drugs or smoke while pregnant. Do not gain enough weight while pregnant. Became pregnant shortly after having been pregnant. What are the symptoms of preterm labor? Symptoms of preterm labor include: Cramps similar to those that can happen during a menstrual period. The cramps may happen with diarrhea. Pain in the abdomen or lower back. Regular uterine contractions that may feel like tightening of the abdomen. A feeling of increased pressure in the pelvis. Increased watery or bloody mucus discharge from the vagina. Water breaking (ruptured amniotic sac). Why is it important to recognize signs of preterm labor? It is important to recognize signs of preterm labor because babies who are born prematurely may not be fully developed. This can put them at an increased risk for: Long-term (chronic) heart and lung problems. Difficulty immediately after birth with regulating body systems, including blood sugar, body temperature, heart rate, and breathing rate. Bleeding in the brain. Cerebral palsy. Learning difficulties. Death. These risks are highest for babies who are born before  34 weeks of pregnancy. How is preterm labor treated? Treatment depends on the length of your pregnancy, your condition, and the health of your baby. It may involve: Having a stitch (suture) placed in your cervix to prevent your cervix from opening too early (cerclage). Taking or being given medicines, such as: Hormone medicines. These may be given early in pregnancy to help support the pregnancy. Medicine to stop contractions. Medicines to help mature the babys lungs. These may be prescribed if the risk of delivery is high. Medicines to prevent your baby from developing cerebral palsy. If the labor happens before 34 weeks of pregnancy, you may need to stay in the  hospital. What should I do if I think I am in preterm labor? If you think that you are going into preterm labor, call your health care provider right away. How can I prevent preterm labor in future pregnancies? To increase your chance of having a full-term pregnancy: Do not use any tobacco products, such as cigarettes, chewing tobacco, and e-cigarettes. If you need help quitting, ask your health care provider. Do not use street drugs or medicines that have not been prescribed to you during your pregnancy. Talk with your health care provider before taking any herbal supplements, even if you have been taking them regularly. Make sure you gain a healthy amount of weight during your pregnancy. Watch for infection. If you think that you might have an infection, get it checked right away. Make sure to tell your health care provider if you have gone into preterm labor before. This information is not intended to replace advice given to you by your health care provider. Make sure you discuss any questions you have with your health care provider. Document Revised: 10/02/2018 Document Reviewed: 11/01/2015 Elsevier Patient Education  [place].

## 2021-08-21 NOTE — Progress Notes (Signed)
[place] 33+4 wks,complete breech,cx 3.3 cm,anterior fundal placenta gr 2,AFI 11 cm,FHR 150 BPM,EFW 2657 g 89%,AC 98%

## 2021-08-22 ENCOUNTER — Encounter: Payer: Self-pay | Admitting: Women's Health

## 2021-09-02 ENCOUNTER — Encounter: Payer: Self-pay | Admitting: Women's Health

## 2021-09-04 ENCOUNTER — Encounter: Payer: 59 | Admitting: Women's Health

## 2021-09-04 ENCOUNTER — Encounter: Payer: Self-pay | Admitting: Women's Health

## 2021-09-04 ENCOUNTER — Other Ambulatory Visit (HOSPITAL_COMMUNITY)
Admission: RE | Admit: 2021-09-04 | Discharge: 2021-09-04 | Disposition: A | Discharge status: Home / Self Care | Payer: 59 | Source: Ambulatory Visit | Attending: Women's Health | Admitting: Women's Health

## 2021-09-04 ENCOUNTER — Other Ambulatory Visit: Payer: Self-pay

## 2021-09-04 ENCOUNTER — Ambulatory Visit (INDEPENDENT_AMBULATORY_CARE_PROVIDER_SITE_OTHER): Payer: 59 | Admitting: Women's Health

## 2021-09-04 VITALS — BP 125/87 | HR 87 | Wt 227.0 lb

## 2021-09-04 DIAGNOSIS — N898 Other specified noninflammatory disorders of vagina: Secondary | ICD-10-CM

## 2021-09-04 DIAGNOSIS — O24410 Gestational diabetes mellitus in pregnancy, diet controlled: Secondary | ICD-10-CM

## 2021-09-04 DIAGNOSIS — O0993 Supervision of high risk pregnancy, unspecified, third trimester: Secondary | ICD-10-CM

## 2021-09-04 DIAGNOSIS — O99891 Other specified diseases and conditions complicating pregnancy: Secondary | ICD-10-CM

## 2021-09-04 DIAGNOSIS — R8271 Bacteriuria: Secondary | ICD-10-CM

## 2021-09-04 LAB — POCT URINALYSIS DIPSTICK OB
Blood, UA: NEGATIVE
Glucose, UA: NEGATIVE
Ketones, UA: NEGATIVE
Leukocytes, UA: NEGATIVE
Nitrite, UA: NEGATIVE

## 2021-09-04 LAB — OB RESULTS CONSOLE GC/CHLAMYDIA: Gonorrhea: NEGATIVE

## 2021-09-04 MED ORDER — CEPHALEXIN 500 MG PO CAPS: 500.0000 mg | ORAL_CAPSULE | Freq: Four times a day (QID) | ORAL | 0 refills | Status: DC

## 2021-09-04 NOTE — Progress Notes (Signed)
? ?HIGH-RISK PREGNANCY VISIT ?Patient name: Susan Chandler MRN [id number]  Date of birth: 1991-01-12 ?Chief Complaint:   ?Routine Prenatal Visit and High Risk Gestation ? ?History of Present Illness:   ?Susan Chandler is a 31 y.o. G34P0000 female at [redacted]w[redacted]d with an Estimated Date of Delivery: 10/05/21 being seen today for ongoing management of a high-risk pregnancy complicated by diabetes mellitus A1DM.   ? ?Today she reports  fbs all <95, 2 or 3 of her 2hr >120 (highest 140s, ate [place]), checks it again in 30mins or so and <120  . White discharge, no itching/odor/irritation, wants it checked out. 2 bumps Rt vulva. Ingrown hair vs boil Lt upper mons/groin x ~2wks, has been squeezing it, gets some blood out. Is tender.  Contractions: Not present. Vag. Bleeding: None.  Movement: Present. denies leaking of fluid.  ? ?Depression screen [facility] 2/9 07/26/2021 07/10/2021 04/05/2021 01/26/2021 09/18/2020  ?Decreased Interest 0 0 0 1 1  ?Down, Depressed, Hopeless 0 0 0 1 1  ?PHQ - 2 Score 0 0 0 2 2  ?Altered sleeping - 1 0 1 1  ?Tired, decreased energy - 0 0 1 1  ?Change in appetite - 1 0 1 1  ?Feeling bad or failure about yourself  - 0 0 1 0  ?Trouble concentrating - 0 0 0 1  ?Moving slowly or fidgety/restless - 0 0 0 0  ?Suicidal thoughts - 0 0 0 0  ?PHQ-9 Score - 2 0 6 6  ?Difficult doing work/chores - - - Somewhat difficult -  ?Some recent data might be hidden  ? ?  ?GAD 7 : Generalized Anxiety Score 07/10/2021 04/05/2021 09/18/2020  ?Nervous, Anxious, on Edge 0 0 1  ?Control/stop worrying 0 0 1  ?Worry too much - different things 0 0 1  ?Trouble relaxing 0 0 1  ?Restless 0 0 1  ?Easily annoyed or irritable 0 0 1  ?Afraid - awful might happen 0 0 1  ?Total GAD 7 Score 0 0 7  ? ? ? ?Review of Systems:   ?Pertinent items are noted in HPI ?Denies abnormal vaginal discharge w/ itching/odor/irritation, headaches, visual changes, shortness of breath, chest pain, abdominal pain, severe nausea/vomiting, or problems with urination or bowel  movements unless otherwise stated above. ?Pertinent History Reviewed:  ?Reviewed past medical,surgical, social, obstetrical and family history.  ?Reviewed problem list, medications and allergies. ?Physical Assessment:  ? ?Vitals:  ? 09/04/21 1152  ?BP: 125/87  ?Pulse: 87  ?Weight: 227 lb (103 kg)  ?Body mass index is 36.64 kg/m?. ?     ?     Physical Examination:  ? General appearance: alert, well appearing, and in no distress ? Mental status: alert, oriented to person, place, and time ? Skin: warm & dry  ? Extremities: Edema: Mild pitting, slight indentation  ?  Cardiovascular: normal heart rate noted ? Respiratory: normal respiratory effort, no distress ? Abdomen: gravid, soft, non-tender ? Pelvic:  spec exam: cx visually closed, mod amt yellow/white d/c, CV swab collected. 2 flesh colored, soft bumps Rt labia majora. Erythematous, tender boil vs ingrown hair Lt upper mons/groin         ? ?Fetal Status: Fetal Heart Rate (bpm): 158 Fundal Height: 34 cm Movement: Present   ? ?Fetal Surveillance Testing today:   ? ?Chaperone: Glenard Haring [place]   ? ?Results for orders placed or performed in visit on 09/04/21 (from the past 24 hour(s))  ?POC Urinalysis Dipstick OB  ? Collection Time: 09/04/21 11:56 AM  ?  Result Value Ref Range  ? Color, UA    ? Clarity, UA    ? Glucose, UA Negative Negative  ? Bilirubin, UA    ? Ketones, UA neg   ? Spec Grav, UA    ? Blood, UA neg   ? pH, UA    ? POC,PROTEIN,UA Small (1+) Negative, Trace, Small (1+), Moderate (2+), Large (3+), 4+  ? Urobilinogen, UA    ? Nitrite, UA neg   ? Leukocytes, UA Negative Negative  ? Appearance    ? Odor    ?  ?Assessment & Plan:  ?High-risk pregnancy: G1P0000 at [redacted]w[redacted]d with an Estimated Date of Delivery: 10/05/21  ? ?1) A1DM, stable, EFW next week as scheduled ? ?2) Boil vs ingrown hair, erythematous and tender, rx keflex, f/u next week ? ?3) Borderline DBP> asymptomatic, tr proteinuria. Check bp bid, if >140/90 or pre-e s/s, call us/go to [facility] if we're closed ? ?4)  Vaginal d/c> CV swab ? ?5) Breech> at 33.4wks, [email or web address], u/s next week ? ?Meds:  ?Meds ordered this encounter  ?Medications  ? cephALEXin (KEFLEX) 500 MG capsule  ?  Sig: Take 1 capsule (500 mg total) by mouth 4 (four) times daily. X 7 days  ?  Dispense:  28 capsule  ?  Refill:  0  ?  Order Specific Question:   Supervising Provider  ?  Answer:   Tania Ade H [[id number]]  ? ? ?Labs/procedures today: spec exam and CV swab ? ?Treatment Plan:    No antenatal testing as long as remains off meds     Deliver @ 39-40wks:____ ? ? ?Reviewed: Preterm labor symptoms and general obstetric precautions including but not limited to vaginal bleeding, contractions, leaking of fluid and fetal movement were reviewed in detail with the patient.  All questions were answered. Does have home bp cuff. Office bp cuff given: not applicable. Check bp twice daily, let us know if consistently >140 and/or >90. ? ?Follow-up: Return for As scheduled. ? ? ?Future Appointments  ?Date Time Provider [place]  ?09/11/2021 10:45 AM [facility] [place] [facility] None  ?09/11/2021 11:50 AM Janyth Pupa, DO [facility]  ?09/17/2021  8:50 AM Roma Schanz, CNM [facility]  ?09/25/2021  8:30 AM Roma Schanz, CNM [facility]  ?10/02/2021 10:10 AM Roma Schanz, CNM [facility]  ? ? ?Orders Placed This Encounter  ?Procedures  ? US OB Follow Up  ? POC Urinalysis Dipstick OB  ? ?[place], [place] ?09/04/2021 ?12:35 PM  ?

## 2021-09-04 NOTE — Patient Instructions (Signed)
[place], thank you for choosing our office today! We appreciate the opportunity to meet your healthcare needs. You may receive a short survey by mail, e-mail, or through [employer]. If you are happy with your care we would appreciate if you could take just a few minutes to complete the survey questions. We read all of your comments and take your feedback very seriously. Thank you again for choosing our office.  ?Center for [employer] Team at [facility] ? ?Women's & [place] at [facility] ?([address] [place],  [address]) ?Entrance C, located off of [address] ?Free 24/7 valet parking  ? ?[email or web address] ? ?CLASSES: Go to [email or web address] to register for classes (childbirth, breastfeeding, waterbirth, infant CPR, daddy bootcamp, etc.) ? ?Call the office [phone]) or go to [facility] if: ?You begin to have strong, frequent contractions ?Your water breaks.  Sometimes it is a big gush of fluid, sometimes it is just a trickle that keeps getting your panties wet or running down your legs ?You have vaginal bleeding.  It is normal to have a small amount of spotting if your cervix was checked.  ?You don't feel your baby moving like normal.  If you don't, get you something to eat and drink and lay down and focus on feeling your baby move.   If your baby is still not moving like normal, you should call the office or go to [facility]. ? ?Call the office [phone]) or go to [facility] hospital for these signs of pre-eclampsia: ?Severe headache that does not go away with Tylenol ?Visual changes- seeing spots, double, blurred vision ?Pain under your right breast or upper abdomen that does not go away with Tums or heartburn medicine ?Nausea and/or vomiting ?Severe swelling in your hands, feet, and face  ? ?Tdap Vaccine ?It is recommended that you get the Tdap vaccine during the third trimester of EACH pregnancy to help protect your baby from getting pertussis (whooping cough) ?27-36 weeks is  the BEST time to do this so that you can pass the protection on to your baby. During pregnancy is better than after pregnancy, but if you are unable to get it during pregnancy it will be offered at the hospital.  ?You can get this vaccine with [place], at the health department, your family doctor, or some local pharmacies ?Everyone who will be around your baby should also be up-to-date on their vaccines before the baby comes. Adults (who are not pregnant) only need 1 dose of Tdap during adulthood.  ? ?[place] Pediatricians/Family Doctors ?[facility]): [address] [place], [phone]           ?[facility]: [address], [phone]                ?[facility]): [place], [phone] (call to ask if accepting patients) ?[facility] Department: [address], [place], [place]   ? ?[place] Pediatricians/Family Doctors ?[facility]): [address], [address], [phone] ?[place]: [address] [place], [phone] ?[facility] [place]: [place], [phone] ? ?[place]  ?[facility]): [phone] ?[facility]: [place], [phone]  ? ?[place] ?[place]: [place] [address], [phone] ? ?[place]  ?[facility]: [phone], [phone] ? ?Home Blood Pressure Monitoring for Patients  ? ?Your provider has recommended that you  check your blood pressure (BP) at least once a week at home. If you do not have a blood pressure cuff at home, one will be provided for you. Contact your provider if you have not received your monitor within 1 week.  ? ?Helpful Tips for Accurate Home Blood Pressure Checks  ?Don't smoke, exercise, or drink caffeine 30 minutes before checking your BP ?Use the restroom before checking your BP (a full  bladder can raise your pressure) ?Relax in a comfortable upright chair ?Feet on the ground ?Left arm resting comfortably on a flat surface at the level of your heart ?Legs uncrossed ?Back supported ?Sit quietly and don't talk ?Place the cuff on your bare arm ?Adjust snuggly, so that only two fingertips can fit between your skin and the top of the cuff ?Check 2 readings separated by at least one minute ?Keep a log of your BP readings ?For a visual, please reference this diagram: http://ccnc.care/bpdiagram ? ?Provider Name: [facility]     Phone: [phone] ? ?Zone 1: ALL CLEAR  ?Continue to monitor your symptoms:  ?BP reading is less than 140 (top number) or less than 90 (bottom number)  ?No right upper stomach pain ?No headaches or seeing spots ?No feeling nauseated or throwing up ?No swelling in face and hands ? ?Zone 2: CAUTION ?Call your doctor's office for any of the following:  ?BP reading is greater than 140 (top number) or greater than 90 (bottom number)  ?Stomach pain under your ribs in the middle or right side ?Headaches or seeing spots ?Feeling nauseated or throwing up ?Swelling in face and hands ? ?Zone 3: EMERGENCY  ?Seek immediate medical care if you have any of the following:  ?BP reading is greater than160 (top number) or greater than 110 (bottom number) ?Severe headaches not improving with Tylenol ?Serious difficulty catching your breath ?Any worsening symptoms from Zone 2  ?Preterm Labor and Birth Information ? ?The normal length of a pregnancy is 39-41 weeks. Preterm labor is when labor starts before 37 completed weeks of pregnancy. ?What are the risk factors for preterm labor? ?Preterm labor is more likely to occur in women who: ?Have certain infections during pregnancy such as a bladder infection, sexually transmitted infection, or infection inside the uterus (chorioamnionitis). ?Have a shorter-than-normal cervix. ?Have gone into preterm labor before. ?Have had surgery on their  cervix. ?Are younger than age 53 or older than age 76. ?Are African American. ?Are pregnant with twins or multiple babies (multiple gestation). ?Take street drugs or smoke while pregnant. ?Do not gain enough weight while pregnant. ?Became pregnant shortly after having been pregnant. ?What are the symptoms of preterm labor? ?Symptoms of preterm labor include: ?Cramps similar to those that can happen during a menstrual period. The cramps may happen with diarrhea. ?Pain in the abdomen or lower back. ?Regular uterine contractions that may feel like tightening of the abdomen. ?A feeling of increased pressure in the pelvis. ?Increased watery or bloody mucus discharge from the vagina. ?Water breaking (ruptured amniotic sac). ?Why is it important to recognize signs of preterm labor? ?It is important to recognize signs of preterm labor because babies who are born prematurely may not be fully developed. This can put them at an increased risk for: ?Long-term (chronic) heart and lung problems. ?Difficulty immediately after birth with regulating body systems, including blood sugar, body temperature, heart rate, and breathing rate. ?Bleeding in the brain. ?Cerebral palsy. ?Learning difficulties. ?Death. ?These risks are highest for babies who are born before  34 weeks of pregnancy. ?How is preterm labor treated? ?Treatment depends on the length of your pregnancy, your condition, and the health of your baby. It may involve: ?Having a stitch (suture) placed in your cervix to prevent your cervix from opening too early (cerclage). ?Taking or being given medicines, such as: ?Hormone medicines. These may be given early in pregnancy to help support the pregnancy. ?Medicine to stop contractions. ?Medicines to help mature the baby?s lungs. These may be prescribed if the risk of delivery is high. ?Medicines to prevent your baby from developing cerebral palsy. ?If the labor happens before 34 weeks of pregnancy, you may need to stay in the  hospital. ?What should I do if I think I am in preterm labor? ?If you think that you are going into preterm labor, call your health care provider right away. ?How can I prevent preterm labor in future pregnancies? ?T

## 2021-09-06 ENCOUNTER — Encounter: Payer: Self-pay | Admitting: Women's Health

## 2021-09-06 LAB — CERVICOVAGINAL ANCILLARY ONLY
Bacterial Vaginitis (gardnerella): NEGATIVE
Candida Glabrata: NEGATIVE
Candida Vaginitis: NEGATIVE
Chlamydia: NEGATIVE
Comment: NEGATIVE
Comment: NEGATIVE
Comment: NEGATIVE
Comment: NEGATIVE
Comment: NEGATIVE
Comment: NORMAL
Neisseria Gonorrhea: NEGATIVE
Trichomonas: NEGATIVE

## 2021-09-07 ENCOUNTER — Other Ambulatory Visit: Payer: Self-pay

## 2021-09-07 ENCOUNTER — Ambulatory Visit (INDEPENDENT_AMBULATORY_CARE_PROVIDER_SITE_OTHER): Payer: 59

## 2021-09-07 VITALS — BP 124/87 | HR 94 | Wt 227.8 lb

## 2021-09-07 DIAGNOSIS — Z0130 Encounter for examination of blood pressure without abnormal findings: Secondary | ICD-10-CM

## 2021-09-07 DIAGNOSIS — O0990 Supervision of high risk pregnancy, unspecified, unspecified trimester: Secondary | ICD-10-CM

## 2021-09-07 DIAGNOSIS — O0993 Supervision of high risk pregnancy, unspecified, third trimester: Secondary | ICD-10-CM

## 2021-09-07 LAB — POCT URINALYSIS DIPSTICK OB
Blood, UA: NEGATIVE
Glucose, UA: NEGATIVE
Ketones, UA: NEGATIVE
Leukocytes, UA: NEGATIVE
Nitrite, UA: NEGATIVE

## 2021-09-07 NOTE — Progress Notes (Signed)
? ?  NURSE VISIT- BLOOD PRESSURE CHECK ? ?SUBJECTIVE:  ?Susan Chandler is a 30 y.o. G88P0000 female here for BP check. She is [redacted]w[redacted]d pregnant   ? ?HYPERTENSION ROS: ? ?Pregnant:  ?Severe headaches that don't go away with tylenol/other medicines: No  ?Visual changes (seeing spots/double/blurred vision) No  ?Severe pain under right breast breast or in center of upper chest No  ?Severe nausea/vomiting No  ?Taking medicines as instructed Not taking ASA ? ?OBJECTIVE:  ?BP 124/87   Pulse 94   Wt 227 lb 12.8 oz (103.3 kg)   LMP 12/20/2020 (Exact Date)   BMI 36.77 kg/m?   ?Appearance alert, well appearing, and in no distress, oriented to person, place, and time, and normal appearing weight. ? ?ASSESSMENT: ?Pregnancy [redacted]w[redacted]d  blood pressure check ? ?PLAN: ?Discussed with Joellyn Haff, CNM, WHNP   ?Recommendations: Stop taking Sudafed for stuffy nose, take Mucinex instead, keep monitoring BP's at home ?Follow-up: as scheduled  ? ?Daschel Roughton A Raphael Fitzpatrick  ?09/07/2021 ?10:08 AM  ?

## 2021-09-09 ENCOUNTER — Encounter: Payer: Self-pay | Admitting: Women's Health

## 2021-09-11 ENCOUNTER — Ambulatory Visit (INDEPENDENT_AMBULATORY_CARE_PROVIDER_SITE_OTHER): Payer: 59 | Admitting: Obstetrics & Gynecology

## 2021-09-11 ENCOUNTER — Telehealth (HOSPITAL_COMMUNITY): Payer: Self-pay | Admitting: *Deleted

## 2021-09-11 ENCOUNTER — Other Ambulatory Visit: Payer: Self-pay

## 2021-09-11 ENCOUNTER — Ambulatory Visit (INDEPENDENT_AMBULATORY_CARE_PROVIDER_SITE_OTHER): Payer: 59

## 2021-09-11 VITALS — BP 130/80 | HR 93 | Wt 229.0 lb

## 2021-09-11 DIAGNOSIS — O0993 Supervision of high risk pregnancy, unspecified, third trimester: Secondary | ICD-10-CM

## 2021-09-11 DIAGNOSIS — O133 Gestational [pregnancy-induced] hypertension without significant proteinuria, third trimester: Secondary | ICD-10-CM

## 2021-09-11 DIAGNOSIS — O321XX0 Maternal care for breech presentation, not applicable or unspecified: Secondary | ICD-10-CM

## 2021-09-11 DIAGNOSIS — O24410 Gestational diabetes mellitus in pregnancy, diet controlled: Secondary | ICD-10-CM

## 2021-09-11 DIAGNOSIS — Z3A36 36 weeks gestation of pregnancy: Secondary | ICD-10-CM

## 2021-09-11 DIAGNOSIS — O0990 Supervision of high risk pregnancy, unspecified, unspecified trimester: Secondary | ICD-10-CM

## 2021-09-11 LAB — POCT URINALYSIS DIPSTICK OB
Blood, UA: NEGATIVE
Glucose, UA: NEGATIVE
Ketones, UA: NEGATIVE
Leukocytes, UA: NEGATIVE
Nitrite, UA: NEGATIVE

## 2021-09-11 NOTE — Progress Notes (Signed)
? ?HIGH-RISK PREGNANCY VISIT ?Patient name: Susan Chandler MRN [id number]  Date of birth: 20-Dec-1990 ?Chief Complaint:   ?Routine Prenatal Visit ? ?History of Present Illness:   ?Susan Chandler is a 31 y.o. G82P0000 female at 75w4dwith an Estimated Date of Delivery: 10/05/21 being seen today for ongoing management of a high-risk pregnancy complicated by ? ?-GDMA1- well controlled ?-GestHTN- diagnosed today.  Currently asymptomatic.   ?-FRedlandspresentation ? ?Today she reports no acute complaints.  Some mild swelling that improves with rest.  ? ?Contractions: Not present. Vag. Bleeding: None.  Movement: Present. denies leaking of fluid.  ? ?[facility] 07/26/2021 07/10/2021 04/05/2021 01/26/2021 09/18/2020  ?Decreased Interest 0 0 0 1 1  ?Down, Depressed, Hopeless 0 0 0 1 1  ?PHQ - 2 Score 0 0 0 2 2  ?Altered sleeping - 1 0 1 1  ?Tired, decreased energy - 0 0 1 1  ?Change in appetite - 1 0 1 1  ?Feeling bad or failure about yourself  - 0 0 1 0  ?Trouble concentrating - 0 0 0 1  ?Moving slowly or fidgety/restless - 0 0 0 0  ?Suicidal thoughts - 0 0 0 0  ?PHQ-9 Score - 2 0 6 6  ?Difficult doing work/chores - - - Somewhat difficult -  ?Some recent data might be hidden  ? ? ? ?Current Outpatient Medications  ?Medication Instructions  ? acetaminophen (TYLENOL) 1,000 mg, Oral, Every 8 hours PRN  ? aspirin 81 mg, Oral, Daily, Swallow whole.  ? Blood Glucose Monitoring Suppl (ONETOUCH VERIO REFLECT) w/Device KIT No dose, route, or frequency recorded.  ? cephALEXin (KEFLEX) 500 mg, Oral, 4 times daily, X 7 days  ? fluticasone (FLONASE) 50 MCG/ACT nasal spray Each Nare, Daily PRN  ? Lancets (ONETOUCH DELICA PLUS [id number] MISC Topical  ? ONETOUCH VERIO test strip 4 times daily  ? Prenatal Vit-Fe Fumarate-FA (MULTIVITAMIN-PRENATAL) 27-0.8 MG TABS tablet 1 tablet, Oral, Daily  ?  ? ?Review of Systems:   ?Pertinent items are noted in HPI ?Denies abnormal vaginal discharge w/ itching/odor/irritation, headaches,  visual changes, shortness of breath, chest pain, abdominal pain, severe nausea/vomiting, or problems with urination or bowel movements unless otherwise stated above. ?Pertinent History Reviewed:  ?Reviewed past medical,surgical, social, obstetrical and family history.  ?Reviewed problem list, medications and allergies. ?Physical Assessment:  ? ?Vitals:  ? 09/11/21 1157 09/11/21 1158  ?BP: (!) 144/85 130/80  ?Pulse: 93   ?Weight: 229 lb (103.9 kg)   ?Body mass index is 36.96 kg/m?. ?     ?     Physical Examination:  ? General appearance: alert, well appearing, and in no distress ? Mental status: normal mood, behavior, speech, dress, motor activity, and thought processes ? Skin: warm & dry  ? Extremities: Edema: Mild pitting, slight indentation  ?  Cardiovascular: normal heart rate noted ? Respiratory: normal respiratory effort, no distress ? Abdomen: gravid, soft, non-tender ? Pelvic: Cervical exam deferred        ? ?Fetal Status:     Movement: Present   ? ?Fetal Surveillance Testing today: Growth scan-   ?frank breech,anterior placenta gr 3,AFI 11 cm,FHR 164 bpm,EFW 3339 g 85% ? ?Chaperone: N/A   ? ?Results for orders placed or performed in visit on 09/11/21 (from the past 24 hour(s))  ?POC Urinalysis Dipstick OB  ? Collection Time: 09/11/21 11:53 AM  ?Result Value Ref Range  ? Color, UA    ? Clarity, UA    ?  Glucose, UA Negative Negative  ? Bilirubin, UA    ? Ketones, UA neg   ? Spec Grav, UA    ? Blood, UA neg   ? pH, UA    ? POC,PROTEIN,UA Small (1+) Negative, Trace, Small (1+), Moderate (2+), Large (3+), 4+  ? Urobilinogen, UA    ? Nitrite, UA neg   ? Leukocytes, UA Negative Negative  ? Appearance    ? Odor    ?  ? ?Assessment & Plan:  ?High-risk pregnancy: G1P0000 at 65w4dwith an Estimated Date of Delivery: 10/05/21  ? ?1) Gestational HTN ?-discussed early delivery ?-reviewed preeclampsia precautions ?-baseline labwork today ?-continue home BP check ?-start antepartum testing ? ?2) Fetal  malpresentation ?-reviewed UKoreafindings and discussed options regarding ECV vs primary C-section ?-pt prefers primary C-section ? ?3) GDMA1 ?-well controlled ? ?Scheduled for primary C-section on 3/27- pt will be 37+weeks at that time ? ?Meds: No orders of the defined types were placed in this encounter. ? ? ?Labs/procedures today: CBC, CMP, PC ratio, GBS collected ? ?Treatment Plan:  As above- antepartum testing with plan for delivery between 37-38wk ? ?Reviewed: Term labor symptoms and general obstetric precautions including but not limited to vaginal bleeding, contractions, leaking of fluid and fetal movement were reviewed in detail with the patient.  All questions were answered. Pt has home bp cuff. Check bp weekly, let uKoreaknow if >160/100.  ? ?Follow-up: Return for needs NST later this week and then next week twice weekly. ? ? ?Future Appointments  ?Date Time Provider [place] ?09/14/2021  9:50 AM [facility] NURSE [facility]  ?09/18/2021 11:10 AM BRoma Schanz CNM [facility]  ?09/21/2021 10:10 AM [facility] NURSE [facility]  ?09/25/2021  9:30 AM Eure, LMertie Clause MD [facility]  ? ? ?Orders Placed This Encounter  ?Procedures  ? Culture, beta strep (group b only)  ? Comprehensive metabolic panel  ? CBC  ? Protein / creatinine ratio, urine  ? POC Urinalysis Dipstick OB  ? ? ?JJanyth Pupa DO ?[facility]ending [facility] ?[facility] for [place]? ? ? ?

## 2021-09-11 NOTE — Patient Instructions (Signed)
Susan Chandler ? 09/11/2021 ? ? Your procedure is scheduled on:  09/17/2021 ? Arrive at [employer] at [employer] on [employer] [address] at [facility]  and [employer]. You are invited to use the FREE valet parking or use the Visitor's parking deck. ? Pick up the phone at the desk and dial [phone]. ? Call this number if you have problems the morning of surgery: [phone] ? Remember: ? ? Do not eat food:(After Midnight) Desp?s de medianoche. ? Do not drink clear liquids: (After Midnight) Desp?s de medianoche. ? Take these medicines the morning of surgery with A SIP OF WATER:  none ? ? Do not wear jewelry, make-up or nail polish. ? Do not wear lotions, powders, or perfumes. Do not wear deodorant. ? Do not shave 48 hours prior to surgery. ? Do not bring valuables to the hospital.  [facility] is not  ? responsible for any belongings or valuables brought to the hospital. ? Contacts, dentures or bridgework may not be worn into surgery. ? Leave suitcase in the car. After surgery it may be brought to your room. ? For patients admitted to the hospital, checkout time is 11:00 AM the day of  ?            discharge. ? ?   ? Please read over the following fact sheets that you were given:  ?   Preparing for Surgery ? ? ?

## 2021-09-11 NOTE — Progress Notes (Signed)
[place] 36+4 wks,frank breech,anterior placenta gr 3,AFI 11 cm,FHR 164 bpm,EFW 3339 g 85% ?

## 2021-09-11 NOTE — Telephone Encounter (Signed)
Preadmission screen  

## 2021-09-12 ENCOUNTER — Encounter (HOSPITAL_COMMUNITY): Payer: Self-pay

## 2021-09-12 ENCOUNTER — Encounter (HOSPITAL_COMMUNITY): Payer: Self-pay | Admitting: Obstetrics and Gynecology

## 2021-09-12 ENCOUNTER — Telehealth (HOSPITAL_COMMUNITY): Payer: Self-pay | Admitting: *Deleted

## 2021-09-12 ENCOUNTER — Inpatient Hospital Stay (HOSPITAL_COMMUNITY)
Admission: AD | Admit: 2021-09-12 | Discharge: 2021-09-12 | Disposition: A | Discharge status: Home / Self Care | Payer: 59 | Attending: Obstetrics and Gynecology | Admitting: Obstetrics and Gynecology

## 2021-09-12 ENCOUNTER — Encounter: Payer: Self-pay | Admitting: Obstetrics & Gynecology

## 2021-09-12 DIAGNOSIS — O133 Gestational [pregnancy-induced] hypertension without significant proteinuria, third trimester: Secondary | ICD-10-CM | POA: Insufficient documentation

## 2021-09-12 DIAGNOSIS — Z3A36 36 weeks gestation of pregnancy: Secondary | ICD-10-CM | POA: Insufficient documentation

## 2021-09-12 DIAGNOSIS — R030 Elevated blood-pressure reading, without diagnosis of hypertension: Secondary | ICD-10-CM | POA: Diagnosis present

## 2021-09-12 LAB — CBC
HCT: 35.2 % — ABNORMAL LOW (ref 36.0–46.0)
Hematocrit: 39.4 % (ref 34.0–46.6)
Hemoglobin: 13.8 g/dL (ref 11.1–15.9)
Hemoglobin: 12.6 g/dL (ref 12.0–15.0)
MCH: 32.6 pg (ref 26.6–33.0)
MCH: 33 pg (ref 26.0–34.0)
MCHC: 35 g/dL (ref 31.5–35.7)
MCHC: 35.8 g/dL (ref 30.0–36.0)
MCV: 93 fL (ref 79–97)
MCV: 92.1 fL (ref 80.0–100.0)
Platelets: 295 10*3/uL (ref 150–450)
Platelets: 266 10*3/uL (ref 150–400)
RBC: 4.23 x10E6/uL (ref 3.77–5.28)
RBC: 3.82 MIL/uL — ABNORMAL LOW (ref 3.87–5.11)
RDW: 12.1 % (ref 11.7–15.4)
RDW: 12.5 % (ref 11.5–15.5)
WBC: 10.6 10*3/uL (ref 3.4–10.8)
WBC: 9.1 10*3/uL (ref 4.0–10.5)
nRBC: 0 % (ref 0.0–0.2)

## 2021-09-12 LAB — COMPREHENSIVE METABOLIC PANEL
ALT: 10 IU/L (ref 0–32)
ALT: 14 U/L (ref 0–44)
AST: 14 IU/L (ref 0–40)
AST: 20 U/L (ref 15–41)
Albumin/Globulin Ratio: 1.3 (ref 1.2–2.2)
Albumin: 3.5 g/dL — ABNORMAL LOW (ref 3.8–4.8)
Albumin: 2.4 g/dL — ABNORMAL LOW (ref 3.5–5.0)
Alkaline Phosphatase: 215 IU/L — ABNORMAL HIGH (ref 44–121)
Alkaline Phosphatase: 154 U/L — ABNORMAL HIGH (ref 38–126)
Anion gap: 8 (ref 5–15)
BUN/Creatinine Ratio: 17 (ref 9–23)
BUN: 10 mg/dL (ref 6–20)
BUN: 9 mg/dL (ref 6–20)
Bilirubin Total: <0.2 mg/dL (ref 0.0–1.2)
CO2: 16 mmol/L — ABNORMAL LOW (ref 20–29)
CO2: 19 mmol/L — ABNORMAL LOW (ref 22–32)
Calcium: 8.7 mg/dL (ref 8.7–10.2)
Calcium: 8.5 mg/dL — ABNORMAL LOW (ref 8.9–10.3)
Chloride: 110 mmol/L — ABNORMAL HIGH (ref 96–106)
Chloride: 112 mmol/L — ABNORMAL HIGH (ref 98–111)
Creatinine, Ser: 0.59 mg/dL (ref 0.57–1.00)
Creatinine, Ser: 0.66 mg/dL (ref 0.44–1.00)
GFR, Estimated: >60 mL/min (ref >60)
Globulin, Total: 2.8 g/dL (ref 1.5–4.5)
Glucose, Bld: 97 mg/dL (ref 70–99)
Glucose: 73 mg/dL (ref 70–99)
Potassium: 4.4 mmol/L (ref 3.5–5.2)
Potassium: 4.1 mmol/L (ref 3.5–5.1)
Sodium: 140 mmol/L (ref 134–144)
Sodium: 139 mmol/L (ref 135–145)
Total Bilirubin: 0.3 mg/dL (ref 0.3–1.2)
Total Protein: 6.3 g/dL (ref 6.0–8.5)
Total Protein: 5.7 g/dL — ABNORMAL LOW (ref 6.5–8.1)
eGFR: 123 mL/min/{1.73_m2} (ref >59)

## 2021-09-12 LAB — URINALYSIS, ROUTINE W REFLEX MICROSCOPIC
Bacteria, UA: NONE SEEN
Bilirubin Urine: NEGATIVE
Glucose, UA: NEGATIVE mg/dL
Hgb urine dipstick: NEGATIVE
Ketones, ur: NEGATIVE mg/dL
Nitrite: NEGATIVE
Protein, ur: 100 mg/dL — AB
Specific Gravity, Urine: 1.031 — ABNORMAL HIGH (ref 1.005–1.030)
WBC, UA: >50 WBC/hpf — ABNORMAL HIGH (ref 0–5)
pH: 5 (ref 5.0–8.0)

## 2021-09-12 LAB — PROTEIN / CREATININE RATIO, URINE
Creatinine, Urine: 223.6 mg/dL
Creatinine, Urine: 269.5 mg/dL
Protein Creatinine Ratio: 0.24 mg/mg{Cre} — ABNORMAL HIGH (ref 0.00–0.15)
Protein, Ur: 66.2 mg/dL
Protein/Creat Ratio: 296 mg/g creat — ABNORMAL HIGH (ref 0–200)
Total Protein, Urine: 64 mg/dL

## 2021-09-12 NOTE — MAU Provider Note (Signed)
?History  ?  ? ?CSN: [id number] ? ?Arrival date and time: 09/12/21 1919 ? ? Event Date/Time  ? First Provider Initiated Contact with Patient 09/12/21 2019   ?  ? ?Chief Complaint  ?Patient presents with  ? Hypertension  ? ?HPI ?Susan Chandler is a 31 y.o. G1P0000 at 6w5dwho presents with elevated blood pressures. She was diagnosed with gestational hypertension this week and instructed to take her blood pressures twice a day. She reports she had 2 readings in the 150s today which is the highest it has ever been so she came in. She denies any HA, visual changes or epigastric pain. She is scheduled for delivery on Monday. ? ?OB History   ? ? Gravida  ?1  ? Para  ?0  ? Term  ?0  ? Preterm  ?0  ? AB  ?0  ? Living  ?0  ?  ? ? SAB  ?0  ? IAB  ?0  ? Ectopic  ?0  ? Multiple  ?0  ? Live Births  ?0  ?   ?  ?  ? ? ?Past Medical History:  ?Diagnosis Date  ? Abdominal pain   ? Persistent Mar/Apr 2020: w/u pretty unrevealing by me ([place]); pt to see GI 10/21/18.  ? Abnormal uterine bleeding   ? Dr. EElonda Husky Nuvaring as of 09/2019  ? Bipolar disorder ([place]   ? r/o OCD per psychiatrist 03/2016.  Also, psychologist eval revealed suspected Bipolar II, depressed phase 04/16/2016.  ? Chronic pain syndrome   ? Dysmenorrhea   ? mgmt per Dr. EElonda Husky(01/2018).  Nuva Ring vag inserts 07/2018.  ? Erosive gastropathy 10/2018  ? Nonbleeding (EGD 10/2018)  ? GAD (generalized anxiety disorder)   ? Gestational diabetes   ? Infectious mononucleosis 2007  ? Lumbosacral radiculopathy at L5 2015  ? [facility]; [facility] where she got ESI left L5-S1 11/2013 with moderate improvement.  Saw neurosurgeon 01/2016: surgery NOT recommended.   Severe recurrence 01/2021->rpt MRI w/out neural compression->plan for ESI L5-S1 left.  ? Migraine syndrome   ? Obesity, Class I, BMI 30-34.9   ? Pre-syncope 2016  ? ? POTS.  Holter showed mostly NSR, occ PACs, one run of nonsustained atrial tachy.  ?  Pregnancy induced hypertension   ? Salmonella enteritis 09/2017  ? Scoliosis   ? ? ?Past Surgical History:  ?Procedure Laterality Date  ? BIOPSY  10/26/2018  ? Procedure: BIOPSY;  Surgeon: RRogene Houston MD;  [facility];  Service: Endoscopy;;  ? ESOPHAGOGASTRODUODENOSCOPY  10/2018  ? Nonbleeding erosive gastropathy, H pylori NEG.  ? ESOPHAGOGASTRODUODENOSCOPY (EGD) WITH PROPOFOL N/A 10/26/2018  ? Procedure: ESOPHAGOGASTRODUODENOSCOPY (EGD) WITH PROPOFOL;  Surgeon: RRogene Houston MD;  [facility];  Service: Endoscopy;  Laterality: N/A;  ? WISDOM TOOTH EXTRACTION    ? ? ?Family History  ?Problem Relation Age of Onset  ? Bipolar disorder Father   ? Bipolar disorder Sister   ? Other Sister   ?     e coli  ? Cancer Maternal Aunt   ? Alcohol abuse Maternal Grandfather   ? ? ?Social History  ? ?Tobacco Use  ? Smoking status: Never  ? Smokeless tobacco: Never  ?Vaping Use  ? Vaping Use: Never used  ?Substance Use Topics  ? Alcohol use: No  ?  Comment: 04-05-2016 per pt no  ? Drug use: No  ?  Comment: 04-05-2016  per pt no   ? ? ?Allergies:  ?Allergies  ?Allergen Reactions  ? Shellfish Allergy Nausea And Vomiting  ? ? ?Medications Prior to Admission  ?Medication Sig Dispense Refill Last Dose  ? Blood Glucose Monitoring Suppl (ONETOUCH VERIO REFLECT) w/Device KIT    09/12/2021  ? fluticasone (FLONASE) 50 MCG/ACT nasal spray Place 2 sprays into both nostrils daily.   09/12/2021  ? Lancets (ONETOUCH DELICA PLUS [id number]) MISC Apply topically.   09/12/2021  ? ONETOUCH VERIO test strip 4 (four) times daily.   09/12/2021  ? Prenatal Vit-Fe Fumarate-FA (MULTIVITAMIN-PRENATAL) 27-0.8 MG TABS tablet Take 1 tablet by mouth daily.   09/12/2021  ? acetaminophen (TYLENOL) 500 MG tablet Take 2 tablets (1,000 mg total) by mouth every 8 (eight) hours as needed for moderate pain. (Patient not taking: Reported on 09/11/2021) 30 tablet 0 Unknown  ? aspirin 81 MG EC tablet Take 1 tablet (81 mg total) by mouth daily. Swallow  whole. (Patient not taking: Reported on 08/21/2021) 90 tablet 3 Unknown  ? cephALEXin (KEFLEX) 500 MG capsule Take 1 capsule (500 mg total) by mouth 4 (four) times daily. X 7 days (Patient not taking: Reported on 09/11/2021) 28 capsule 0 Unknown  ? ? ?Review of Systems  ?Constitutional: Negative.  Negative for fatigue and fever.  ?HENT: Negative.    ?Respiratory: Negative.  Negative for shortness of breath.   ?Cardiovascular: Negative.  Negative for chest pain.  ?Gastrointestinal: Negative.  Negative for abdominal pain, constipation, diarrhea, nausea and vomiting.  ?Genitourinary: Negative.  Negative for dysuria, vaginal bleeding and vaginal discharge.  ?Neurological: Negative.  Negative for dizziness and headaches.  ?Physical Exam  ? ?Blood pressure (!) 137/92, pulse 95, temperature 97.9 ?F (36.6 ?C), temperature source Oral, resp. rate 17, height '5\' 6"'  (1.676 m), weight 104.6 kg, last menstrual period 12/20/2020, SpO2 100 %. ? ?Patient Vitals for the past 24 hrs: ? BP Temp Temp src Pulse Resp SpO2 Height Weight  ?09/12/21 2159 (!) 138/95 -- Oral 91 17 100 % -- --  ?09/12/21 2101 (!) 139/92 -- -- 80 -- -- -- --  ?09/12/21 2046 (!) 137/92 -- -- 88 -- -- -- --  ?09/12/21 2031 (!) 142/91 -- -- 87 -- -- -- --  ?09/12/21 2017 (!) 133/92 -- -- 95 -- -- -- --  ?09/12/21 1952 (!) 137/92 97.9 ?F (36.6 ?C) Oral 95 17 100 % '5\' 6"'  (1.676 m) 104.6 kg  ?09/12/21 1951 -- -- -- -- -- 100 % -- --  ? ? ? ?Physical Exam ?Vitals and nursing note reviewed.  ?Constitutional:   ?   General: She is not in acute distress. ?   Appearance: She is well-developed.  ?HENT:  ?   Head: Normocephalic.  ?Eyes:  ?   Pupils: Pupils are equal, round, and reactive to light.  ?Cardiovascular:  ?   Rate and Rhythm: Normal rate and regular rhythm.  ?   Heart sounds: Normal heart sounds.  ?Pulmonary:  ?   Effort: Pulmonary effort is normal. No respiratory distress.  ?   Breath sounds: Normal breath sounds.  ?Abdominal:  ?   General: Bowel sounds are normal.  There is no distension.  ?   Palpations: Abdomen is soft.  ?   Tenderness: There is no abdominal tenderness.  ?Skin: ?   General: Skin is warm and dry.  ?Neurological:  ?   Mental Status: She is alert and oriented to person, place, and time.  ?Psychiatric:     ?  Mood and Affect: Mood normal.     ?   Behavior: Behavior normal.     ?   Thought Content: Thought content normal.     ?   Judgment: Judgment normal.  ? ?Fetal Tracing: ? ?Baseline: 140 ?Variability: moderate ?Accels: 15x15 ?Decels: none ? ?Toco: none ? ? ?MAU Course  ?Procedures ?Results for orders placed or performed during the hospital encounter of 09/12/21 (from the past 24 hour(s))  ?CBC     Status: Abnormal  ? Collection Time: 09/12/21  8:20 PM  ?Result Value Ref Range  ? WBC 9.1 4.0 - 10.5 K/uL  ? RBC 3.82 (L) 3.87 - 5.11 MIL/uL  ? Hemoglobin 12.6 12.0 - 15.0 g/dL  ? HCT 35.2 (L) 36.0 - 46.0 %  ? MCV 92.1 80.0 - 100.0 fL  ? MCH 33.0 26.0 - 34.0 pg  ? MCHC 35.8 30.0 - 36.0 g/dL  ? RDW 12.5 11.5 - 15.5 %  ? Platelets 266 150 - 400 K/uL  ? nRBC 0.0 0.0 - 0.2 %  ?Comprehensive metabolic panel     Status: Abnormal  ? Collection Time: 09/12/21  8:20 PM  ?Result Value Ref Range  ? Sodium 139 135 - 145 mmol/L  ? Potassium 4.1 3.5 - 5.1 mmol/L  ? Chloride 112 (H) 98 - 111 mmol/L  ? CO2 19 (L) 22 - 32 mmol/L  ? Glucose, Bld 97 70 - 99 mg/dL  ? BUN 9 6 - 20 mg/dL  ? Creatinine, Ser 0.66 0.44 - 1.00 mg/dL  ? Calcium 8.5 (L) 8.9 - 10.3 mg/dL  ? Total Protein 5.7 (L) 6.5 - 8.1 g/dL  ? Albumin 2.4 (L) 3.5 - 5.0 g/dL  ? AST 20 15 - 41 U/L  ? ALT 14 0 - 44 U/L  ? Alkaline Phosphatase 154 (H) 38 - 126 U/L  ? Total Bilirubin 0.3 0.3 - 1.2 mg/dL  ? GFR, Estimated >60 >60 mL/min  ? Anion gap 8 5 - 15  ?Urinalysis, Routine w reflex microscopic     Status: Abnormal  ? Collection Time: 09/12/21  8:51 PM  ?Result Value Ref Range  ? Color, Urine AMBER (A) YELLOW  ? APPearance HAZY (A) CLEAR  ? Specific Gravity, Urine 1.031 (H) 1.005 - 1.030  ? pH 5.0 5.0 - 8.0  ? Glucose, UA  NEGATIVE NEGATIVE mg/dL  ? Hgb urine dipstick NEGATIVE NEGATIVE  ? Bilirubin Urine NEGATIVE NEGATIVE  ? Ketones, ur NEGATIVE NEGATIVE mg/dL  ? Protein, ur 100 (A) NEGATIVE mg/dL  ? Nitrite NEGATIVE NEGATIVE

## 2021-09-12 NOTE — Discharge Instructions (Signed)

## 2021-09-12 NOTE — Telephone Encounter (Signed)
Preadmission screen  

## 2021-09-14 ENCOUNTER — Ambulatory Visit (INDEPENDENT_AMBULATORY_CARE_PROVIDER_SITE_OTHER): Payer: 59 | Admitting: *Deleted

## 2021-09-14 ENCOUNTER — Encounter (HOSPITAL_COMMUNITY)
Admission: RE | Admit: 2021-09-14 | Discharge: 2021-09-14 | Disposition: A | Discharge status: Home / Self Care | Payer: 59 | Source: Ambulatory Visit | Attending: Obstetrics & Gynecology | Admitting: Obstetrics & Gynecology

## 2021-09-14 ENCOUNTER — Other Ambulatory Visit: Payer: Self-pay

## 2021-09-14 VITALS — BP 130/92 | HR 92 | Wt 231.0 lb

## 2021-09-14 DIAGNOSIS — Z01812 Encounter for preprocedural laboratory examination: Secondary | ICD-10-CM | POA: Insufficient documentation

## 2021-09-14 DIAGNOSIS — Z3A00 Weeks of gestation of pregnancy not specified: Secondary | ICD-10-CM | POA: Insufficient documentation

## 2021-09-14 DIAGNOSIS — O133 Gestational [pregnancy-induced] hypertension without significant proteinuria, third trimester: Secondary | ICD-10-CM

## 2021-09-14 DIAGNOSIS — O0990 Supervision of high risk pregnancy, unspecified, unspecified trimester: Secondary | ICD-10-CM

## 2021-09-14 DIAGNOSIS — O320XX0 Maternal care for unstable lie, not applicable or unspecified: Secondary | ICD-10-CM | POA: Insufficient documentation

## 2021-09-14 DIAGNOSIS — O139 Gestational [pregnancy-induced] hypertension without significant proteinuria, unspecified trimester: Secondary | ICD-10-CM | POA: Insufficient documentation

## 2021-09-14 DIAGNOSIS — O24410 Gestational diabetes mellitus in pregnancy, diet controlled: Secondary | ICD-10-CM

## 2021-09-14 HISTORY — DX: Gestational (pregnancy-induced) hypertension without significant proteinuria, unspecified trimester: O13.9

## 2021-09-14 HISTORY — DX: Gestational diabetes mellitus in pregnancy, unspecified control: O24.419

## 2021-09-14 LAB — CBC
HCT: 36.4 % (ref 36.0–46.0)
Hemoglobin: 12.7 g/dL (ref 12.0–15.0)
MCH: 32.3 pg (ref 26.0–34.0)
MCHC: 34.9 g/dL (ref 30.0–36.0)
MCV: 92.6 fL (ref 80.0–100.0)
Platelets: 266 10*3/uL (ref 150–400)
RBC: 3.93 MIL/uL (ref 3.87–5.11)
RDW: 12.4 % (ref 11.5–15.5)
WBC: 10.3 10*3/uL (ref 4.0–10.5)
nRBC: 0 % (ref 0.0–0.2)

## 2021-09-14 LAB — POCT URINALYSIS DIPSTICK OB
Blood, UA: NEGATIVE
Glucose, UA: NEGATIVE
Ketones, UA: NEGATIVE
Leukocytes, UA: NEGATIVE
Nitrite, UA: NEGATIVE

## 2021-09-14 LAB — TYPE AND SCREEN
ABO/RH(D): A POS
Antibody Screen: NEGATIVE

## 2021-09-14 LAB — RPR: RPR Ser Ql: NONREACTIVE

## 2021-09-14 LAB — CULTURE, BETA STREP (GROUP B ONLY): Strep Gp B Culture: POSITIVE — AB

## 2021-09-14 NOTE — Progress Notes (Signed)
? ?  NURSE VISIT- NST ? ?SUBJECTIVE:  ?Susan Chandler is a 31 y.o. G1P0000 female at [redacted]w[redacted]d, here for a NST for pregnancy complicated by [facility] and gestational diabetes.  She reports active fetal movement, contractions: none, vaginal bleeding: none, membranes: intact.  ? ?OBJECTIVE:  ?BP (!) 130/92   Pulse 92   Wt 231 lb (104.8 kg)   LMP 12/20/2020 (Exact Date)   BMI 37.28 kg/m?   ?Appears well, no apparent distress ? ?Results for orders placed or performed in visit on 09/14/21 (from the past 24 hour(s))  ?POC Urinalysis Dipstick OB  ? Collection Time: 09/14/21 10:48 AM  ?Result Value Ref Range  ? Color, UA    ? Clarity, UA    ? Glucose, UA Negative Negative  ? Bilirubin, UA    ? Ketones, UA neg   ? Spec Grav, UA    ? Blood, UA neg   ? pH, UA    ? POC,PROTEIN,UA Small (1+) Negative, Trace, Small (1+), Moderate (2+), Large (3+), 4+  ? Urobilinogen, UA    ? Nitrite, UA neg   ? Leukocytes, UA Negative Negative  ? Appearance    ? Odor    ? ? ?NST: FHR baseline 135 bpm, Variability: moderate, Accelerations:present, Decelerations:  Absent= Cat 1/reactive ?Toco: none  ? ?ASSESSMENT: ?G1P0000 at [redacted]w[redacted]d with GHTN and GDM ?NST reactive ? ?PLAN: ?EFM strip reviewed by Dr. Nelda Marseille   ?Recommendations: keep next appointment as scheduled   ? ?Kristeen Miss Sarp Vernier  ?09/14/2021 ?10:48 AM ? ?

## 2021-09-17 ENCOUNTER — Encounter (HOSPITAL_COMMUNITY)
Admission: RE | Disposition: A | Discharge status: Home / Self Care | Payer: Self-pay | Source: Home / Self Care | Attending: Obstetrics & Gynecology

## 2021-09-17 ENCOUNTER — Other Ambulatory Visit: Payer: Self-pay

## 2021-09-17 ENCOUNTER — Encounter (HOSPITAL_COMMUNITY): Payer: Self-pay | Admitting: Family Medicine

## 2021-09-17 ENCOUNTER — Encounter: Payer: 59 | Admitting: Women's Health

## 2021-09-17 ENCOUNTER — Inpatient Hospital Stay (HOSPITAL_COMMUNITY): Payer: 59 | Admitting: Anesthesiology

## 2021-09-17 ENCOUNTER — Inpatient Hospital Stay (HOSPITAL_COMMUNITY)
Admission: RE | Admit: 2021-09-17 | Discharge: 2021-09-20 | DRG: 788 | Disposition: A | Discharge status: Home / Self Care | Payer: 59 | Attending: Obstetrics & Gynecology | Admitting: Obstetrics & Gynecology

## 2021-09-17 DIAGNOSIS — O321XX0 Maternal care for breech presentation, not applicable or unspecified: Secondary | ICD-10-CM

## 2021-09-17 DIAGNOSIS — O0990 Supervision of high risk pregnancy, unspecified, unspecified trimester: Secondary | ICD-10-CM

## 2021-09-17 DIAGNOSIS — O320XX0 Maternal care for unstable lie, not applicable or unspecified: Secondary | ICD-10-CM

## 2021-09-17 DIAGNOSIS — O24419 Gestational diabetes mellitus in pregnancy, unspecified control: Secondary | ICD-10-CM | POA: Diagnosis present

## 2021-09-17 DIAGNOSIS — Z3A37 37 weeks gestation of pregnancy: Secondary | ICD-10-CM

## 2021-09-17 DIAGNOSIS — F321 Major depressive disorder, single episode, moderate: Secondary | ICD-10-CM | POA: Diagnosis present

## 2021-09-17 DIAGNOSIS — O9081 Anemia of the puerperium: Secondary | ICD-10-CM | POA: Diagnosis not present

## 2021-09-17 DIAGNOSIS — O99824 Streptococcus B carrier state complicating childbirth: Secondary | ICD-10-CM | POA: Diagnosis present

## 2021-09-17 DIAGNOSIS — O134 Gestational [pregnancy-induced] hypertension without significant proteinuria, complicating childbirth: Secondary | ICD-10-CM | POA: Diagnosis present

## 2021-09-17 DIAGNOSIS — Z91013 Allergy to seafood: Secondary | ICD-10-CM | POA: Diagnosis not present

## 2021-09-17 DIAGNOSIS — Z8632 Personal history of gestational diabetes: Secondary | ICD-10-CM | POA: Diagnosis present

## 2021-09-17 DIAGNOSIS — O24420 Gestational diabetes mellitus in childbirth, diet controlled: Secondary | ICD-10-CM | POA: Diagnosis present

## 2021-09-17 DIAGNOSIS — F319 Bipolar disorder, unspecified: Secondary | ICD-10-CM | POA: Diagnosis present

## 2021-09-17 DIAGNOSIS — F411 Generalized anxiety disorder: Secondary | ICD-10-CM | POA: Diagnosis present

## 2021-09-17 DIAGNOSIS — O139 Gestational [pregnancy-induced] hypertension without significant proteinuria, unspecified trimester: Secondary | ICD-10-CM | POA: Diagnosis present

## 2021-09-17 DIAGNOSIS — O133 Gestational [pregnancy-induced] hypertension without significant proteinuria, third trimester: Principal | ICD-10-CM

## 2021-09-17 LAB — GLUCOSE, CAPILLARY
Glucose-Capillary: 79 mg/dL (ref 70–99)
Glucose-Capillary: 90 mg/dL (ref 70–99)

## 2021-09-17 SURGERY — Surgical Case
Anesthesia: Spinal | Site: Abdomen | Wound class: Clean Contaminated

## 2021-09-17 MED ORDER — NALOXONE HCL 0.4 MG/ML IJ SOLN: 0.4000 mg | INTRAMUSCULAR | Status: DC | PRN

## 2021-09-17 MED ORDER — ONDANSETRON HCL 4 MG/2ML IJ SOLN
INTRAMUSCULAR | Status: AC
  Filled 2021-09-17: qty 2

## 2021-09-17 MED ORDER — SCOPOLAMINE 1 MG/3DAYS TD PT72: 1.0000 | MEDICATED_PATCH | Freq: Once | TRANSDERMAL | Status: DC

## 2021-09-17 MED ORDER — ACETAMINOPHEN 10 MG/ML IV SOLN
INTRAVENOUS | Status: AC
  Filled 2021-09-17: qty 100

## 2021-09-17 MED ORDER — ENOXAPARIN SODIUM 60 MG/0.6ML IJ SOSY
50.0000 mg | PREFILLED_SYRINGE | INTRAMUSCULAR | Status: DC
  Administered 2021-09-18 – 2021-09-20 (×3): 50 mg via SUBCUTANEOUS
  Filled 2021-09-17 (×3): qty 0.6

## 2021-09-17 MED ORDER — LACTATED RINGERS IV SOLN: INTRAVENOUS | Status: DC

## 2021-09-17 MED ORDER — BUPIVACAINE IN DEXTROSE 0.75-8.25 % IT SOLN
INTRATHECAL | Status: DC | PRN
  Administered 2021-09-17: 1.6 mL via INTRATHECAL

## 2021-09-17 MED ORDER — SCOPOLAMINE 1 MG/3DAYS TD PT72
MEDICATED_PATCH | TRANSDERMAL | Status: DC | PRN
  Administered 2021-09-17: 1 via TRANSDERMAL

## 2021-09-17 MED ORDER — STERILE WATER FOR IRRIGATION IR SOLN
Status: DC | PRN
  Administered 2021-09-17: 1000 mL

## 2021-09-17 MED ORDER — MORPHINE SULFATE (PF) 0.5 MG/ML IJ SOLN
INTRAMUSCULAR | Status: DC | PRN
  Administered 2021-09-17: 150 ug via INTRATHECAL

## 2021-09-17 MED ORDER — KETOROLAC TROMETHAMINE 30 MG/ML IJ SOLN
30.0000 mg | Freq: Four times a day (QID) | INTRAMUSCULAR | Status: AC
  Administered 2021-09-17 – 2021-09-18 (×2): 30 mg via INTRAVENOUS
  Filled 2021-09-17 (×2): qty 1

## 2021-09-17 MED ORDER — MENTHOL 3 MG MT LOZG: 1.0000 | LOZENGE | OROMUCOSAL | Status: DC | PRN

## 2021-09-17 MED ORDER — ZOLPIDEM TARTRATE 5 MG PO TABS: 5.0000 mg | ORAL_TABLET | Freq: Every evening | ORAL | Status: DC | PRN

## 2021-09-17 MED ORDER — ACETAMINOPHEN 160 MG/5ML PO SOLN: 1000.0000 mg | Freq: Once | ORAL | Status: DC | PRN

## 2021-09-17 MED ORDER — DEXAMETHASONE SODIUM PHOSPHATE 4 MG/ML IJ SOLN
INTRAMUSCULAR | Status: DC | PRN
  Administered 2021-09-17: 4 mg via INTRAVENOUS

## 2021-09-17 MED ORDER — OXYTOCIN-SODIUM CHLORIDE 30-0.9 UT/500ML-% IV SOLN
2.5000 [IU]/h | INTRAVENOUS | Status: AC
  Administered 2021-09-17: 2.5 [IU]/h via INTRAVENOUS
  Filled 2021-09-17: qty 500

## 2021-09-17 MED ORDER — DIPHENHYDRAMINE HCL 50 MG/ML IJ SOLN: 12.5000 mg | INTRAMUSCULAR | Status: DC | PRN

## 2021-09-17 MED ORDER — ONDANSETRON HCL 4 MG/2ML IJ SOLN: 4.0000 mg | Freq: Three times a day (TID) | INTRAMUSCULAR | Status: DC | PRN

## 2021-09-17 MED ORDER — DEXAMETHASONE SODIUM PHOSPHATE 4 MG/ML IJ SOLN
INTRAMUSCULAR | Status: AC
  Filled 2021-09-17: qty 1

## 2021-09-17 MED ORDER — SIMETHICONE 80 MG PO CHEW
80.0000 mg | CHEWABLE_TABLET | Freq: Three times a day (TID) | ORAL | Status: DC
  Administered 2021-09-17 – 2021-09-20 (×6): 80 mg via ORAL
  Filled 2021-09-17 (×7): qty 1

## 2021-09-17 MED ORDER — KETOROLAC TROMETHAMINE 30 MG/ML IJ SOLN
INTRAMUSCULAR | Status: AC
  Filled 2021-09-17: qty 1

## 2021-09-17 MED ORDER — PRENATAL MULTIVITAMIN CH
1.0000 | ORAL_TABLET | Freq: Every day | ORAL | Status: DC
  Administered 2021-09-18: 1 via ORAL
  Filled 2021-09-17 (×2): qty 1

## 2021-09-17 MED ORDER — ACETAMINOPHEN 500 MG PO TABS: 1000.0000 mg | ORAL_TABLET | Freq: Once | ORAL | Status: DC | PRN

## 2021-09-17 MED ORDER — NALOXONE HCL 4 MG/10ML IJ SOLN
1.0000 ug/kg/h | INTRAVENOUS | Status: DC | PRN
  Filled 2021-09-17: qty 5

## 2021-09-17 MED ORDER — DIPHENHYDRAMINE HCL 25 MG PO CAPS: 25.0000 mg | ORAL_CAPSULE | Freq: Four times a day (QID) | ORAL | Status: DC | PRN

## 2021-09-17 MED ORDER — SODIUM CHLORIDE 0.9% FLUSH
3.0000 mL | INTRAVENOUS | Status: DC | PRN
Start: 2021-09-17 — End: 2021-09-20

## 2021-09-17 MED ORDER — FENTANYL CITRATE (PF) 100 MCG/2ML IJ SOLN
INTRAMUSCULAR | Status: DC | PRN
Start: 2021-09-17 — End: 2021-09-17
  Administered 2021-09-17: 15 ug via INTRATHECAL

## 2021-09-17 MED ORDER — SENNOSIDES-DOCUSATE SODIUM 8.6-50 MG PO TABS
2.0000 | ORAL_TABLET | Freq: Every day | ORAL | Status: DC
  Administered 2021-09-18 – 2021-09-19 (×2): 2 via ORAL
  Filled 2021-09-17 (×2): qty 2

## 2021-09-17 MED ORDER — TETANUS-DIPHTH-ACELL PERTUSSIS 5-2.5-18.5 LF-MCG/0.5 IM SUSY: 0.5000 mL | PREFILLED_SYRINGE | Freq: Once | INTRAMUSCULAR | Status: DC

## 2021-09-17 MED ORDER — FENTANYL CITRATE (PF) 100 MCG/2ML IJ SOLN
INTRAMUSCULAR | Status: AC
  Filled 2021-09-17: qty 2

## 2021-09-17 MED ORDER — WITCH HAZEL-GLYCERIN EX PADS: 1.0000 "application " | MEDICATED_PAD | CUTANEOUS | Status: DC | PRN

## 2021-09-17 MED ORDER — OXYTOCIN-SODIUM CHLORIDE 30-0.9 UT/500ML-% IV SOLN
INTRAVENOUS | Status: DC | PRN
  Administered 2021-09-17: 300 mL via INTRAVENOUS

## 2021-09-17 MED ORDER — OXYCODONE HCL 5 MG PO TABS
5.0000 mg | ORAL_TABLET | ORAL | Status: DC | PRN
  Administered 2021-09-18: 5 mg via ORAL
  Filled 2021-09-17: qty 1

## 2021-09-17 MED ORDER — DIPHENHYDRAMINE HCL 25 MG PO CAPS: 25.0000 mg | ORAL_CAPSULE | ORAL | Status: DC | PRN

## 2021-09-17 MED ORDER — COCONUT OIL OIL: 1.0000 "application " | TOPICAL_OIL | Status: DC | PRN

## 2021-09-17 MED ORDER — SODIUM CHLORIDE 0.9 % IR SOLN
Status: DC | PRN
Start: 2021-09-17 — End: 2021-09-17
  Administered 2021-09-17: 1000 mL

## 2021-09-17 MED ORDER — KETOROLAC TROMETHAMINE 30 MG/ML IJ SOLN
INTRAMUSCULAR | Status: DC | PRN
  Administered 2021-09-17: 30 mg via INTRAVENOUS

## 2021-09-17 MED ORDER — PHENYLEPHRINE HCL-NACL 20-0.9 MG/250ML-% IV SOLN
INTRAVENOUS | Status: DC | PRN
  Administered 2021-09-17: 60 ug/min via INTRAVENOUS

## 2021-09-17 MED ORDER — FENTANYL CITRATE (PF) 100 MCG/2ML IJ SOLN
25.0000 ug | INTRAMUSCULAR | Status: DC | PRN
  Administered 2021-09-17: 50 ug via INTRAVENOUS

## 2021-09-17 MED ORDER — ACETAMINOPHEN 500 MG PO TABS
1000.0000 mg | ORAL_TABLET | Freq: Four times a day (QID) | ORAL | Status: DC
  Administered 2021-09-17 – 2021-09-20 (×11): 1000 mg via ORAL
  Filled 2021-09-17 (×12): qty 2

## 2021-09-17 MED ORDER — DIBUCAINE (PERIANAL) 1 % EX OINT: 1.0000 "application " | TOPICAL_OINTMENT | CUTANEOUS | Status: DC | PRN

## 2021-09-17 MED ORDER — IBUPROFEN 600 MG PO TABS
600.0000 mg | ORAL_TABLET | Freq: Four times a day (QID) | ORAL | Status: DC
  Administered 2021-09-18 – 2021-09-20 (×8): 600 mg via ORAL
  Filled 2021-09-17 (×8): qty 1

## 2021-09-17 MED ORDER — POVIDONE-IODINE 10 % EX SWAB
2.0000 "application " | Freq: Once | CUTANEOUS | Status: AC
  Administered 2021-09-17: 2 via TOPICAL

## 2021-09-17 MED ORDER — ACETAMINOPHEN 10 MG/ML IV SOLN: 1000.0000 mg | Freq: Once | INTRAVENOUS | Status: DC | PRN

## 2021-09-17 MED ORDER — SCOPOLAMINE 1 MG/3DAYS TD PT72
MEDICATED_PATCH | TRANSDERMAL | Status: AC
  Filled 2021-09-17: qty 1

## 2021-09-17 MED ORDER — CEFAZOLIN SODIUM-DEXTROSE 2-4 GM/100ML-% IV SOLN
2.0000 g | INTRAVENOUS | Status: AC
  Administered 2021-09-17: 2 g via INTRAVENOUS

## 2021-09-17 MED ORDER — ONDANSETRON HCL 4 MG/2ML IJ SOLN
INTRAMUSCULAR | Status: DC | PRN
  Administered 2021-09-17: 4 mg via INTRAVENOUS

## 2021-09-17 MED ORDER — SIMETHICONE 80 MG PO CHEW: 80.0000 mg | CHEWABLE_TABLET | ORAL | Status: DC | PRN

## 2021-09-17 MED ORDER — MORPHINE SULFATE (PF) 0.5 MG/ML IJ SOLN
INTRAMUSCULAR | Status: AC
  Filled 2021-09-17: qty 10

## 2021-09-17 MED ORDER — ACETAMINOPHEN 10 MG/ML IV SOLN
INTRAVENOUS | Status: DC | PRN
  Administered 2021-09-17: 1000 mg via INTRAVENOUS

## 2021-09-17 SURGICAL SUPPLY — 39 items
CHLORAPREP W/TINT 26ML (MISCELLANEOUS) ×2 IMPLANT
CLAMP CORD UMBIL (MISCELLANEOUS) IMPLANT
CLOTH BEACON ORANGE TIMEOUT ST (SAFETY) ×2 IMPLANT
DERMABOND ADVANCED (GAUZE/BANDAGES/DRESSINGS) ×1
DERMABOND ADVANCED .7 DNX12 (GAUZE/BANDAGES/DRESSINGS) IMPLANT
DRSG OPSITE POSTOP 4X10 (GAUZE/BANDAGES/DRESSINGS) ×2 IMPLANT
ELECT REM PT RETURN 9FT ADLT (ELECTROSURGICAL) ×2
ELECTRODE REM PT RTRN 9FT ADLT (ELECTROSURGICAL) ×1 IMPLANT
EXTRACTOR VACUUM BELL STYLE (SUCTIONS) IMPLANT
GAUZE SPONGE 4X4 12PLY STRL LF (GAUZE/BANDAGES/DRESSINGS) ×2 IMPLANT
GLOVE BIOGEL PI IND STRL 7.0 (GLOVE) ×2 IMPLANT
GLOVE BIOGEL PI INDICATOR 7.0 (GLOVE) ×2
GLOVE ECLIPSE 7.0 STRL STRAW (GLOVE) ×2 IMPLANT
GOWN STRL REUS W/TWL LRG LVL3 (GOWN DISPOSABLE) ×4 IMPLANT
KIT ABG SYR 3ML LUER SLIP (SYRINGE) ×2 IMPLANT
NDL HYPO 25X5/8 SAFETYGLIDE (NEEDLE) ×1 IMPLANT
NEEDLE HYPO 25X5/8 SAFETYGLIDE (NEEDLE) ×2 IMPLANT
NS IRRIG 1000ML POUR BTL (IV SOLUTION) ×2 IMPLANT
PACK C SECTION WH (CUSTOM PROCEDURE TRAY) ×2 IMPLANT
PAD ABD 7.5X8 STRL (GAUZE/BANDAGES/DRESSINGS) ×1 IMPLANT
PAD OB MATERNITY 4.3X12.25 (PERSONAL CARE ITEMS) ×2 IMPLANT
PENCIL SMOKE EVAC W/HOLSTER (ELECTROSURGICAL) ×2 IMPLANT
PENCIL SMOKE EVACUATOR (MISCELLANEOUS) ×1 IMPLANT
RTRCTR C-SECT PINK 25CM LRG (MISCELLANEOUS) ×2 IMPLANT
SUT MNCRL 0 VIOLET CTX 36 (SUTURE) ×2 IMPLANT
SUT MONOCRYL 0 CTX 36 (SUTURE) ×2
SUT PLAIN 0 NONE (SUTURE) IMPLANT
SUT PLAIN 2 0 (SUTURE)
SUT PLAIN 2 0 XLH (SUTURE) IMPLANT
SUT PLAIN ABS 2-0 CT1 27XMFL (SUTURE) IMPLANT
SUT PROLENE 1 CT (SUTURE) ×1 IMPLANT
SUT VIC AB 0 CTX 36 (SUTURE) ×2
SUT VIC AB 0 CTX36XBRD ANBCTRL (SUTURE) ×1 IMPLANT
SUT VIC AB 2-0 CT1 27 (SUTURE) ×1
SUT VIC AB 2-0 CT1 TAPERPNT 27 (SUTURE) ×1 IMPLANT
SUT VIC AB 4-0 KS 27 (SUTURE) ×2 IMPLANT
TOWEL OR 17X24 6PK STRL BLUE (TOWEL DISPOSABLE) ×2 IMPLANT
TRAY FOLEY W/BAG SLVR 14FR LF (SET/KITS/TRAYS/PACK) IMPLANT
WATER STERILE IRR 1000ML POUR (IV SOLUTION) ×2 IMPLANT

## 2021-09-17 NOTE — Anesthesia Procedure Notes (Signed)
Spinal ? ?Patient location during procedure: OR ?Start time: 09/17/2021 12:39 PM ?End time: 09/17/2021 12:44 PM ?Reason for block: surgical anesthesia ?Staffing ?Performed: anesthesiologist  ?Anesthesiologist: Val Eagle, MD ?Preanesthetic Checklist ?Completed: patient identified, IV checked, risks and benefits discussed, surgical consent, monitors and equipment checked, pre-op evaluation and timeout performed ?Spinal Block ?Patient position: sitting ?Prep: DuraPrep ?Patient monitoring: heart rate, cardiac monitor, continuous pulse ox and blood pressure ?Approach: midline ?Location: L3-4 ?Injection technique: single-shot ?Needle ?Needle type: Pencan  ?Needle gauge: 24 G ?Needle length: 9 cm ?Assessment ?Sensory level: T6 ?Events: CSF return ? ? ? ?

## 2021-09-17 NOTE — Discharge Summary (Signed)
? ?  Postpartum Discharge Summary ? ?   ?Patient Name: Susan Chandler ?DOB: [birth year] ?MRN: [id number] ? ?Date of admission: 09/17/2021 ?Delivery date:09/17/2021  ?Delivering provider: Clarnce Flock  ?Date of discharge: 09/20/2021 ? ?Admitting diagnosis: Gestational hypertension [O13.9] ?Intrauterine pregnancy: [redacted]w[redacted]d    ?Secondary diagnosis:  Principal Problem: ?  Gestational hypertension ?Active Problems: ?  Generalized anxiety disorder ?  Moderate single current episode of major depressive disorder ([place] ?  Bipolar disorder ([place] ?  Gestational diabetes ?  Delivery, breech ?  Cesarean delivery delivered ?   Status post cesarean delivery ? ?Additional problems: none     ?Discharge diagnosis: Term Pregnancy Delivered, Gestational Hypertension, and GDM A1                                              ?Post partum procedures: none  ?Augmentation:  none ?Complications: None ? ?Hospital course: Sceduled C/S   31y.o. yo G1P1001 at 324w3das admitted to the hospital 09/17/2021 for scheduled cesarean section with the following indication:Malpresentation.Delivery details are as follows:  ?Membrane Rupture Time/Date: 1:08 PM ,09/17/2021   ?Delivery Method:C-Section, Low Transverse  ?Details of operation can be found in separate operative note.  Patient had an uncomplicated postpartum course.  Her BPs have been within normal range throughout the postpartum period.  She did take Lasix daily x 2 days, but since she is normotensive throughout , will not send home on Lasix  She is ambulating, tolerating a regular diet, passing flatus, and urinating well. Patient is discharged home in stable condition on  09/20/21 ?       ?Newborn Data: ?Birth date:09/17/2021  ?Birth time:1:09 PM  ?Gender:Female  ?Living status:Living  ?Apgars:8 ,9  ?Weight:3210 g    ? ?Magnesium Sulfate received: No ?BMZ received: No ?Rhophylac:No ?MMR:Yes ?T-DaP:Given prenatally ?Flu: No ?Transfusion:No ? ?Physical exam  ?Vitals:  ? 09/19/21 0500 09/19/21 1430  09/19/21 2025 09/20/21 0532  ?BP: 123/73 133/83 126/72 127/82  ?Pulse: 81 77 89 76  ?Resp: '18 16 18 16  ' ?Temp: 98.9 ?F (37.2 ?C) 98 ?F (36.7 ?C) 98.8 ?F (37.1 ?C) 98.7 ?F (37.1 ?C)  ?TempSrc: Oral Oral Oral Oral  ?SpO2:  99% 100%   ?Weight:      ?Height:      ? ?General: alert, cooperative, and no distress ?Lochia: appropriate ?Uterine Fundus: firm ?Incision: Healing well with no significant drainage, Dressing is clean, dry, and intact ?DVT Evaluation: No evidence of DVT seen on physical exam. ?Labs: ?Lab Results  ?Component Value Date  ? WBC 15.0 (H) 09/18/2021  ? HGB 9.7 (L) 09/18/2021  ? HCT 27.8 (L) 09/18/2021  ? MCV 93.3 09/18/2021  ? PLT 242 09/18/2021  ? ? ?  Latest Ref Rng & Units 09/12/2021  ?  8:20 PM  ?CMP  ?Glucose 70 - 99 mg/dL 97    ?BUN 6 - 20 mg/dL 9    ?Creatinine 0.44 - 1.00 mg/dL 0.66    ?Sodium 135 - 145 mmol/L 139    ?Potassium 3.5 - 5.1 mmol/L 4.1    ?Chloride 98 - 111 mmol/L 112    ?CO2 22 - 32 mmol/L 19    ?Calcium 8.9 - 10.3 mg/dL 8.5    ?Total Protein 6.5 - 8.1 g/dL 5.7    ?Total Bilirubin 0.3 - 1.2 mg/dL 0.3    ?Alkaline Phos 38 -  126 U/L 154    ?AST 15 - 41 U/L 20    ?ALT 0 - 44 U/L 14    ? ?Edinburgh Score: ? ?  09/17/2021  ?  4:45 PM  ?Edinburgh Postnatal Depression Scale Screening Tool  ?I have been able to laugh and see the funny side of things. 0  ?I have looked forward with enjoyment to things. 0  ?I have blamed myself unnecessarily when things went wrong. 1  ?I have been anxious or worried for no good reason. 1  ?I have felt scared or panicky for no good reason. 1  ?Things have been getting on top of me. 1  ?I have been so unhappy that I have had difficulty sleeping. 0  ?I have felt sad or miserable. 1  ?I have been so unhappy that I have been crying. 0  ?The thought of harming myself has occurred to me. 0  ?Edinburgh Postnatal Depression Scale Total 5  ? ? ? ?After visit meds:  ?Allergies as of 09/20/2021   ? ?   Reactions  ? Shellfish Allergy Nausea And Vomiting  ? ?  ? ?   ?Medication List  ?  ? ?STOP taking these medications   ? ?acetaminophen 500 MG tablet ?Commonly known as: TYLENOL ?  ?fluticasone 50 MCG/ACT nasal spray ?Commonly known as: FLONASE ?  ?multivitamin-prenatal 27-0.8 MG Tabs tablet ?  ?OneTouch Delica Plus VAPOLI10V Misc ?  ?OneTouch Verio Reflect w/Device Kit ?  ?OneTouch Verio test strip ?Generic drug: glucose blood ?  ? ?  ? ?TAKE these medications   ? ?ibuprofen 600 MG tablet ?Commonly known as: ADVIL ?Take 1 tablet (600 mg total) by mouth every 6 (six) hours. ?  ?oxyCODONE 5 MG immediate release tablet ?Commonly known as: Oxy IR/ROXICODONE ?Take 1 tablet (5 mg total) by mouth every 6 (six) hours as needed for moderate pain. ?  ? ?  ? ? ? ?Discharge home in stable condition ?Infant Feeding: Breast ?Infant Disposition:home with mother ?Discharge instruction: per After Visit Summary and Postpartum booklet. ?Activity: Advance as tolerated. Pelvic rest for 6 weeks.  ?Diet: routine diet ?Future Appointments: ?Future Appointments  ?Date Time Provider Ashley  ?09/24/2021 11:10 AM Florian Buff, MD [facility]  ?10/29/2021 10:10 AM Roma Schanz, CNM [facility]  ? ?Follow up Visit: ? Follow-up Information   ? ? [facility]. Schedule an appointment as soon as possible for a visit in 1 week(s).   ?Contact information: ?[address] [place] [address] ?[phone] ? ?  ?  ? ?  ?  ? ?  ? ? ?Message sent to [facility] by Dr Higinio Plan:  ?Please schedule this patient for a In person postpartum visit in 6 weeks with the following provider: Any provider. ?Additional Postpartum F/U:Postpartum Depression checkup, Incision check 1 week, and BP check 1 week  ?High risk pregnancy complicated by: GDM and HTN ?Delivery mode:  C-Section, Low Transverse  ?Anticipated Birth Control:  Condoms ? ? ?09/20/2021 ?Hansel Feinstein, CNM ? ? ? ?

## 2021-09-17 NOTE — Anesthesia Preprocedure Evaluation (Addendum)
Anesthesia Evaluation  ?Patient identified by MRN, date of birth, ID band ?Patient awake ? ? ? ?Reviewed: ?Allergy & Precautions, NPO status , Patient's Chart, lab work & pertinent test results ? ?History of Anesthesia Complications ?Negative for: history of anesthetic complications ? ?Airway ?Mallampati: III ? ?TM Distance: >3 FB ?Neck ROM: Full ? ? ? Dental ? ?(+) Dental Advisory Given, Teeth Intact,  ?  ?Pulmonary ?neg pulmonary ROS,  ?  ?breath sounds clear to auscultation ? ? ? ? ? ? Cardiovascular ?hypertension,  ?Rhythm:Regular  ?late pregnancy elevated BP no on meds yet but led to decision to proceed with section ?  ?Neuro/Psych ? Headaches, Degenerative disc with intermittent left leg numbness per patient  ? Neuromuscular disease   ? GI/Hepatic ?  ?Endo/Other  ?diabetes, Gestational ? Renal/GU ?  ? ?  ?Musculoskeletal ?negative musculoskeletal ROS ?(+)  ? Abdominal ?  ?Peds ? Hematology ?negative hematology ROS ?(+) Lab Results ?     Component                Value               Date                 ?     WBC                      10.3                09/14/2021           ?     HGB                      12.7                09/14/2021           ?     HCT                      36.4                09/14/2021           ?     MCV                      92.6                09/14/2021           ?     PLT                      266                 09/14/2021           ?   ?Anesthesia Other Findings ? ? Reproductive/Obstetrics ?(+) Pregnancy ? ?  ? ? ? ? ? ? ? ? ? ? ? ? ? ?  ?  ? ? ? ? ? ? ? ?Anesthesia Physical ?Anesthesia Plan ? ?ASA: 2 ? ?Anesthesia Plan: Spinal  ? ?Post-op Pain Management: Toradol IV (intra-op)*  ? ?Induction:  ? ?PONV Risk Score and Plan: 2 and Ondansetron, Dexamethasone and Scopolamine patch - Pre-op ? ?Airway Management Planned: Natural Airway ? ?Additional Equipment: None ? ?Intra-op Plan:  ? ?Post-operative Plan:  ? ?Informed Consent: I have reviewed the patients  History and Physical, chart, labs and discussed the procedure including the  risks, benefits and alternatives for the proposed anesthesia with the patient or authorized representative who has indicated his/her understanding and acceptance.  ? ? ? ?Dental advisory given ? ?Plan Discussed with: CRNA and Anesthesiologist ? ?Anesthesia Plan Comments:   ? ? ? ? ? ?Anesthesia Quick Evaluation ? ?

## 2021-09-17 NOTE — Op Note (Signed)
[name] ?PROCEDURE DATE: 09/17/2021 ? ?PREOPERATIVE DIAGNOSIS: Intrauterine pregnancy at  [redacted]w[redacted]d weeks gestation; malpresentation: breech , gestational hypertension ? ?POSTOPERATIVE DIAGNOSIS: The same ? ?PROCEDURE: Primary Low Transverse Cesarean Section ? ?SURGEON:  Dr. Merian Capron  ? ?ASSISTANT: Dr. Leticia Penna ? ?INDICATIONS: Susan Chandler is a 31 y.o. G1P1001 at [redacted]w[redacted]d scheduled for cesarean section secondary to malpresentation: Breech and gestational hypertension.  The risks of cesarean section discussed with the patient included but were not limited to: bleeding which may require transfusion or reoperation; infection which may require antibiotics; injury to bowel, bladder, ureters or other surrounding organs; injury to the fetus; need for additional procedures including hysterectomy in the event of a life-threatening hemorrhage; placental abnormalities wth subsequent pregnancies, incisional problems, thromboembolic phenomenon and other postoperative/anesthesia complications. The patient concurred with the proposed plan, giving informed written consent for the procedure.   ? ?FINDINGS:  Viable female infant in frank breech presentation.  Apgars 8 and 9, weight 3210 grams.  Clear amniotic fluid.  Intact placenta, three vessel cord.  Normal uterus, fallopian tubes and ovaries bilaterally. ? ?ANESTHESIA:    Spinal ?INTRAVENOUS FLUIDS: 1,000 ml ?ESTIMATED BLOOD LOSS: 801 ml ?URINE OUTPUT:  100 ml ?SPECIMENS: Placenta sent to L&D ?COMPLICATIONS: None immediate ? ?PROCEDURE IN DETAIL:  The patient received intravenous antibiotics and had sequential compression devices applied to her lower extremities while in the preoperative area.  She was then taken to the operating room where spinal anesthesia was administered and was found to be adequate. She was then placed in a dorsal supine position with a leftward tilt, and prepped and draped in a sterile manner.  A foley catheter was placed into her bladder and  attached to constant gravity, which drained clear fluid throughout.  After an adequate timeout was performed, a Pfannenstiel skin incision was made with scalpel and carried through to the underlying layer of fascia. The fascia was incised in the midline and this incision was extended bilaterally bluntly. The rectus muscles were separated in the midline bluntly and the peritoneum was entered bluntly. An Alexis retractor was placed to aid in visualization of the uterus.  Attention was turned to the lower uterine segment where a transverse hysterotomy was made with a scalpel and extended bilaterally bluntly.  ? ?The infant was successfully delivered, and cord was clamped and cut and infant was handed over to awaiting neonatology team. Uterine massage was then administered and the placenta delivered intact with three-vessel cord. The uterus was then cleared of clot and debris.  The hysterotomy was closed with 0 Monocryl in a running locked fashion, and an imbricating layer was also placed with a 0 Monocryl. Overall, excellent hemostasis was noted. The abdomen and the pelvis were cleared of all clot and debris and the Jon Gills was removed. Hemostasis was confirmed on all surfaces. The fascia was then closed using 0 Vicryl in a running fashion. The subcutaneous layer was reapproximated with plain gut and the skin was closed with 4-0 vicryl. The patient tolerated the procedure well. Sponge, lap, instrument and needle counts were correct x 2. She was taken to the recovery room in stable condition.  ? ? ?Allayne Stack, DO ?09/17/2021 2:30 PM  ? ? ?Attestation of Attending Supervision of Obstetric Fellow During Surgery: An experienced assistant was required given the standard of surgical care given the complexity of the case.  This assistant was needed for exposure, dissection, suctioning, retraction, instrument exchange, assisting with delivery with administration of fundal pressure, and for overall  help during the  procedure. Surgery was performed with the Obstetric Fellow under my supervision and collaboration.  I was present and scrubbed for the entire procedure.   I have reviewed the Obstetric Fellow's operative report, and I agree with the documentation. I have also made any necessary editorial changes. ? ?Venora Maples, MD/MPH ?Attending Family Medicine Physician, Faculty Practice ?[facility] for [employer], [facility] ? ?

## 2021-09-17 NOTE — Anesthesia Postprocedure Evaluation (Signed)
Anesthesia Post Note ? ?Patient: Susan Chandler ? ?Procedure(s) Performed: CESAREAN SECTION (Abdomen) ? ?  ? ?Patient location during evaluation: PACU ?Anesthesia Type: Spinal ?Level of consciousness: awake and alert ?Pain management: pain level controlled ?Vital Signs Assessment: post-procedure vital signs reviewed and stable ?Respiratory status: spontaneous breathing, nonlabored ventilation and respiratory function stable ?Cardiovascular status: blood pressure returned to baseline and stable ?Postop Assessment: no apparent nausea or vomiting ?Anesthetic complications: no ? ? ?No notable events documented. ? ?Last Vitals:  ?Vitals:  ? 09/17/21 1430 09/17/21 1445  ?BP: 135/78 117/69  ?Pulse: 73 68  ?Resp: (!) 21 (!) 22  ?Temp: 37.2 ?C   ?SpO2: 98% 98%  ?  ?Last Pain:  ?Vitals:  ? 09/17/21 1451  ?TempSrc:   ?PainSc: 4   ? ?Pain Goal:   ? ?LLE Motor Response: Non-purposeful movement (09/17/21 1445) ?LLE Sensation: Tingling (09/17/21 1445) ?RLE Motor Response: Non-purposeful movement (09/17/21 1445) ?RLE Sensation: Tingling (09/17/21 1445) ?L Sensory Level: L2-Upper inner thigh, upper buttock (09/17/21 1445) ?R Sensory Level: L2-Upper inner thigh, upper buttock (09/17/21 1445) ?Epidural/Spinal Function Cutaneous sensation: Tingles (09/17/21 1445), Patient able to flex knees: Yes (09/17/21 1445), Patient able to lift hips off bed: No (09/17/21 1445), Back pain beyond tenderness at insertion site: No (09/17/21 1445), Progressively worsening motor and/or sensory loss: No (09/17/21 1445), Bowel and/or bladder incontinence post epidural: No (09/17/21 1445) ? ?Susan Chandler ? ? ? ? ?

## 2021-09-17 NOTE — H&P (Signed)
Obstetric Preoperative History and Physical ? ?Susan Chandler is a 31 y.o. G1P0000 with IUP at 58w3dpresenting for scheduled cesarean section due to breech presentation with known gestational hypertension.  No acute concerns.  ? ?Prenatal Course ?[facility]  ?Pregnancy complications or risks: ?-gHTN, asymptomatic  ?-Complete breech  ?-A1GDM well controlled   ? ?Patient Active Problem List  ? Diagnosis Date Noted  ? Gestational hypertension 09/14/2021  ? Gestational diabetes 07/11/2021  ? Asymptomatic bacteriuria during pregnancy 04/16/2021  ? Supervision of high risk pregnancy, antepartum 04/05/2021  ? Abdominal pain, epigastric 10/21/2018  ? RUQ pain 10/21/2018  ? Bipolar disorder ([place]   ? Moderate single current episode of major depressive disorder ([place] 04/05/2016  ? Social anxiety disorder 01/08/2016  ? Maxillary sinusitis, acute 09/12/2015  ? Generalized anxiety disorder 02/04/2014  ? Insomnia 02/04/2014  ? Left lumbar radiculopathy 10/18/2013  ? ?She plans to breastfeed ?She desires condoms for postpartum contraception.  ? ?Prenatal labs and studies: ?ABO, Rh: --/--/A POS (03/24 1132) ?Antibody: NEG (03/24 1132) ?Rubella: 2.22 (10/04 1634) ?RPR: NON REACTIVE (03/24 1134)  ?HBsAg: Negative (10/04 1634)  ?HIV: Non Reactive (01/17 06270  ?GJJK:KXFGHWEX/- (03/21 1615) ?2 hr Glucola  failed ?Genetic screening normal ?Anatomy UKoreanormal ? ?Prenatal Transfer Tool  ?Maternal Diabetes: Yes:  Diabetes Type:  Diet controlled ?Genetic Screening: Normal ?Maternal Ultrasounds/Referrals: Normal ?Fetal Ultrasounds or other Referrals:  None ?Maternal Substance Abuse:  No ?Significant Maternal Medications:  None ?Significant Maternal Lab Results: Group B Strep positive ? ?Past Medical History:  ?Diagnosis Date  ? Abdominal pain   ? Persistent Mar/Apr 2020: w/u pretty unrevealing by me ([place]); pt to see GI 10/21/18.  ? Abnormal uterine bleeding   ? Dr. EElonda Husky Nuvaring as of 09/2019  ? Bipolar disorder ([place]    ? r/o OCD per psychiatrist 03/2016.  Also, psychologist eval revealed suspected Bipolar II, depressed phase 04/16/2016.  ? Chronic pain syndrome   ? Dysmenorrhea   ? mgmt per Dr. EElonda Husky(01/2018).  Nuva Ring vag inserts 07/2018.  ? Erosive gastropathy 10/2018  ? Nonbleeding (EGD 10/2018)  ? GAD (generalized anxiety disorder)   ? Gestational diabetes   ? Infectious mononucleosis 2007  ? Lumbosacral radiculopathy at L5 2015  ? [facility]; [facility] where she got ESI left L5-S1 11/2013 with moderate improvement.  Saw neurosurgeon 01/2016: surgery NOT recommended.   Severe recurrence 01/2021->rpt MRI w/out neural compression->plan for ESI L5-S1 left.  ? Migraine syndrome   ? Obesity, Class I, BMI 30-34.9   ? Pre-syncope 2016  ? ? POTS.  Holter showed mostly NSR, occ PACs, one run of nonsustained atrial tachy.  ? Pregnancy induced hypertension   ? Salmonella enteritis 09/2017  ? Scoliosis   ? ? ?Past Surgical History:  ?Procedure Laterality Date  ? BIOPSY  10/26/2018  ? Procedure: BIOPSY;  Surgeon: RRogene Houston MD;  [facility];  Service: Endoscopy;;  ? ESOPHAGOGASTRODUODENOSCOPY  10/2018  ? Nonbleeding erosive gastropathy, H pylori NEG.  ? ESOPHAGOGASTRODUODENOSCOPY (EGD) WITH PROPOFOL N/A 10/26/2018  ? Procedure: ESOPHAGOGASTRODUODENOSCOPY (EGD) WITH PROPOFOL;  Surgeon: RRogene Houston MD;  [facility];  Service: Endoscopy;  Laterality: N/A;  ? WISDOM TOOTH EXTRACTION    ? ? ?OB History  ?Gravida Para Term Preterm AB Living  ?1 0 0 0 0 0  ?SAB IAB Ectopic Multiple Live Births  ?0 0 0 0 0  ?  ?#  Outcome Date GA Lbr Len/2nd Weight Sex Delivery Anes PTL Lv  ?1 Current           ? ? ?Social History  ? ?Socioeconomic History  ? Marital status: Married  ?  Spouse name: Not on file  ? Number of children: Not on file  ? Years of education: Not on file  ? Highest education level: Not on file  ?Occupational History  ? Not on file  ?Tobacco  Use  ? Smoking status: Never  ? Smokeless tobacco: Never  ?Vaping Use  ? Vaping Use: Never used  ?Substance and Sexual Activity  ? Alcohol use: No  ?  Comment: 04-05-2016 per pt no  ? Drug use: No  ?  Comment: 04-05-2016 per pt no   ? Sexual activity: Yes  ?  Birth control/protection: None  ?Other Topics Concern  ? Not on file  ?Social History Narrative  ? Married, no children.  ? College: [employer].  ? Occupation: Used to work at [employer] in [employer].  ? Moved to [place], [place] in 2015.  ? No T/A/Ds.  ? No exercise.  ? ?Social Determinants of Health  ? ?Financial Resource Strain: Low Risk   ? Difficulty of Paying Living Expenses: Not very hard  ?Food Insecurity: No Food Insecurity  ? Worried About Charity fundraiser in the Last Year: Never true  ? Ran Out of Food in the Last Year: Never true  ?Transportation Needs: No Transportation Needs  ? Lack of Transportation (Medical): No  ? Lack of Transportation (Non-Medical): No  ?Physical Activity: Inactive  ? Days of Exercise per Week: 0 days  ? Minutes of Exercise per Session: 0 min  ?Stress: No Stress Concern Present  ? Feeling of Stress : Only a little  ?Social Connections: Socially Isolated  ? Frequency of Communication with Friends and Family: Once a week  ? Frequency of Social Gatherings with Friends and Family: Once a week  ? Attends Religious Services: Never  ? Active Member of Clubs or Organizations: No  ? Attends Archivist Meetings: Never  ? Marital Status: Married  ? ? ?Family History  ?Problem Relation Age of Onset  ? Bipolar disorder Father   ? Bipolar disorder Sister   ? Other Sister   ?     e coli  ? Cancer Maternal Aunt   ? Alcohol abuse Maternal Grandfather   ? ? ?Medications Prior to Admission  ?Medication Sig Dispense Refill Last Dose  ? fluticasone (FLONASE) 50 MCG/ACT nasal spray Place 2 sprays into both nostrils daily.   09/16/2021  ? Prenatal Vit-Fe Fumarate-FA (MULTIVITAMIN-PRENATAL) 27-0.8 MG TABS tablet Take 1 tablet by mouth daily.    09/16/2021  ? acetaminophen (TYLENOL) 500 MG tablet Take 2 tablets (1,000 mg total) by mouth every 8 (eight) hours as needed for moderate pain. (Patient not taking: Reported on 09/11/2021) 30 tablet 0 Not Taking  ? Blood Glucose Monitoring Suppl (ONETOUCH VERIO REFLECT) w/Device KIT      ? Lancets (ONETOUCH DELICA PLUS [id number]) MISC Apply topically.     ? ONETOUCH VERIO test strip 4 (four) times daily.     ? ? ?Allergies  ?Allergen Reactions  ? Shellfish Allergy Nausea And Vomiting  ? ? ?Review of Systems: Negative except for what is mentioned in HPI. ? ?Physical Exam: ?BP (!) 149/89   Pulse 93   Temp 98.6 ?F (37 ?C) (Oral)   Resp 16   Ht '5\' 6"'  (1.676 m)   Wt 104.8  kg   LMP 12/20/2020 (Exact Date)   SpO2 100%   BMI 37.28 kg/m?  ?FHR by Doppler: 162 bpm ?CONSTITUTIONAL: Well-developed, well-nourished female in no acute distress.  ?HENT:  Normocephalic, atraumatic. Oropharynx is clear and moist ?EYES: Conjunctivae and EOM are normal. No scleral icterus.  ?NECK: Normal range of motion, supple ?SKIN: Skin is warm and dry. No rash noted. Not diaphoretic. No erythema. ?[place]: Alert and oriented to person, place, and time.  ?PSYCHIATRIC: Normal mood and affect. Normal behavior. ?CARDIOVASCULAR: Normal heart rate noted ?RESPIRATORY: Effort normal, no problems with respiration noted ?ABDOMEN: Soft, nontender, nondistended, gravid.  ?PELVIC: Deferred ?MUSCULOSKELETAL: Normal range of motion.  ? ? ?Pertinent Labs/Studies:   ?Results for orders placed or performed during the hospital encounter of 09/17/21 (from the past 72 hour(s))  ?Glucose, capillary     Status: None  ? Collection Time: 09/17/21 10:45 AM  ?Result Value Ref Range  ? Glucose-Capillary 79 70 - 99 mg/dL  ?  Comment: Glucose reference range applies only to samples taken after fasting for at least 8 hours.  ? ? ?Assessment and Plan :Susan Chandler is a 31 y.o. G1P0000 at 64w3dbeing admitted for scheduled cesarean section due to breech presentation.  The risks of cesarean section discussed with the patient included but were not limited to: bleeding which may require transfusion or reoperation; infection which may require antibiotics; injury to bowel, bladder, ure

## 2021-09-17 NOTE — Lactation Note (Signed)
This note was copied from a baby's chart. ?Lactation Consultation Note ?Baby had had 2 low blood sugars. Baby wouldn't take formula w/first try from RN for this feeding. ?LC used white slow flow held up right and baby took 20 ml formula. Gave 1 ml colostrum via spoon. Baby is sleepy/ it took a while for baby to take formula. ?W/yellow nipple to fast noted it sounded like formula may tried to go in his nose. That's when LC switched nipples. ? Baby done much better then and took formula much better. ?Discussed pumping and mom agreed. Mom shown how to use DEBP & how to disassemble, clean, & reassemble parts. Mom knows to pump q3h for 15-20 min. Mom encouraged to feed baby 8-12 times/24 hours and with feeding cues.   ?Newborn behavior, STS, I&O, positioning, support discussed. ?Shells given to wear in am. ?Reverse pressure some helpful to areola edema.  ?Encouraged mom to call for assistance w/latching. ?LC feels mom will probably need NS. ? ?Patient Name: Susan Chandler ?Today's Date: 09/17/2021 ?Reason for consult: Initial assessment;Early term 37-38.6wks;Primapara;Maternal endocrine disorder ?Age:43 hours ? ?Maternal Data ?Has patient been taught Hand Expression?: Yes ?Does the patient have breastfeeding experience prior to this delivery?: No ? ?Feeding ?Nipple Type: Nfant Standard Flow (white) ? ?LATCH Score ?  ? ?  ? ?Type of Nipple: Everted at rest and after stimulation (very short shaft/non-compressible) ? ?Comfort (Breast/Nipple): Filling, red/small blisters or bruises, mild/mod discomfort (edema) ? ?  ? ?  ? ? ?Lactation Tools Discussed/Used ?Tools: Pump;Shells ?Breast pump type: Double-Electric Breast Pump ?Pump Education: Setup, frequency, and cleaning;Milk Storage ?Reason for Pumping: very short shaft/edema ?Pumping frequency: q3h ? ?Interventions ?Interventions: Breast feeding basics reviewed;Assisted with latch;Skin to skin;Breast massage;Hand express;Pre-pump if needed;Reverse pressure;Breast  compression;Adjust position;Support pillows;Position options;Expressed milk;Shells;DEBP;Pace feeding;LC Services brochure ? ?Discharge ?  ? ?Consult Status ?Consult Status: Follow-up ?Date: 09/18/21 ?Follow-up type: In-patient ? ? ? ?Charyl Dancer ?09/17/2021, 9:53 PM ? ? ? ?

## 2021-09-17 NOTE — Transfer of Care (Signed)
Immediate Anesthesia Transfer of Care Note ? ?Patient: Susan Chandler ? ?Procedure(s) Performed: CESAREAN SECTION (Abdomen) ? ?Patient Location: PACU ? ?Anesthesia Type:Spinal ? ?Level of Consciousness: awake, alert  and oriented ? ?Airway & Oxygen Therapy: Patient Spontanous Breathing ? ?Post-op Assessment: Report given to RN and Post -op Vital signs reviewed and stable ? ?Post vital signs: Reviewed and stable ? ?Last Vitals:  ?Vitals Value Taken Time  ?BP 147/96 09/17/21 1400  ?Temp 36.8 ?C 09/17/21 1400  ?Pulse 86 09/17/21 1401  ?Resp 23 09/17/21 1401  ?SpO2 100 % 09/17/21 1401  ?Vitals shown include unvalidated device data. ? ?Last Pain:  ?Vitals:  ? 09/17/21 1400  ?TempSrc: Oral  ?   ? ?  ? ?Complications: No notable events documented. ?

## 2021-09-18 ENCOUNTER — Encounter: Payer: 59 | Admitting: Women's Health

## 2021-09-18 ENCOUNTER — Other Ambulatory Visit: Payer: 59 | Admitting: Women's Health

## 2021-09-18 DIAGNOSIS — O321XX0 Maternal care for breech presentation, not applicable or unspecified: Secondary | ICD-10-CM | POA: Diagnosis not present

## 2021-09-18 LAB — CBC
HCT: 27.8 % — ABNORMAL LOW (ref 36.0–46.0)
Hemoglobin: 9.7 g/dL — ABNORMAL LOW (ref 12.0–15.0)
MCH: 32.6 pg (ref 26.0–34.0)
MCHC: 34.9 g/dL (ref 30.0–36.0)
MCV: 93.3 fL (ref 80.0–100.0)
Platelets: 242 10*3/uL (ref 150–400)
RBC: 2.98 MIL/uL — ABNORMAL LOW (ref 3.87–5.11)
RDW: 12.7 % (ref 11.5–15.5)
WBC: 15 10*3/uL — ABNORMAL HIGH (ref 4.0–10.5)
nRBC: 0 % (ref 0.0–0.2)

## 2021-09-18 LAB — BIRTH TISSUE RECOVERY COLLECTION (PLACENTA DONATION)

## 2021-09-18 MED ORDER — FERROUS SULFATE 325 (65 FE) MG PO TABS
325.0000 mg | ORAL_TABLET | ORAL | Status: DC
  Administered 2021-09-19: 325 mg via ORAL
  Filled 2021-09-18: qty 1

## 2021-09-18 MED ORDER — FUROSEMIDE 20 MG PO TABS
20.0000 mg | ORAL_TABLET | Freq: Every day | ORAL | Status: DC
Start: 2021-09-19 — End: 2021-09-20
  Administered 2021-09-19: 20 mg via ORAL
  Filled 2021-09-18: qty 1

## 2021-09-18 NOTE — Progress Notes (Signed)
POSTPARTUM PROGRESS NOTE ? ?POD #1 ? ?Subjective: ? ?Susan Chandler is a 31 y.o. G1P1001 s/p pLTCS at [redacted]w[redacted]d. No acute events overnight. She reports she is doing well. She denies any problems with ambulating, voiding or po intake. Denies nausea or vomiting. She has not passed flatus. Pain is well controlled.  Lochia is appropriate. ? ?Objective: ?Blood pressure 128/72, pulse 74, temperature 99 ?F (37.2 ?C), temperature source Oral, resp. rate 16, height 5\' 6"  (1.676 m), weight 104.8 kg, last menstrual period 12/20/2020, SpO2 100 %, unknown if currently breastfeeding. ? ?Physical Exam:  ?General: alert, cooperative and no distress ?Chest: no respiratory distress ?Heart: regular rate, distal pulses intact ?Uterine Fundus: firm, appropriately tender ?DVT Evaluation: No calf swelling or tenderness ?Extremities: no edema; SCDs in place ?Skin: warm, dry; incision clean/dry/intact w/ honeycomb dressing in place ? ?Recent Labs  ?  09/18/21 ?0418  ?HGB 9.7*  ?HCT 27.8*  ? ? ?Assessment/Plan: ?Susan Chandler is a 31 y.o. G1P1001 s/p pLTCS at [redacted]w[redacted]d for malpresentation. ? ?POD#1 - Doing welll; pain well controlled. Hemoglobin 9.7. ? Routine postpartum care ? OOB, ambulated ? Lovenox for VTE prophylaxis ?Anemia: lightheaded when ambulating in the night; asymptomatic now ?Start po ferrous sulfate daily ?History of gHTN. BP today 128/72. ?Contraception: condoms ?Feeding: breastfeeding ? ?Dispo: Plan for discharge 3/39/23. ? ? LOS: 1 day  ? ?09/18/2021 ? ?09/20/2021, PA-S2 ?

## 2021-09-18 NOTE — Clinical Social Work Maternal (Signed)
?CLINICAL SOCIAL WORK MATERNAL/CHILD NOTE ? ?Patient Details  ?Name: Susan Chandler ?MRN: [id number] ?Date of Birth: 01/28/1991 ? ?Date:  09/18/2021 ? ?Clinical Social Worker Initiating Note:  Nicle Garnetta Fedrick, LCSW Date/Time: Initiated:  09/18/21/1425    ? ?Child's Name:  Susan Chandler  ? ?Biological Parents:  Mother, Father (MOB: Susan Chandler 04/25/1990 FOB: Susan Chandler 03-01-1988,)  ? ?Need for Interpreter:  None  ? ?Reason for Referral:  Behavioral Health Concerns  ? ?Address:  [address] ?[place] [address]  ?  ?Phone number:  [phone] (home)    ? ?Additional phone number:  ? ?Household Members/Support Persons (HM/SP):   Household Member/Support Person 1, Household Member/Support Person 2, Household Member/Support Person 3, Household Member/Support Person 4, Household Member/Support Person 5, Household Member/Support Person 6 ? ? ?HM/SP Name Relationship DOB or Age  ?HM/SP -1 Susan Chandler Spouse 03-01-1988  ?HM/SP -2 Susan Chandler Mother 06-14-1964  ?HM/SP -3 Susan Chandler Sister 01-24-1991  ?HM/SP -4 Susan Chandler Sister  03-26-2002  ?HM/SP -5 Susan Chandler Sister  03-26-2002  ?HM/SP -6 Susan Chandler Nephew 08-08-2015  ?HM/SP -7        ?HM/SP -8        ? ? ?Natural Supports (not living in the home):  Extended Family, Immediate Family  ? ?Professional Supports: None  ? ?Employment: Unemployed  ? ?Type of Work:    ? ?Education:  Other (comment) (Associates Degree)  ? ?Homebound arranged:   ? ?Financial Resources:  Private Insurance   ? ?Other Resources:     ? ?Cultural/Religious Considerations Which May Impact Care:   ? ?Strengths:  Ability to meet basic needs  , Home prepared for child  , Pediatrician chosen  ? ?Psychotropic Medications:        ? ?Pediatrician:    [place] ? ?Pediatrician List:  ? ?[place]    ?[place]    ?[place]    ?[place] Other ([facility] [place])  ?[place]    ?[place]    ? ? ?Pediatrician Fax Number:   ? ?Risk  Factors/Current Problems:  None  ? ?Cognitive State:  Able to Concentrate  , Insightful  , Linear Thinking    ? ?Mood/Affect:     ? ?CSW Assessment: CSW received consult for Bipolar, Anxiety and Depression. CSW met with MOB to offer support and complete assessment.   ? ?CSW met with MOB at bedside and introduced CSW role. CSW observed MOB in bed holding the infant and a child was present at bedside. MOB introduced the child as her nephew. CSW offered to return to allow MOB privacy. MOB gave CSW permission to complete the assessment with the child present. CSW inquired how MOB has felt since giving birth. MOB reported feeling "happy and so amazed at the birth." CSW inquired how MOB felt emotionally during the pregnancy. MOB reported that she felt happy and excited about the infant. CSW inquired about MOB mental health history. MOB acknowledged that she was diagnosed with Bipolar II (depressive) and anxiety in 2018. MOB reported, " I don't feel like that anymore, I feel much better." MOB reported that "exercise and distracting my brain has helped." MOB reported she has found what works for her. MOB reported she is not interested in therapy or medications. CSW encouraged MOB to continue using her coping strategies and praised her efforts. CSW discussed PPD. CSW provided education regarding the baby blues period vs. perinatal mood disorders, discussed treatment and gave resources for mental health follow up   if concerns arise.  CSW recommended MOB complete a self-evaluation during the postpartum time period using the New Mom Checklist from Postpartum Progress and encouraged MOB to contact a medical professional if symptoms are noted at any time. CSW assessed MOB for safety. MOB denied thoughts of harm to self and others. MOB denied domestic violence concerns. MOB identified her husband, mom, sisters, and nephew as supports.  ?  ?CSW provided review of Sudden Infant Death Syndrome (SIDS) precautions. MOB reported she has  essential items for the infant including a bassinet where the infant will sleep. MOB has chosen [facility] [place]. CSW assessed MOB for additional needs. MOB reported no further need.  ? ?CSW identifies no further need for intervention and no barriers to discharge at this time. ? ?CSW Plan/Description:  Sudden Infant Death Syndrome (SIDS) Education, Perinatal Mood and Anxiety Disorder (PMADs) Education, No Further Intervention Required/No Barriers to Discharge  ? ? ?Clydean Posas A Millee Denise, LCSW ?09/18/2021, 5:03 PM ?

## 2021-09-18 NOTE — Lactation Note (Signed)
This note was copied from a baby's chart. ?Lactation Consultation Note ?Mom stated the baby isn't able to latch. ?Mom has edema to breast and nipples short shafted, not real compressible. ?[place] attempted latch to breast baby frustrated looking for nipples wouldn't latch. ?[place] fitted mom w/#20 NS. Baby suckle once in a while. LC inserted formula w/curve tip syring and baby suckled then. ?Mom has been using DEBP and giving colostrum then formula. Encouraged to continue to pump every 3 hrs and supplement. Gave parents supplemental sheets for BF and not BF. ?Mom wore her shells today. Praised mom. ?Mom will need more assistance until the baby is BF better. ? ?Patient Name: Susan Chandler ?Today's Date: 09/18/2021 ?Reason for consult: Follow-up assessment;Primapara;Early term 37-38.6wks;Maternal endocrine disorder ?Age:32 hours ? ?Maternal Data ?  ? ?Feeding ?Mother's Current Feeding Choice: Breast Milk and Formula ? ?LATCH Score ?Latch: Repeated attempts needed to sustain latch, nipple held in mouth throughout feeding, stimulation needed to elicit sucking reflex. ? ?Audible Swallowing: None ? ?Type of Nipple: Everted at rest and after stimulation (very short shaft/) ? ?Comfort (Breast/Nipple): Filling, red/small blisters or bruises, mild/mod discomfort (edema) ? ?Hold (Positioning): Full assist, staff holds infant at breast ? ?LATCH Score: 4 ? ? ?Lactation Tools Discussed/Used ?Tools: Shells;Pump;Nipple [place] [employer] ?Nipple shield size: 20 ?Breast pump type: Double-Electric Breast Pump ?Pump Education: Setup, frequency, and cleaning;Milk Storage ?Reason for Pumping: short shaft ?Pumping frequency: q3h ? ?Interventions ?  ? ?Discharge ?  ? ?Consult Status ?Consult Status: Follow-up ?Date: 09/19/21 ?Follow-up type: In-patient ? ? ? ?Theodoro Kalata ?09/18/2021, 9:36 PM ? ? ? ?

## 2021-09-19 NOTE — Progress Notes (Signed)
Subjective: ?Postpartum Day 2: Cesarean Delivery ?Patient reports incisional pain, tolerating PO, + flatus, and no problems voiding.   ? ?Objective: ?Vital signs in last 24 hours: ?Temp:  [98 ?F (36.7 ?C)-98.9 ?F (37.2 ?C)] 98.9 ?F (37.2 ?C) (03/29 0500) ?Pulse Rate:  [74-89] 81 (03/29 0500) ?Resp:  [16-18] 18 (03/29 0500) ?BP: (120-130)/(73-85) 123/73 (03/29 0500) ? ?Physical Exam:  ?General: cooperative and no distress ?Lochia: appropriate ?Uterine Fundus: firm ?Incision: healing well, no significant drainage ?DVT Evaluation: No evidence of DVT seen on physical exam. ? ?Recent Labs  ?  09/18/21 ?0418  ?HGB 9.7*  ?HCT 27.8*  ? ? ?Assessment/Plan: ?Status post Cesarean section. Doing well postoperatively.  ?Continue current care, out of bed more ?Lactation consult ?Discussed option of discharge today, patient feels unprepared to go home today, states needs to stay until tomorrow due to anxiety and wanting more assistance with breastfeeding. ? ?Wynelle Bourgeois ?09/19/2021, 7:16 AM ? ? ?

## 2021-09-19 NOTE — Lactation Note (Signed)
This note was copied from a baby's chart. ?Lactation Consultation Note ?FOB taking baby from mom when [facility] came into the door. Mom stated he finished feeding really good. He's doing so good now. ?Mom denies painful latches. Mom is still using NS and supplementing w/formula. ?Mom stated she pumped 2 times today and gave him 5 ml colostrum each time. ?Praised mom. ?Encouraged mom to call for [facility] to see latch since mom was staying an extra night to get Lactation assistance. Mom stated OK. ? ?Patient Name: Susan Chandler ?Today's Date: 09/19/2021 ?Reason for consult: Follow-up assessment;Primapara;Early term 37-38.6wks;Maternal endocrine disorder ?Age:32 hours ? ?Maternal Data ?  ? ?Feeding ?Mother's Current Feeding Choice: Breast Milk and Formula ?Nipple Type: Nfant Standard Flow (white) ? ?LATCH Score ?  ? ?  ? ?  ? ?  ? ?  ? ?  ? ? ?Lactation Tools Discussed/Used ?Tools: Shells;Nipple Dorris Carnes;Pump ?Nipple shield size: 20 ? ?Interventions ?  ? ?Discharge ?  ? ?Consult Status ?Consult Status: Follow-up ?Date: 09/19/21 ?Follow-up type: In-patient ? ? ? ?Charyl Dancer ?09/19/2021, 9:05 PM ? ? ? ?

## 2021-09-20 MED ORDER — OXYCODONE HCL 5 MG PO TABS: 5.0000 mg | ORAL_TABLET | Freq: Four times a day (QID) | ORAL | 0 refills | Status: DC | PRN

## 2021-09-20 MED ORDER — IBUPROFEN 600 MG PO TABS: 600.0000 mg | ORAL_TABLET | Freq: Four times a day (QID) | ORAL | 0 refills | Status: DC

## 2021-09-20 NOTE — Lactation Note (Signed)
This note was copied from a baby's chart. ?Lactation Consultation Note ? ?Patient Name: Susan Chandler ?Today's Date: 09/20/2021 ?Reason for consult: Follow-up assessment;1st time breastfeeding;Primapara;Early term 37-38.6wks;Infant weight loss;Other (Comment) (2 % weight loss, per mom baby recently fed. LC reviewed doc flow sheets and updated per mom . per mom working on the latching with the #20 NS and its comfortable / working better) ?Age:32 hours ?LC reviewed and recommended since the [facility] plan includes a NS and pumping.  ?Feed the baby the 1st breast 15 -20 mins with the NS and after the breast supplement with 30 ml of EBM or formula.  ?Post pump both breast for 10 -15 mins / save milk for the next feeding.  ?LC recommended LC O/P appt and mom receptive.  ?Mom aware the [facility] O/P consultant will call her to set up the appt.  ?Mom also going to check at her Ped[facility] office for [facility] O/P services.  ?LC Reviewed BF D/C teaching.  ? ?Maternal Data ?  ? ?Feeding ?Mother's Current Feeding Choice: Breast Milk and Formula ?Nipple Type: Nfant Standard Flow (white) ? ?LATCH Score ?  ? ?  ? ?  ? ?  ? ?  ? ?  ? ? ?Lactation Tools Discussed/Used ?Tools: Shells;Pump ?Nipple shield size: 20 ?Breast pump type: Double-Electric Breast Pump;Manual ?Pump Education: Milk Storage ? ?Interventions ?Interventions: Breast feeding basics reviewed;DEBP;Education;LC Services brochure ? ?Discharge ?Discharge Education: Engorgement and breast care;Warning signs for feeding baby;Outpatient recommendation;Outpatient Epic message sent ?Pump: DEBP;Manual;Personal ?WIC Program: No ? ?Consult Status ?Consult Status: Complete ?Date: 09/20/21 ? ? ? ?Susan Chandler ?09/20/2021, 12:29 PM ? ? ? ?

## 2021-09-21 ENCOUNTER — Encounter (HOSPITAL_COMMUNITY): Admission: RE | Admit: 2021-09-21 | Payer: 59 | Source: Ambulatory Visit

## 2021-09-21 ENCOUNTER — Other Ambulatory Visit: Payer: 59

## 2021-09-24 ENCOUNTER — Encounter: Payer: Self-pay | Admitting: Obstetrics & Gynecology

## 2021-09-24 ENCOUNTER — Ambulatory Visit (INDEPENDENT_AMBULATORY_CARE_PROVIDER_SITE_OTHER): Payer: 59 | Admitting: Obstetrics & Gynecology

## 2021-09-24 VITALS — BP 134/94 | HR 90 | Wt 206.0 lb

## 2021-09-24 DIAGNOSIS — O165 Unspecified maternal hypertension, complicating the puerperium: Secondary | ICD-10-CM

## 2021-09-24 MED ORDER — LISINOPRIL 5 MG PO TABS: 5.0000 mg | ORAL_TABLET | Freq: Every day | ORAL | 1 refills | Status: DC

## 2021-09-24 MED ORDER — HYDROCHLOROTHIAZIDE 25 MG PO TABS: 25.0000 mg | ORAL_TABLET | Freq: Every day | ORAL | 0 refills | Status: DC

## 2021-09-24 NOTE — Progress Notes (Signed)
?  [id number] returns for routine postoperative follow-up having undergone 09/17/21 on primary Caesarean section.  ?The patient's immediate postoperative recovery has been unremarkable. ?Since hospital discharge the patient reports  no problems ? ?Had GHTN, BP a bit elevated today. ? ? ?Current Outpatient Medications: ?hydrochlorothiazide (HYDRODIURIL) 25 MG tablet, Take 1 tablet (25 mg total) by mouth daily., Disp: 5 tablet, Rfl: 0 ?lisinopril (ZESTRIL) 5 MG tablet, Take 1 tablet (5 mg total) by mouth daily., Disp: 30 tablet, Rfl: 1 ?ibuprofen (ADVIL) 600 MG tablet, Take 1 tablet (600 mg total) by mouth every 6 (six) hours., Disp: 30 tablet, Rfl: 0 ?oxyCODONE (OXY IR/ROXICODONE) 5 MG immediate release tablet, Take 1 tablet (5 mg total) by mouth every 6 (six) hours as needed for moderate pain. (Patient not taking: Reported on 09/24/2021), Disp: 20 tablet, Rfl: 0 ? ?No current facility-administered medications for this visit. ? ? ? ?Blood pressure (!) 134/94, pulse 90, weight 206 lb (93.4 kg), currently breastfeeding. ? ?Physical Exam: ?Normal incision healing well ?Abdomen is benign ? ?Diagnostic Tests: ? ? ?Pathology: ? ? 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  Recheck BP next week via MyChart nurse visit ? ? ? ? ?Medications Prescribed this encounter: ?Orders Placed This Encounter ?    lisinopril (ZESTRIL) 5 MG tablet ?        Sig: Take 1 tablet (5 mg total) by mouth daily. ?        Dispense:  30 tablet ?        Refill:  1 ?    hydrochlorothiazide (HYDRODIURIL) 25 MG tablet ?        Sig: Take 1 tablet (25 mg total) by mouth daily. ?        Dispense:  5 tablet ?        Refill:  0 ? ? ? ? ?Follow up: ?Return in about 1 week (around 10/01/2021) for Nurse only, BP check.  ? ? ?Florian Buff, MD ?Attending Physician for the Center for [facility] and ?[facility] ?09/24/2021 ?11:19 AM ? ? ? ? ?

## 2021-09-25 ENCOUNTER — Other Ambulatory Visit: Payer: 59 | Admitting: Obstetrics & Gynecology

## 2021-09-25 ENCOUNTER — Encounter: Payer: Self-pay | Admitting: Obstetrics & Gynecology

## 2021-09-25 ENCOUNTER — Encounter: Payer: 59 | Admitting: Women's Health

## 2021-10-01 ENCOUNTER — Telehealth (INDEPENDENT_AMBULATORY_CARE_PROVIDER_SITE_OTHER): Payer: 59 | Admitting: *Deleted

## 2021-10-01 VITALS — BP 121/83 | HR 92

## 2021-10-01 DIAGNOSIS — Z0130 Encounter for examination of blood pressure without abnormal findings: Secondary | ICD-10-CM

## 2021-10-01 NOTE — Progress Notes (Signed)
? ?  NURSE VISIT- BLOOD PRESSURE CHECK ? ?SUBJECTIVE:  ?Susan Chandler is a 31 y.o. G39P1001 female here for BP check. She is postpartum, delivery date 09/17/21    ? ?HYPERTENSION ROS: ? ?Pregnant/postpartum:  ?Severe headaches that don't go away with tylenol/other medicines: No  ?Visual changes (seeing spots/double/blurred vision) No  ?Severe pain under right breast breast or in center of upper chest No  ?Severe nausea/vomiting No  ?Taking medicines as instructed yes ? ? ? ?OBJECTIVE:  ?BP 121/83   Pulse 92   ?Appearance alert, well appearing, and in no distress. ? ?ASSESSMENT: ?Postpartum  blood pressure check ? ?PLAN: ?Note routed to Dr. Despina Hidden   ?Recommendations: no changes needed   ?Follow-up: as scheduled  ? ?Annamarie Dawley  ?10/01/2021 ?2:54 PM  ?

## 2021-10-02 ENCOUNTER — Encounter: Payer: 59 | Admitting: Women's Health

## 2021-10-04 ENCOUNTER — Encounter: Payer: Self-pay | Admitting: Obstetrics & Gynecology

## 2021-10-08 ENCOUNTER — Encounter: Payer: Self-pay | Admitting: Women's Health

## 2021-10-13 ENCOUNTER — Encounter: Payer: Self-pay | Admitting: Women's Health

## 2021-10-16 ENCOUNTER — Other Ambulatory Visit: Payer: Self-pay | Admitting: Obstetrics & Gynecology

## 2021-10-18 ENCOUNTER — Other Ambulatory Visit: Payer: Self-pay

## 2021-10-18 ENCOUNTER — Encounter (HOSPITAL_COMMUNITY): Payer: Self-pay

## 2021-10-18 ENCOUNTER — Emergency Department (HOSPITAL_COMMUNITY)
Admission: EM | Admit: 2021-10-18 | Discharge: 2021-10-18 | Disposition: A | Discharge status: Home / Self Care | Payer: 59 | Attending: Emergency Medicine | Admitting: Emergency Medicine

## 2021-10-18 DIAGNOSIS — R000 Tachycardia, unspecified: Secondary | ICD-10-CM | POA: Diagnosis not present

## 2021-10-18 DIAGNOSIS — N61 Mastitis without abscess: Secondary | ICD-10-CM | POA: Diagnosis not present

## 2021-10-18 DIAGNOSIS — N644 Mastodynia: Secondary | ICD-10-CM | POA: Diagnosis present

## 2021-10-18 MED ORDER — CEFTRIAXONE SODIUM 1 G IJ SOLR
1.0000 g | Freq: Once | INTRAMUSCULAR | Status: AC
Start: 2021-10-18 — End: 2021-10-18
  Administered 2021-10-18: 1 g via INTRAMUSCULAR
  Filled 2021-10-18: qty 10

## 2021-10-18 MED ORDER — CEPHALEXIN 500 MG PO CAPS: 500.0000 mg | ORAL_CAPSULE | Freq: Four times a day (QID) | ORAL | 0 refills | Status: DC

## 2021-10-18 MED ORDER — STERILE WATER FOR INJECTION IJ SOLN
INTRAMUSCULAR | Status: AC
  Filled 2021-10-18: qty 10

## 2021-10-18 MED ORDER — ACETAMINOPHEN 500 MG PO TABS
1000.0000 mg | ORAL_TABLET | Freq: Once | ORAL | Status: AC
Start: 2021-10-18 — End: 2021-10-18
  Administered 2021-10-18: 1000 mg via ORAL
  Filled 2021-10-18: qty 2

## 2021-10-18 NOTE — ED Provider Notes (Signed)
?Susan Chandler ?Provider Note ? ? ?CSN: [id number] ?Arrival date & time: 10/18/21  2223 ? ?  ? ?History ? ?Chief Complaint  ?Patient presents with  ? Breast Pain  ? ? ?[name] is a 31 y.o. female. ? ?Susan ? ?31 year old female who is currently breast-feeding a Chandler, Susan that she has been having some increasing pain in her left breast on the medial aspect of the breast with some increasing tenderness and some red streaking.  She has had subjective fevers and chills today but no nausea or vomiting.  She has not been on any antibiotics.  She wants to stop breast-feeding ? ?Home Medications ?Prior to Admission medications   ?Medication Sig Start Date End Date Taking? Authorizing Provider  ?cephALEXin (KEFLEX) 500 MG capsule Take 1 capsule (500 mg total) by mouth 4 (four) times daily. 10/18/21  Yes Susan Hong, MD  ?hydrochlorothiazide (HYDRODIURIL) 25 MG tablet Take 1 tablet (25 mg total) by mouth daily. ?Patient not taking: Reported on 10/01/2021 09/24/21   Susan Arms, MD  ?ibuprofen (ADVIL) 600 MG tablet Take 1 tablet (600 mg total) by mouth every 6 (six) hours. 09/20/21   Susan Chandler, CNM  ?lisinopril (ZESTRIL) 5 MG tablet Take 1 tablet (5 mg total) by mouth daily. 09/24/21   Susan Arms, MD  ?oxyCODONE (OXY IR/ROXICODONE) 5 MG immediate release tablet Take 1 tablet (5 mg total) by mouth every 6 (six) hours as needed for moderate pain. ?Patient not taking: Reported on 09/24/2021 09/20/21   Susan Chandler, CNM  ?   ? ?Allergies    ?Shellfish allergy   ? ?Review of Systems   ?Review of Systems  ?Constitutional:  Positive for chills and fever.  ?Skin:  Positive for rash.  ? ?Physical Exam ?Updated Vital Chandler ?BP 119/82   Pulse (!) 114   Temp 99 ?F (37.2 ?C) (Oral)   Resp 18   Ht 1.676 m (5\' 6" )   Wt 90.3 kg   SpO2 99%   BMI 32.12 kg/m?  ?Physical Exam ?Vitals and nursing note reviewed.  ?Constitutional:   ?   Appearance: She is well-developed. She is not diaphoretic.  ?HENT:   ?   Head: Normocephalic and atraumatic.  ?Eyes:  ?   General:     ?   Right eye: No discharge.     ?   Left eye: No discharge.  ?   Conjunctiva/sclera: Conjunctivae normal.  ?Cardiovascular:  ?   Rate and Rhythm: Tachycardia present.  ?Pulmonary:  ?   Effort: Pulmonary effort is normal. No respiratory distress.  ?   Comments: With chaperone present the patient's breasts were Chandler, Susan Chandler with milk, Susan both have milk coming from the nipple, there is a slight area of redness and tenderness and swelling of the left medial breast just medial to the areola.  There is no lymphadenopathy in the axillary regions. ?Skin: ?   General: Skin is warm and dry.  ?   Findings: No erythema or rash.  ?Neurological:  ?   Mental Status: She is alert.  ?   Coordination: Coordination normal.  ? ? ?ED Results / Procedures / Treatments   ?Labs ?(all labs ordered are listed, but only abnormal results are displayed) ?Labs Reviewed - No data to display ? ?EKG ?None ? ?Radiology ?No results found. ? ?Procedures ?Procedures  ? ? ?Medications Ordered in ED ?Medications  ?acetaminophen (TYLENOL) tablet 1,000 mg (has no administration in time range)  ?  cefTRIAXone (ROCEPHIN) injection 1 g (has no administration in time range)  ? ? ?ED Course/ Medical Decision Making/ A&P ?  ?                        ?Medical Decision Making ?Risk ?OTC drugs. ?Prescription drug management. ? ? ?This patient is mildly tachycardic, and has a picture consistent with mastitis but is otherwise well-appearing.  She is not febrile here with a temperature of 99 and a normal blood pressure.  We will treat with cephalosporin medications and discharge home, patient is in total agreement with this plan and amenable to the treatment plan, she has been given Rocephin prior to discharge, cephalexin for home. ? ? ? ? ? ? ? ?Final Clinical Impression(s) / ED Diagnoses ?Final diagnoses:  ?Mastitis in female  ? ? ?Rx / DC Orders ?ED Discharge Orders   ? ?       Ordered  ?  cephALEXin (KEFLEX) 500 MG capsule  4 times daily       ? 10/18/21 2253  ? ?  ?  ? ?  ? ? ?  ?Susan Hong, MD ?10/18/21 2254 ? ?

## 2021-10-18 NOTE — Discharge Instructions (Signed)
Your examination is consistent with mastitis.  Because of that she will need to take an antibiotic for the next 10 days, cephalexin 4 times daily, do not miss any doses, take the entire 10 days with the medicine, see your doctor within 2 days for recheck, emergency department for severe pain fever or spreading redness ? ?You may take Tylenol and ibuprofen alternating every 4 hours, 650 mg of Tylenol, 600 mg of ibuprofen. ? ? ?

## 2021-10-18 NOTE — ED Notes (Signed)
Pt has had no adverse rx from IM rocephin ?

## 2021-10-18 NOTE — ED Triage Notes (Signed)
Reports L breast pain after attempting to stop breast feeding.  Reports swollen and tender.  Denies fever.  Denies colored drainage.   ?

## 2021-10-19 ENCOUNTER — Encounter: Payer: Self-pay | Admitting: Women's Health

## 2021-10-29 ENCOUNTER — Encounter: Payer: Self-pay | Admitting: Women's Health

## 2021-10-29 ENCOUNTER — Ambulatory Visit (INDEPENDENT_AMBULATORY_CARE_PROVIDER_SITE_OTHER): Payer: 59 | Admitting: Women's Health

## 2021-10-29 DIAGNOSIS — Z8632 Personal history of gestational diabetes: Secondary | ICD-10-CM

## 2021-10-29 DIAGNOSIS — Z30015 Encounter for initial prescription of vaginal ring hormonal contraceptive: Secondary | ICD-10-CM

## 2021-10-29 DIAGNOSIS — F53 Postpartum depression: Secondary | ICD-10-CM | POA: Insufficient documentation

## 2021-10-29 MED ORDER — ETONOGESTREL-ETHINYL ESTRADIOL 0.12-0.015 MG/24HR VA RING: VAGINAL_RING | VAGINAL | 12 refills | Status: DC

## 2021-10-29 NOTE — Progress Notes (Signed)
POSTPARTUM VISIT Patient name: Susan Chandler MRN [id number]  Date of birth: 01-09-1991 Chief Complaint:   Postpartum Care (Discuss contraceptive)  History of Present Illness:   Susan Chandler is a 31 y.o. G39P1001 Caucasian female being seen today for a postpartum visit. She is 6 weeks postpartum following a primary cesarean section, low transverse incision d/t GHTN w/ breech presentation at 37.3 gestational weeks. IOL: no, for n/a. Anesthesia: spinal.  Laceration: n/a.  Complications: none. Inpatient contraception: no.   Pregnancy complicated by GHTN, A1DM, breech presentation . Tobacco use: no. Substance use disorder: no. Last pap smear: 09/18/20 and results were NILM w/ HRHPV negative. Next pap smear due: 2025 No LMP recorded.  Postpartum course has been complicated by PPHTN- not d/c'd on meds, started lisinopril 5mg  and hctz 25mg  at 1wk bp check, has since stopped both. Mastitis, s/p antibiotics, still has some pain on that side. No fever/chills.  Bleeding spotting. Bowel function is normal. Bladder function is normal. Urinary incontinence? no, fecal incontinence? no Patient is not sexually active. Last sexual activity: prior to birth of baby. Desired contraception:  nuvaring- did well w/ it in past . Does not smoke, no h/o HTN, DVT/PE, CVA, MI, or migraines w/ aura.  Patient  may  want a pregnancy in the future.  Desired family size is 2 children.   Upstream - 10/29/21 1032       Pregnancy Intention Screening   Does the patient want to become pregnant in the next year? No    Does the patient's partner want to become pregnant in the next year? No    Would the patient like to discuss contraceptive options today? Yes      Contraception Wrap Up   Current Method Abstinence    End Method Abstinence    Contraception Counseling Provided Yes            The pregnancy intention screening data noted above was reviewed. Potential methods of contraception were discussed. The patient elected  to proceed with Abstinence.  Edinburgh Postpartum Depression Screening: equivocal, worse at night. Husband going back to work. Appetite ok, sleeping ok. Denies SI/HI/II. Declines meds, is ok w/ IBH referral  Edinburgh Postnatal Depression Scale - 10/29/21 1028       Edinburgh Postnatal Depression Scale:  In the Past 7 Days   I have been able to laugh and see the funny side of things. 0    I have looked forward with enjoyment to things. 0    I have blamed myself unnecessarily when things went wrong. 2    I have been anxious or worried for no good reason. 3    I have felt scared or panicky for no good reason. 3    Things have been getting on top of me. 1    I have been so unhappy that I have had difficulty sleeping. 0    I have felt sad or miserable. 0    I have been so unhappy that I have been crying. 1    The thought of harming myself has occurred to me. 0    Edinburgh Postnatal Depression Scale Total 10                07/10/2021    9:10 AM 04/05/2021    3:17 PM 09/18/2020    8:33 AM  GAD 7 : Generalized Anxiety Score  Nervous, Anxious, on Edge 0 0 1  Control/stop worrying 0 0 1  Worry too much -  different things 0 0 1  Trouble relaxing 0 0 1  Restless 0 0 1  Easily annoyed or irritable 0 0 1  Afraid - awful might happen 0 0 1  Total GAD 7 Score 0 0 7     Baby's course has been uncomplicated. Baby is feeding by bottle. Infant has a pediatrician/family doctor? Yes.  Childcare strategy if returning to work/school: n/a-stay at home mom.  Pt has material needs met for her and baby: Yes.   Review of Systems:   Pertinent items are noted in HPI Denies Abnormal vaginal discharge w/ itching/odor/irritation, headaches, visual changes, shortness of breath, chest pain, abdominal pain, severe nausea/vomiting, or problems with urination or bowel movements. Pertinent History Reviewed:  Reviewed past medical,surgical, obstetrical and family history.  Reviewed problem list, medications  and allergies. OB History  Gravida Para Term Preterm AB Living  1 1 1  0 0 1  SAB IAB Ectopic Multiple Live Births  0 0 0 0 1    # Outcome Date GA Lbr Len/2nd Weight Sex Delivery Anes PTL Lv  1 Term 09/17/21 [redacted]w[redacted]d  7 lb 1.2 oz (3.21 kg) M CS-LTranv Spinal  LIV   Physical Assessment:   Vitals:   10/29/21 1022  BP: 103/78  Pulse: 77  Weight: 198 lb 9.6 oz (90.1 kg)  Height: 5\' 6"  (1.676 m)  Body mass index is 32.05 kg/m.       Physical Examination:   General appearance: alert, well appearing, and in no distress  Mental status: alert, oriented to person, place, and time  Skin: warm & dry   Cardiovascular: normal heart rate noted   Respiratory: normal respiratory effort, no distress   Breasts: Lt breast w/ firmness @ 10-11 oclock, no erythema/induration/streaks  Abdomen: soft, non-tender, c/s incision well healed  Pelvic: examination not indicated. Thin prep pap obtained: No  Rectal: not examined  Extremities: Edema: none   Chaperone: N/A         No results found for this or any previous visit (from the past 24 hour(s)).  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  Mood and well being:  If positive depression screen, discussed and plan developed.  If using tobacco we discussed reduction/cessation and risk of relapse If current substance abuse, we discussed and referral to local resources was offered.    2. Infant care and feeding:  If breastfeeding, discussed returning to work, pumping, breastfeeding-associated pain, guidance regarding return to fertility while lactating if not using another method. If needed, patient was provided with a letter to be allowed to pump q 2-3hrs to support lactation in a private location with access to a refrigerator to store breastmilk.   Recommended that all caregivers be immunized for flu, pertussis and other preventable communicable diseases If pt does not have material needs met for her/baby, referred to local resources for help obtaining these.  3. Sexuality, contraception and birth spacing Provided guidance regarding sexuality, management  of dyspareunia, and resumption of intercourse Discussed avoiding interpregnancy interval <63mths and recommended birth spacing of 18 months  4. Sleep and fatigue Discussed coping options for fatigue and sleep disruption Encouraged family/partner/community support of 4 hrs of uninterrupted sleep to help with mood and fatigue  5. Physical recovery  If pt had a C/S, assessed incisional pain and providing guidance on normal vs prolonged recovery If pt had a laceration, perineal healing and pain reviewed.  If urinary or fecal incontinence, discussed management and referred to PT or uro/gyn if indicated  Patient is safe to resume physical activity. Discussed attainment of healthy weight.  6.  Chronic disease management Discussed pregnancy complications if any, and their implications for future childbearing and long-term maternal health. Review recommendations for prevention of recurrent pregnancy complications, such as 17 hydroxyprogesterone caproate to reduce risk for recurrent PTB not applicable, or aspirin to reduce risk of preeclampsia yes. Pt had GDM: yes. If yes, 2hr GTT scheduled: yes. Reviewed medications and non-pregnant dosing including consideration of whether pt is breastfeeding using a reliable resource such as  LactMed: not applicable Referred for f/u w/ PCP or subspecialist providers as indicated: not applicable  7. Health maintenance Mammogram at 32yo or earlier if indicated Pap smears as indicated  Meds:  Meds ordered this encounter  Medications   etonogestrel-ethinyl estradiol (NUVARING) 0.12-0.015 MG/24HR vaginal ring    Sig: Insert vaginally and leave in place for 3 consecutive weeks, then remove for 1 week.    Dispense:  1 each    Refill:  12    Order Specific Question:   Supervising Provider    Answer:   Lazaro Arms [[id number]]    Follow-up: Return in about 2 weeks (around 11/12/2021) for 2hr postpartum sugar test (no visit), then from now for med f/u in office.   Orders Placed This Encounter  Procedures   Amb ref to [facility]    Cheral Marker CNM, [facility] 10/29/2021 12:04 PM

## 2021-10-29 NOTE — Patient Instructions (Signed)
You will have your sugar test next visit.  Please do not eat or drink anything after midnight the night before you come, not even water.  You will be here for at least two hours.  Please make an appointment online for the bloodwork at [email or web address] for 8:30am (or as close to this as possible). Make sure you select the [address] service center. The day of the appointment, check in with our office first, then you will go to [facility] to start the sugar test.   

## 2021-11-05 ENCOUNTER — Encounter: Payer: Self-pay | Admitting: Women's Health

## 2021-11-07 ENCOUNTER — Ambulatory Visit (INDEPENDENT_AMBULATORY_CARE_PROVIDER_SITE_OTHER): Payer: 59 | Admitting: Family Medicine

## 2021-11-07 VITALS — BP 101/70 | HR 71 | Temp 98.1°F | Ht 78.0 in | Wt 198.0 lb

## 2021-11-07 DIAGNOSIS — N644 Mastodynia: Secondary | ICD-10-CM | POA: Diagnosis not present

## 2021-11-07 DIAGNOSIS — N61 Mastitis without abscess: Secondary | ICD-10-CM | POA: Diagnosis not present

## 2021-11-07 MED ORDER — SULFAMETHOXAZOLE-TRIMETHOPRIM 800-160 MG PO TABS: 1.0000 | ORAL_TABLET | Freq: Two times a day (BID) | ORAL | 0 refills | Status: DC

## 2021-11-07 NOTE — Progress Notes (Signed)
Susan Chandler , [birth year], 31 y.o., female MRN: [id number] Patient Care Team    Relationship Specialty Notifications Start End  McGowen, Maryjean Morn, MD PCP - General Family Medicine  10/18/13   Jene Every, MD Consulting Physician Orthopedic Surgery  12/21/13   Neysa Hotter, MD Consulting Physician Psychiatry  04/05/16   Lazaro Arms, MD Consulting Physician Obstetrics and Gynecology  08/10/18   Malissa Hippo, MD Consulting Physician Gastroenterology  10/22/18     Chief Complaint  Patient presents with   Breast Pain    Pt c/o b/l breast pain L> x 3 weeks; reoccurring in last week     Subjective: Pt presents for an OV with complaints of breast pain.  Patient recently gave birth to her son.  She is not breast-feeding.  She was diagnosed with mastitis approximately 3 weeks ago and treated with Keflex 500 mg 4 times daily x10 days.  She reports her symptoms all resolved with the use of the antibiotic.  She followed up with her gynecology team shortly after completing antibiotic.  She had no symptoms at that time.  However over the last week she has noticed in the same area as she is getting discomfort and feelings of "electric shock "in her breast.  She points to an area in her left upper breast quadrant as location.  She also states she had mild similar sensations in the right upper breast above the nipple.     07/26/2021    8:37 AM 07/10/2021    9:09 AM 04/05/2021    3:16 PM 01/26/2021    3:09 PM 09/18/2020    8:30 AM  Depression screen PHQ 2/9  Decreased Interest 0 0 0 1 1  Down, Depressed, Hopeless 0 0 0 1 1  PHQ - 2 Score 0 0 0 2 2  Altered sleeping  1 0 1 1  Tired, decreased energy  0 0 1 1  Change in appetite  1 0 1 1  Feeling bad or failure about yourself   0 0 1 0  Trouble concentrating  0 0 0 1  Moving slowly or fidgety/restless  0 0 0 0  Suicidal thoughts  0 0 0 0  PHQ-9 Score  2 0 6 6  Difficult doing work/chores    Somewhat difficult     Allergies  Allergen  Reactions   Shellfish Allergy Nausea And Vomiting   Social History   Social History Narrative   Married, no children.   College: [employer].   Occupation: Used to work at [employer] in [employer]   Moved to [place], [place] in 2015.   No T/A/Ds.   No exercise.   Past Medical History:  Diagnosis Date   Abdominal pain    Persistent Mar/Apr 2020: w/u pretty unrevealing by me (McGowen); pt to see GI 10/21/18.   Abnormal uterine bleeding    Dr. Despina Hidden. Nuvaring as of 09/2019   Bipolar disorder (HCC)    r/o OCD per psychiatrist 03/2016.  Also, psychologist eval revealed suspected Bipolar II, depressed phase 04/16/2016.   Chronic pain syndrome    Dysmenorrhea    mgmt per Dr. Despina Hidden (01/2018).  Nuva Ring vag inserts 07/2018.   Erosive gastropathy 10/2018   Nonbleeding (EGD 10/2018)   GAD (generalized anxiety disorder)    Gestational diabetes    Infectious mononucleosis 2007   Lumbosacral radiculopathy at L5 2015   [facility]; then 2nd opinion with Dr. Shelle Iron at  [facility], where she got ESI left L5-S1 11/2013 with moderate improvement.  Saw neurosurgeon 01/2016: surgery NOT recommended.   Severe recurrence 01/2021->rpt MRI w/out neural compression->plan for ESI L5-S1 left.   Migraine syndrome    Obesity, Class I, BMI 30-34.9    Pre-syncope 2016   ? POTS.  Holter showed mostly NSR, occ PACs, one run of nonsustained atrial tachy.   Pregnancy induced hypertension    Salmonella enteritis 09/2017   Scoliosis    Past Surgical History:  Procedure Laterality Date   BIOPSY  10/26/2018   Procedure: BIOPSY;  Surgeon: Malissa Hippo, MD;  [facility];  Service: Endoscopy;;   CESAREAN SECTION N/A 09/17/2021   Procedure: CESAREAN SECTION;  Surgeon: Venora Maples, MD;  [facility]: [facility];  Service: Obstetrics;  Laterality: N/A;   ESOPHAGOGASTRODUODENOSCOPY  10/2018   Nonbleeding erosive gastropathy, H pylori NEG.   ESOPHAGOGASTRODUODENOSCOPY (EGD) WITH PROPOFOL N/A  10/26/2018   Procedure: ESOPHAGOGASTRODUODENOSCOPY (EGD) WITH PROPOFOL;  Surgeon: Malissa Hippo, MD;  [facility];  Service: Endoscopy;  Laterality: N/A;   WISDOM TOOTH EXTRACTION     Family History  Problem Relation Age of Onset   Bipolar disorder Father    Bipolar disorder Sister    Other Sister        e coli   Cancer Maternal Aunt    Alcohol abuse Maternal Grandfather    Allergies as of 11/07/2021       Reactions   Shellfish Allergy Nausea And Vomiting        Medication List        Accurate as of Nov 07, 2021 11:59 PM. If you have any questions, ask your nurse or doctor.          STOP taking these medications    cephALEXin 500 MG capsule Commonly known as: KEFLEX   hydrochlorothiazide 25 MG tablet Commonly known as: HYDRODIURIL   ibuprofen 600 MG tablet Commonly known as: ADVIL   lisinopril 5 MG tablet Commonly known as: ZESTRIL   oxyCODONE 5 MG immediate release tablet Commonly known as: Oxy IR/ROXICODONE       TAKE these medications    etonogestrel-ethinyl estradiol 0.12-0.015 MG/24HR vaginal ring Commonly known as: NUVARING Insert vaginally and leave in place for 3 consecutive weeks, then remove for 1 week.   sulfamethoxazole-trimethoprim 800-160 MG tablet Commonly known as: BACTRIM DS Take 1 tablet by mouth 2 (two) times daily.        All past medical history, surgical history, allergies, family history, immunizations andmedications were updated in the EMR today and reviewed under the history and medication portions of their EMR.     ROS Negative, with the exception of above mentioned in HPI   Objective:  BP 101/70   Pulse 71   Temp 98.1 F (36.7 C) (Oral)   Ht 6\' 6"  (1.981 m)   Wt 198 lb (89.8 kg)   SpO2 97%   Breastfeeding No   BMI 22.88 kg/m  Body mass index is 22.88 kg/m. Physical Exam Vitals and nursing note reviewed.  Constitutional:      General: She is not in acute distress.    Appearance: Normal  appearance. She is normal weight. She is not ill-appearing or toxic-appearing.  Eyes:     Extraocular Movements: Extraocular movements intact.     Conjunctiva/sclera: Conjunctivae normal.     Pupils: Pupils are equal, round, and reactive to light.  Chest:  Breasts:    Right: Tenderness present.  No swelling, bleeding, inverted nipple, mass, nipple discharge or skin change.     Left: Skin change and tenderness present. No swelling, bleeding, inverted nipple, mass or nipple discharge.    Neurological:     Mental Status: She is alert and oriented to person, place, and time. Mental status is at baseline.  Psychiatric:        Mood and Affect: Mood normal.        Behavior: Behavior normal.        Thought Content: Thought content normal.        Judgment: Judgment normal.     No results found. No results found. No results found for this or any previous visit (from the past 24 hour(s)).  Assessment/Plan: Susan Chandler is a 31 y.o. female present for OV for  Breast pain/Mastitis Probable mild mastitis reoccurring.  Since she was treated with Keflex with reoccurrence we will switch antibiotic regimen to Bactrim DS twice daily x10 days. Her vitals are stable. Discussed use of heat/ice, hydrate, anti-inflammatories as needed since she is not breast-feeding and continues of cabbage leaves until milk completely dried.  Reviewed expectations re: course of current medical issues. Discussed self-management of symptoms. Outlined signs and symptoms indicating need for more acute intervention. Patient verbalized understanding and all questions were answered. Patient received an After-Visit Summary.    No orders of the defined types were placed in this encounter.  Meds ordered this encounter  Medications   sulfamethoxazole-trimethoprim (BACTRIM DS) 800-160 MG tablet    Sig: Take 1 tablet by mouth 2 (two) times daily.    Dispense:  20 tablet    Refill:  0   Referral Orders  No referral(s)  requested today   33 minutes spent providing patient counseling and care.    Note is dictated utilizing voice recognition software. Although note has been proof read prior to signing, occasional typographical errors still can be missed. If any questions arise, please do not hesitate to call for verification.   electronically signed by:  Felix Pacinienee Desteni Piscopo, DO  [facility] - [place]

## 2021-11-07 NOTE — Patient Instructions (Signed)
Mastitis  Mastitis is irritation and swelling (inflammation) in an area of the breast. This most often happens in women who are breastfeeding, but it can happen to other women too, as well as some men. A doctor will help decide if treatment is needed. What are the causes? This condition is caused by: Germs (bacteria). This can happen when germs enter the breast through cuts or openings in the skin. A plugged milk duct. This happens when something blocks the flow of milk in the breast. Nipple piercing. The pierced area can allow germs to enter the breast. Some types of breast cancer. What are the signs or symptoms? Swelling, redness, and pain in the breast. Swelling of the glands under the arm. Fluid flowing from the nipple. Feeling very tired. Headache and body aches. Fever and chills. Vomiting or feeling like you may vomit. Fast heart rate. Symptoms often last 2-5 days. The pain and redness can be the worst on days 2 and 3. This will often go away by day 5. If the infection is not treated, pus or a pocket of fluid may form under the skin (abscess). How is this diagnosed? This condition can usually be diagnosed based on a physical exam and your symptoms. You may also have other tests, such as: Blood tests to check if your body is fighting infection. X-rays or ultrasounds of the breast. Fluid tests. If a pocket of fluid has formed, the fluid may be taken out with a needle. The fluid may be checked to see whether germs are present. Breast milk testing. A sample of breast milk may be tested for germs. This is done only when breastfeeding or pumping. How is this treated? Mastitis will sometimes go away on its own, so your doctor may choose to wait 24 hours after first seeing you to decide if medicine is needed. This condition may be treated with: Continuing to breastfeed or pump from both breasts to allow milk flow and prevent a pocket of fluid from forming. Using hot or cold  compresses. Taking medicine for pain. Taking antibiotic medicine. Rest. Drinking plenty of fluids. Taking out fluid with a needle, if a pocket of fluid has formed. Follow these instructions at home: Breast care  Keep your nipples clean and dry. If told, put heat on the affected area of your breast. Use the heat source that your doctor or lactation specialist tells you to use. If told, put ice on the affected area of your breast. To do this: Put ice in a plastic bag. Place a towel between your skin and the bag. Leave the ice on for 20 minutes, 2-3 times a day. Take off the ice if your skin turns bright red. This is very important. If you cannot feel pain, heat, or cold, you have a greater risk of damage to the area. Breastfeeding and pumping tips Continue to breastfeed your baby on demand. This means feeding your baby whenever he or she is hungry. Ask your doctor or lactation specialist whether you need to change your breastfeeding routine. Avoid using nipple shields, if possible. Ask a lactation specialist for help. Change the breast you offer first at each feeding to make sure your baby feeds from both breasts. Offer both breasts to your baby every time your baby feeds. Use gentle breast massage during feeding or pumping sessions only as told by your doctor or lactation specialist. Avoid letting your breasts get very full with milk (engorged). If your breasts are very full, you can hand express a   small amount of milk for comfort. If you are pumping, keep pumping on the same schedule as you were before. In the breast with mastitis, pump until very little milk is coming out. Do not empty your breast. Emptying your breast causes your body to make more milk and can make symptoms worse. Ask your doctor or lactation specialist whether you need to change your pumping routine. Medicines Take over-the-counter and prescription medicines only as told by your doctor. If you were prescribed an  antibiotic medicine, take it as told by your doctor. Do not stop taking it even if you start to feel better. Contact a doctor if: You have pus-like fluid leaking from your breast. You have a fever. Your symptoms do not get better within 2 days of starting treatment. Get help right away if: Your pain and swelling are getting worse. Your pain is not helped by medicine. You have a red line going from your breast toward your armpit. Summary Mastitis is irritation and swelling in an area of the breast. Mastitis will sometimes go away on its own. Get plenty of rest. Contact a doctor if your symptoms do not get better within 2 days. If you were prescribed an antibiotic medicine, do not stop taking it even if you start to feel better. This information is not intended to replace advice given to you by your health care provider. Make sure you discuss any questions you have with your health care provider. Document Revised: 07/13/2021 Document Reviewed: 04/10/2020 Elsevier Patient Education  2023 Elsevier Inc.  

## 2021-11-08 ENCOUNTER — Encounter: Payer: Self-pay | Admitting: Family Medicine

## 2021-11-08 DIAGNOSIS — N61 Mastitis without abscess: Secondary | ICD-10-CM | POA: Insufficient documentation

## 2021-11-12 ENCOUNTER — Other Ambulatory Visit: Payer: 59

## 2021-11-12 DIAGNOSIS — Z8632 Personal history of gestational diabetes: Secondary | ICD-10-CM

## 2021-11-13 LAB — GLUCOSE TOLERANCE, 2 HOURS W/ 1HR
Glucose, 1 hour: 83 mg/dL (ref 70–179)
Glucose, 2 hour: 95 mg/dL (ref 70–152)
Glucose, Fasting: 91 mg/dL (ref 70–91)

## 2021-11-23 ENCOUNTER — Encounter: Payer: Self-pay | Admitting: Women's Health

## 2021-11-28 ENCOUNTER — Telehealth: Payer: Self-pay | Admitting: Licensed Clinical Social Worker

## 2021-11-28 NOTE — Telephone Encounter (Signed)
Called pt regarding [facility] referral. Left message requesting callback  °

## 2021-12-19 ENCOUNTER — Encounter: Payer: Self-pay | Admitting: Women's Health

## 2021-12-20 ENCOUNTER — Encounter: Payer: Self-pay | Admitting: Family Medicine

## 2021-12-24 ENCOUNTER — Other Ambulatory Visit: Payer: Self-pay | Admitting: Women's Health

## 2021-12-24 DIAGNOSIS — F418 Other specified anxiety disorders: Secondary | ICD-10-CM

## 2021-12-24 MED ORDER — SERTRALINE HCL 25 MG PO TABS: 25.0000 mg | ORAL_TABLET | Freq: Every day | ORAL | 6 refills | Status: DC

## 2021-12-26 ENCOUNTER — Encounter: Payer: Self-pay | Admitting: Family Medicine

## 2021-12-26 ENCOUNTER — Telehealth (INDEPENDENT_AMBULATORY_CARE_PROVIDER_SITE_OTHER): Payer: 59 | Admitting: Family Medicine

## 2021-12-26 VITALS — Wt 197.0 lb

## 2021-12-26 DIAGNOSIS — F411 Generalized anxiety disorder: Secondary | ICD-10-CM

## 2021-12-26 DIAGNOSIS — F53 Postpartum depression: Secondary | ICD-10-CM

## 2021-12-26 NOTE — Progress Notes (Signed)
Virtual Visit via Video Note  I connected with Susan Chandler on 12/26/21 at 10:20 AM EDT by a video enabled telemedicine application and verified that I am speaking with the correct person using two identifiers.  Location patient: Susan Chandler Location provider:work or home office Persons participating in the virtual visit: patient, provider  I discussed the limitations and requested verbal permission for telemedicine visit. The patient expressed understanding and agreed to proceed.  HPI: 31 y/o female being seen today for anxiety. [name] gave birth to a baby boy about 3 months ago.  This went well and everyone is healthy. She is formula feeding him. In the last month or so she is started to feel significant melancholy, feeling overwhelmed, having some crying spells and near panic, cannot relax, mind will not shut down.  This is particularly affecting her sleep.  She denies suicidal ideation or homicidal ideation. She followed up with her nurse midwife 2 days ago and was started on sertraline.  She underwent a my opinion on this plan.  ROS: See pertinent positives and negatives per HPI.  Past Medical History:  Diagnosis Date   Abdominal pain    Persistent Mar/Apr 2020: w/u pretty unrevealing by me (Susan Chandler); pt to see GI 10/21/18.   Abnormal uterine bleeding    Dr. Despina Chandler. Nuvaring as of 09/2019   Bipolar disorder (HCC)    r/o OCD per psychiatrist 03/2016.  Also, psychologist eval revealed suspected Bipolar II, depressed phase 04/16/2016.   Chronic pain syndrome    Dysmenorrhea    mgmt per Dr. Despina Chandler (01/2018).  Nuva Ring vag inserts 07/2018.   Erosive gastropathy 10/2018   Nonbleeding (EGD 10/2018)   GAD (generalized anxiety disorder)    Gestational diabetes    Infectious mononucleosis 2007   Lumbosacral radiculopathy at L5 2015   [facility]. [facility]ia Chandler at [facility]; then 2nd opinion with Dr. Shelle Chandler at [facility], where she got ESI left L5-S1 11/2013 with moderate improvement.  Saw neurosurgeon  01/2016: surgery NOT recommended.   Severe recurrence 01/2021->rpt MRI w/out neural compression->plan for ESI L5-S1 left.   Migraine syndrome    Obesity, Class I, BMI 30-34.9    Pre-syncope 2016   ? POTS.  Holter showed mostly NSR, occ PACs, one run of nonsustained atrial tachy.   Pregnancy induced hypertension    Salmonella enteritis 09/2017   Scoliosis     Past Surgical History:  Procedure Laterality Date   BIOPSY  10/26/2018   Procedure: BIOPSY;  Surgeon: Susan Hippo, MD;  [facility];  Service: Endoscopy;;   CESAREAN SECTION N/A 09/17/2021   Procedure: CESAREAN SECTION;  Surgeon: Susan Maples, MD;  Location: [facility];  Service: Obstetrics;  Laterality: N/A;   ESOPHAGOGASTRODUODENOSCOPY  10/2018   Nonbleeding erosive gastropathy, H pylori NEG.   ESOPHAGOGASTRODUODENOSCOPY (EGD) WITH PROPOFOL N/A 10/26/2018   Procedure: ESOPHAGOGASTRODUODENOSCOPY (EGD) WITH PROPOFOL;  Surgeon: Susan Hippo, MD;  [facility];  Service: Endoscopy;  Laterality: N/A;   WISDOM TOOTH EXTRACTION       Current Outpatient Medications:    sertraline (ZOLOFT) 25 MG tablet, Take 1 tablet (25 mg total) by mouth daily., Disp: 30 tablet, Rfl: 6   etonogestrel-ethinyl estradiol (NUVARING) 0.12-0.015 MG/24HR vaginal ring, Insert vaginally and leave in place for 3 consecutive weeks, then remove for 1 week., Disp: 1 each, Rfl: 12  EXAM:  VITALS per patient if applicable:     12/26/2021   10:09 AM 11/07/2021    2:37 PM 10/29/2021  10:22 AM  Vitals with BMI  Height  6\' 6"  5\' 6"   Weight 197 lbs 198 lbs 198 lbs 10 oz  BMI  22.89 32.07  Systolic  101 103  Diastolic  70 78  Pulse  71 77    GENERAL: alert, oriented, appears well and in no acute distress  HEENT: atraumatic, conjunttiva clear, no obvious abnormalities on inspection of external nose and ears  NECK: normal movements of the head and neck  LUNGS: on inspection no signs of respiratory distress, breathing rate appears  normal, no obvious gross SOB, gasping or wheezing  CV: no obvious cyanosis  MS: moves all visible extremities without noticeable abnormality  PSYCH/NEURO: pleasant and cooperative, no obvious depression or anxiety, speech and thought processing grossly intact  LABS: none today    Chemistry      Component Value Date/Time   NA 139 09/12/2021 2020   NA 140 09/11/2021 1224   K 4.1 09/12/2021 2020   CL 112 (H) 09/12/2021 2020   CO2 19 (L) 09/12/2021 2020   BUN 9 09/12/2021 2020   BUN 10 09/11/2021 1224   CREATININE 0.66 09/12/2021 2020      Component Value Date/Time   CALCIUM 8.5 (L) 09/12/2021 2020   ALKPHOS 154 (H) 09/12/2021 2020   AST 20 09/12/2021 2020   ALT 14 09/12/2021 2020   BILITOT 0.3 09/12/2021 2020   BILITOT <0.2 09/11/2021 1224     ASSESSMENT AND PLAN:  Discussed the following assessment and plan:  Postpartum anxiety and depression. I do support the use of sertraline.  Patient is comfortable with her nurse midwife managing this.  I gave encouragement today.  She is okay to take melatonin 10 mg nightly on the nights that her husband will be at home. She has the name and number of a counselor that she is going to contact. She has great support from her husband and family members.   I discussed the assessment and treatment plan with the patient. The patient was provided an opportunity to ask questions and all were answered. The patient agreed with the plan and demonstrated an understanding of the instructions.   F/u: as needed  Signed:  09/14/2021, MD           12/26/2021

## 2021-12-27 ENCOUNTER — Encounter (HOSPITAL_COMMUNITY): Payer: Self-pay

## 2021-12-27 ENCOUNTER — Other Ambulatory Visit: Payer: Self-pay

## 2021-12-27 DIAGNOSIS — R519 Headache, unspecified: Secondary | ICD-10-CM | POA: Insufficient documentation

## 2021-12-27 DIAGNOSIS — R110 Nausea: Secondary | ICD-10-CM | POA: Insufficient documentation

## 2021-12-27 MED ORDER — IBUPROFEN 400 MG PO TABS
400.0000 mg | ORAL_TABLET | Freq: Once | ORAL | Status: AC | PRN
  Administered 2021-12-27: 400 mg via ORAL
  Filled 2021-12-27: qty 1

## 2021-12-27 NOTE — ED Triage Notes (Signed)
Pt presents with severe HA that started today. Pt has 1 episode of vomiting. Pt denies photo/phonophobia. Pt states lying down makes HA worse. Pt also reports increased lumbar pain. Pt has tried Tylenol without relief.

## 2021-12-28 ENCOUNTER — Emergency Department (HOSPITAL_COMMUNITY)
Admission: EM | Admit: 2021-12-28 | Discharge: 2021-12-28 | Disposition: A | Discharge status: Home / Self Care | Payer: 59 | Attending: Emergency Medicine | Admitting: Emergency Medicine

## 2021-12-28 ENCOUNTER — Encounter: Payer: Self-pay | Admitting: Family Medicine

## 2021-12-28 ENCOUNTER — Emergency Department (HOSPITAL_COMMUNITY): Payer: 59

## 2021-12-28 DIAGNOSIS — R519 Headache, unspecified: Secondary | ICD-10-CM

## 2021-12-28 MED ORDER — SODIUM CHLORIDE 0.9 % IV BOLUS
1000.0000 mL | Freq: Once | INTRAVENOUS | Status: AC
  Administered 2021-12-28: 1000 mL via INTRAVENOUS

## 2021-12-28 MED ORDER — DIPHENHYDRAMINE HCL 50 MG/ML IJ SOLN
25.0000 mg | Freq: Once | INTRAMUSCULAR | Status: AC
  Administered 2021-12-28: 25 mg via INTRAVENOUS
  Filled 2021-12-28: qty 1

## 2021-12-28 MED ORDER — KETOROLAC TROMETHAMINE 30 MG/ML IJ SOLN
30.0000 mg | Freq: Once | INTRAMUSCULAR | Status: AC
  Administered 2021-12-28: 30 mg via INTRAVENOUS
  Filled 2021-12-28: qty 1

## 2021-12-28 MED ORDER — DEXAMETHASONE SODIUM PHOSPHATE 10 MG/ML IJ SOLN
10.0000 mg | Freq: Once | INTRAMUSCULAR | Status: AC
  Administered 2021-12-28: 10 mg via INTRAVENOUS
  Filled 2021-12-28: qty 1

## 2021-12-28 MED ORDER — METOCLOPRAMIDE HCL 5 MG/ML IJ SOLN
10.0000 mg | Freq: Once | INTRAMUSCULAR | Status: AC
  Administered 2021-12-28: 10 mg via INTRAVENOUS
  Filled 2021-12-28: qty 2

## 2021-12-28 NOTE — Telephone Encounter (Signed)
Anything else different since starting the sertraline? A severe headache is not likely a side effect with such a low dose but if you get another in the next 4-5d then stop the med and notify us and/or your nurse midwife.

## 2021-12-28 NOTE — ED Notes (Signed)
Patient transported to CT 

## 2021-12-28 NOTE — ED Provider Notes (Signed)
[facility] EMERGENCY DEPARTMENT Provider Note   CSN: [id number] Arrival date & time: 12/27/21  2210     History  Chief Complaint  Patient presents with   Headache    Susan Chandler is a 31 y.o. female.  Patient is a 31 year old female presenting with complaints of headache.  She describes pain to the back of her head that started earlier today.  This began in the absence of any injury or trauma.  She denies any visual disturbances, numbness, or tingling.  She has felt somewhat nauseated, but has not vomited.  She denies excessive alcohol or caffeine intake.  She does report recently being started on Zoloft.  She has been taking Tylenol and ibuprofen with little relief.  The history is provided by the patient.       Home Medications Prior to Admission medications   Medication Sig Start Date End Date Taking? Authorizing Provider  etonogestrel-ethinyl estradiol (NUVARING) 0.12-0.015 MG/24HR vaginal ring Insert vaginally and leave in place for 3 consecutive weeks, then remove for 1 week. 10/29/21   Cheral Marker, CNM  sertraline (ZOLOFT) 25 MG tablet Take 1 tablet (25 mg total) by mouth daily. 12/24/21   Cheral Marker, CNM      Allergies    Shellfish allergy    Review of Systems   Review of Systems  All other systems reviewed and are negative.   Physical Exam Updated Vital Signs BP (!) 106/59   Pulse 63   Temp 98 F (36.7 C) (Oral)   Resp 17   Ht 5\' 6"  (1.676 m)   Wt 90.7 kg   SpO2 100%   BMI 32.28 kg/m  Physical Exam Vitals and nursing note reviewed.  Constitutional:      General: She is not in acute distress.    Appearance: She is well-developed. She is not diaphoretic.  HENT:     Head: Normocephalic and atraumatic.  Eyes:     Extraocular Movements: Extraocular movements intact.     Pupils: Pupils are equal, round, and reactive to light.  Cardiovascular:     Rate and Rhythm: Normal rate and regular rhythm.     Heart sounds: No murmur heard.    No  friction rub. No gallop.  Pulmonary:     Effort: Pulmonary effort is normal. No respiratory distress.     Breath sounds: Normal breath sounds. No wheezing.  Abdominal:     General: Bowel sounds are normal. There is no distension.     Palpations: Abdomen is soft.     Tenderness: There is no abdominal tenderness.  Musculoskeletal:        General: Normal range of motion.     Cervical back: Normal range of motion and neck supple.  Skin:    General: Skin is warm and dry.  Neurological:     General: No focal deficit present.     Mental Status: She is alert and oriented to person, place, and time.     Cranial Nerves: No cranial nerve deficit or facial asymmetry.     Motor: No weakness.     ED Results / Procedures / Treatments   Labs (all labs ordered are listed, but only abnormal results are displayed) Labs Reviewed - No data to display  EKG None  Radiology CT Head Wo Contrast  Result Date: 12/28/2021 CLINICAL DATA:  Headaches for 1 day, initial encounter EXAM: CT HEAD WITHOUT CONTRAST TECHNIQUE: Contiguous axial images were obtained from the base of the skull through the  vertex without intravenous contrast. RADIATION DOSE REDUCTION: This exam was performed according to the departmental dose-optimization program which includes automated exposure control, adjustment of the mA and/or kV according to patient size and/or use of iterative reconstruction technique. COMPARISON:  None Available. FINDINGS: Brain: No evidence of acute infarction, hemorrhage, hydrocephalus, extra-axial collection or mass lesion/mass effect. Vascular: No hyperdense vessel or unexpected calcification. Skull: Normal. Negative for fracture or focal lesion. Sinuses/Orbits: No acute finding. Other: None. IMPRESSION: No acute intracranial abnormality noted. Electronically Signed   By: Alcide Clever M.D.   On: 12/28/2021 02:16    Procedures Procedures    Medications Ordered in ED Medications  ibuprofen (ADVIL) tablet 400  mg (400 mg Oral Given 12/27/21 2226)  sodium chloride 0.9 % bolus 1,000 mL (1,000 mLs Intravenous New Bag/Given 12/28/21 0140)  ketorolac (TORADOL) 30 MG/ML injection 30 mg (30 mg Intravenous Given 12/28/21 0130)  dexamethasone (DECADRON) injection 10 mg (10 mg Intravenous Given 12/28/21 0129)  metoCLOPramide (REGLAN) injection 10 mg (10 mg Intravenous Given 12/28/21 0130)  diphenhydrAMINE (BENADRYL) injection 25 mg (25 mg Intravenous Given 12/28/21 0130)    ED Course/ Medical Decision Making/ A&P  Patient presenting with complaints of headache that started earlier this evening.  She is neurologically intact and head CT shows no acute abnormality.  She is feeling better after receiving a migraine cocktail.  I see no indication for LP or further imaging.  Patient to be discharged with Tylenol/ibuprofen and as needed return.  I suspect some sort of a migraine phenomenon or possible side effect of Zoloft, but nothing emergent.  Final Clinical Impression(s) / ED Diagnoses Final diagnoses:  None    Rx / DC Orders ED Discharge Orders     None         Geoffery Lyons, MD 12/28/21 [phone]

## 2021-12-28 NOTE — Discharge Instructions (Signed)
Take Tylenol 1000 mg rotated with ibuprofen 600 mg every 4 hours as needed for pain.  Follow-up with primary doctor if symptoms persist, and return to the ER if symptoms significantly worsen or change. 

## 2021-12-31 NOTE — Telephone Encounter (Signed)
I recommend Tylenol 500 to 1000 mg

## 2021-12-31 NOTE — Telephone Encounter (Signed)
I prefer that you stick with acetaminophen when you are on Zoloft. However, it is okay to use the Aleve or ibuprofen as long as it is limited to 4 doses per week.

## 2022-01-01 ENCOUNTER — Encounter: Payer: Self-pay | Admitting: Family Medicine

## 2022-01-01 MED ORDER — HYDROXYZINE PAMOATE 25 MG PO CAPS: ORAL_CAPSULE | ORAL | 1 refills | Status: DC

## 2022-01-01 NOTE — Telephone Encounter (Signed)
Pt was seen on 7/5 for follow up.  Please review and advise

## 2022-01-01 NOTE — Telephone Encounter (Signed)
Stop all otc sleep meds. Pls send in rx for hydroxyzine 25mg , 1-3 tabs po qhs prn insomnia, #30, RF x 1.

## 2022-01-01 NOTE — Telephone Encounter (Signed)
Pt advised of med recommendations, rx sent in.

## 2022-01-07 ENCOUNTER — Encounter: Payer: Self-pay | Admitting: Women's Health

## 2022-01-08 ENCOUNTER — Encounter: Payer: Self-pay | Admitting: Adult Health

## 2022-01-08 ENCOUNTER — Encounter: Payer: Self-pay | Admitting: Family Medicine

## 2022-01-08 ENCOUNTER — Ambulatory Visit: Payer: 59 | Admitting: Adult Health

## 2022-01-08 VITALS — BP 132/87 | HR 87 | Ht 66.0 in | Wt 196.5 lb

## 2022-01-08 DIAGNOSIS — N6321 Unspecified lump in the left breast, upper outer quadrant: Secondary | ICD-10-CM | POA: Insufficient documentation

## 2022-01-08 NOTE — Progress Notes (Signed)
  Subjective:     Patient ID: Susan Chandler, female   DOB: 02/01/1991, 31 y.o.   MRN: [id number]  HPI [name] is a 31 year old white female, married, G1P1 in complaining of pulling sensation in left breast when lifts arm and tingling right breast, she had baby in March and tried to breat feed, but could not. She had mastitis 2 x in left breast she says.  Lab Results  Component Value Date   DIAGPAP  09/18/2020    - Negative for intraepithelial lesion or malignancy (NILM)   HPVHIGH Negative 09/18/2020   PCP is Dr Marvel Plan.  Review of Systems +pulling sensation left breast +right breast tingles    Reviewed past medical,surgical, social and family history. Reviewed medications and allergies.  Objective:   Physical Exam BP 132/87 (BP Location: Left Arm, Patient Position: Sitting, Cuff Size: Normal)   Pulse 87   Ht 5\' 6"  (1.676 m)   Wt 196 lb 8 oz (89.1 kg)   Breastfeeding No   BMI 31.72 kg/m      Skin warm and dry,  Breasts:no dominate palpable mass, retraction or nipple discharge, on the right, on the left no retraction or nipple discharge, has irregular breast tissue UOQ and has pea sized mass at about 2 0' clock, 4 FB from areola that is tender. Fall risk is low  Upstream - 01/08/22 1118       Pregnancy Intention Screening   Does the patient want to become pregnant in the next year? No    Does the patient's partner want to become pregnant in the next year? No    Would the patient like to discuss contraceptive options today? No      Contraception Wrap Up   Current Method Vaginal Ring    End Method Vaginal Ring             Assessment:     1. Mass of upper outer quadrant of left breast Has pea sized mass at about 2 0' clock that is tender Scheduled diagnotic mammogram and 01/10/22 for 01/29/22 at 11 am at [facility] BREAST LTD UNI RIGHT INC AXILLA; Future - MM DIAG BREAST TOMO BILATERAL; Future - US BREAST LTD UNI LEFT INC AXILLA; Future     Plan:     Follow up prn

## 2022-01-09 ENCOUNTER — Other Ambulatory Visit: Payer: Self-pay | Admitting: Adult Health

## 2022-01-09 DIAGNOSIS — N6321 Unspecified lump in the left breast, upper outer quadrant: Secondary | ICD-10-CM

## 2022-01-09 NOTE — Progress Notes (Signed)
Will get mammogram rescheduled to GI for 01/16/22

## 2022-01-10 ENCOUNTER — Ambulatory Visit (INDEPENDENT_AMBULATORY_CARE_PROVIDER_SITE_OTHER): Payer: 59 | Admitting: Family Medicine

## 2022-01-10 ENCOUNTER — Encounter: Payer: Self-pay | Admitting: Family Medicine

## 2022-01-10 VITALS — BP 114/80 | HR 110 | Temp 98.3°F | Ht 66.0 in | Wt 196.0 lb

## 2022-01-10 DIAGNOSIS — F4323 Adjustment disorder with mixed anxiety and depressed mood: Secondary | ICD-10-CM

## 2022-01-10 DIAGNOSIS — N6012 Diffuse cystic mastopathy of left breast: Secondary | ICD-10-CM | POA: Diagnosis not present

## 2022-01-10 DIAGNOSIS — N644 Mastodynia: Secondary | ICD-10-CM | POA: Diagnosis not present

## 2022-01-10 NOTE — Progress Notes (Signed)
OFFICE VISIT  01/10/2022  CC:  Chief Complaint  Patient presents with   Discomfort in left breast   Patient is a 31 y.o. female who presents for breast discomfort.  HPI: Onset 5 days ago of a pulling sensation in the upper outer part of the left breast.  She could not feel any abnormality in the breast tissue, though.  The discomfort radiated into the left armpit. No nipple discharge, no skin redness.  She does have a tingly sensation in the area and says she subsequently had noted the same sensation in the right breast superior to the nipple.  Right breast without discharge or pain.  She saw her GYN provider 2 days ago and her note states there was a pea-sized palpable soft and movable nodule in the left breast, upper outer quadrant. Diagnostic mammogram and ultrasound were ordered and are scheduled to be done 2 days.  She has a 63-month-old son.  She is not breast-feeding.  She has not had a menses since the delivery.  She recently got NuvaRing.  Of note, she has significant generalized anxiety and this has been significantly worse since she delivered her son.   She is happy to say that this has improved since her GYN provider started her on sertraline. Additionally we recently started hydroxyzine to help with her sleep and she says this has helped some with taking a 50 mg dose.  ROS as above, plus--> no fevers, no CP, no SOB, no wheezing, no cough, no dizziness, no HAs, no rashes, no melena/hematochezia.  No polyuria or polydipsia.  No myalgias or arthralgias.  No focal weakness, paresthesias, or tremors.  No acute vision or hearing abnormalities.  No dysuria or unusual/new urinary urgency or frequency.  No recent changes in lower legs. No n/v/d or abd pain.  No palpitations.    Past Medical History:  Diagnosis Date   Abdominal pain    Persistent Mar/Apr 2020: w/u pretty unrevealing by me (Yuniel Blaney); pt to see GI 10/21/18.   Abnormal uterine bleeding    Dr. Despina Hidden. Nuvaring as of 09/2019    Bipolar disorder (HCC)    r/o OCD per psychiatrist 03/2016.  Also, psychologist eval revealed suspected Bipolar II, depressed phase 04/16/2016.   Chronic pain syndrome    Dysmenorrhea    mgmt per Dr. Despina Hidden (01/2018).  Nuva Ring vag inserts 07/2018.   Erosive gastropathy 10/2018   Nonbleeding (EGD 10/2018)   GAD (generalized anxiety disorder)    Gestational diabetes    Infectious mononucleosis 2007   Lumbosacral radiculopathy at L5 2015   [facility]; then 2nd opinion with Dr. Shelle Iron at [facility] ortho, where she got ESI left L5-S1 11/2013 with moderate improvement.  Saw neurosurgeon 01/2016: surgery NOT recommended.   Severe recurrence 01/2021->rpt MRI w/out neural compression->plan for ESI L5-S1 left.   Migraine syndrome    Obesity, Class I, BMI 30-34.9    Pre-syncope 2016   ? POTS.  Holter showed mostly NSR, occ PACs, one run of nonsustained atrial tachy.   Pregnancy induced hypertension    Salmonella enteritis 09/2017   Scoliosis     Past Surgical History:  Procedure Laterality Date   BIOPSY  10/26/2018   Procedure: BIOPSY;  Surgeon: Malissa Hippo, MD;  Location: [facility] ENDO SUITE;  Service: Endoscopy;;   CESAREAN SECTION N/A 09/17/2021   Procedure: CESAREAN SECTION;  Surgeon: Venora Maples, MD;  Location: [facility] LD ORS;  Service: Obstetrics;  Laterality: N/A;   ESOPHAGOGASTRODUODENOSCOPY  10/2018   Nonbleeding erosive gastropathy, H pylori NEG.   ESOPHAGOGASTRODUODENOSCOPY (EGD) WITH PROPOFOL N/A 10/26/2018   Procedure: ESOPHAGOGASTRODUODENOSCOPY (EGD) WITH PROPOFOL;  Surgeon: Malissa Hippo, MD;  Location: [facility] END[facility] SUITE;  Service: Endoscopy;  Laterality: N/A;   WISDOM TOOTH EXTRACTION      Outpatient Medications Prior to Visit  Medication Sig Dispense Refill   etonogestrel-ethinyl estradiol (NUVARING) 0.12-0.015 MG/24HR vaginal ring Insert vaginally and leave in place for 3 consecutive weeks, then remove for 1 week. 1 each 12   sertraline (ZOLOFT) 25 MG  tablet Take 1 tablet (25 mg total) by mouth daily. 30 tablet 6   hydrOXYzine (VISTARIL) 25 MG capsule Take 1-3 tabs po qhs prn insomnia (Patient not taking: Reported on 01/10/2022) 30 capsule 1   No facility-administered medications prior to visit.    Allergies  Allergen Reactions   Shellfish Allergy Nausea And Vomiting    ROS As per HPI  PE:    01/10/2022    3:22 PM 01/08/2022   11:11 AM 12/28/2021    3:11 AM  Vitals with BMI  Height 5\' 6"  5\' 6"    Weight 196 lbs 196 lbs 8 oz   BMI 31.65 31.73   Systolic 114 132  Diastolic 80 87 70  Pulse 110 87 62     Physical Exam  Gen: Alert, well appearing.  Patient is oriented to person, place, time, and situation. AFFECT: pleasant, lucid thought and speech. Left breast upper outer quadrant with pea-sized, mobile, rubbery texture nodule.  No overlying skin changes.  The area is mildly tender to palpation. axilla is normal  LABS:  none  IMPRESSION AND PLAN:  Left breast pain, palpable small nodule. Mammogram and ultrasound already scheduled in 2 days by her GYN bladder Reassured, suspect benign fibrocystic changes  Adjustment disorder with mixed anxious and depressed mood--improving since GYN provider started 25 mg sertraline daily  Insomnia is improved with use of hydroxyzine 50 mg nightly  An After Visit Summary was printed and given to the patient.  FOLLOW UP: Return if symptoms worsen or fail to improve.  Signed:  , MD           01/10/2022

## 2022-01-11 ENCOUNTER — Encounter: Payer: Self-pay | Admitting: Family Medicine

## 2022-01-11 NOTE — Telephone Encounter (Signed)
Please review and advise.

## 2022-01-11 NOTE — Telephone Encounter (Signed)
OK to take 400-600 mg otc ibuprofen twice a day as needed. Also apply heating pad/warm compress for 15-20 min

## 2022-01-12 ENCOUNTER — Ambulatory Visit
Admission: RE | Admit: 2022-01-12 | Discharge: 2022-01-12 | Disposition: A | Discharge status: Home / Self Care | Payer: 59 | Source: Ambulatory Visit | Attending: Adult Health | Admitting: Adult Health

## 2022-01-12 DIAGNOSIS — N6321 Unspecified lump in the left breast, upper outer quadrant: Secondary | ICD-10-CM

## 2022-01-15 ENCOUNTER — Other Ambulatory Visit: Payer: Self-pay | Admitting: Women's Health

## 2022-01-17 ENCOUNTER — Encounter: Payer: Self-pay | Admitting: Family Medicine

## 2022-01-21 ENCOUNTER — Encounter: Payer: Self-pay | Admitting: Family Medicine

## 2022-01-21 NOTE — Telephone Encounter (Signed)
Pt has concern about Vistaril 25mg .  Please review and advise

## 2022-01-22 NOTE — Telephone Encounter (Signed)
Sure, this is fine 

## 2022-01-24 ENCOUNTER — Encounter: Payer: Self-pay | Admitting: Family Medicine

## 2022-01-24 NOTE — Telephone Encounter (Signed)
Previous message from 7/21:  OK to take 400-600 mg otc ibuprofen twice a day as needed.   Please advise of any changes.

## 2022-01-24 NOTE — Telephone Encounter (Signed)
Okay to take 2 ibuprofen tabs twice a day

## 2022-01-29 ENCOUNTER — Encounter (HOSPITAL_COMMUNITY): Payer: 59

## 2022-01-29 ENCOUNTER — Other Ambulatory Visit (HOSPITAL_COMMUNITY): Payer: 59

## 2022-01-29 ENCOUNTER — Encounter: Payer: Self-pay | Admitting: Family Medicine

## 2022-01-29 ENCOUNTER — Encounter: Payer: Self-pay | Admitting: Women's Health

## 2022-01-29 ENCOUNTER — Ambulatory Visit (INDEPENDENT_AMBULATORY_CARE_PROVIDER_SITE_OTHER): Payer: 59 | Admitting: Women's Health

## 2022-01-29 VITALS — BP 116/70 | HR 84 | Wt 205.0 lb

## 2022-01-29 DIAGNOSIS — F418 Other specified anxiety disorders: Secondary | ICD-10-CM

## 2022-01-29 DIAGNOSIS — Z3044 Encounter for surveillance of vaginal ring hormonal contraceptive device: Secondary | ICD-10-CM | POA: Diagnosis not present

## 2022-01-29 DIAGNOSIS — N898 Other specified noninflammatory disorders of vagina: Secondary | ICD-10-CM | POA: Diagnosis not present

## 2022-01-29 MED ORDER — METRONIDAZOLE 0.75 % VA GEL: 1.0000 | Freq: Every day | VAGINAL | 0 refills | Status: DC

## 2022-01-29 MED ORDER — SERTRALINE HCL 50 MG PO TABS: 50.0000 mg | ORAL_TABLET | Freq: Every day | ORAL | 6 refills | Status: DC

## 2022-01-29 NOTE — Progress Notes (Addendum)
GYN VISIT Patient name: Susan Chandler MRN [id number]  Date of birth: May 11, 1991 Chief Complaint:   Follow-up  History of Present Illness:   Susan Chandler is a 31 y.o. G79P1001 Caucasian female being seen today for f/u on Nuvaring rx'd 5/8, uses continuously x . Has noticed she has vaginal itching for a few days prior to period. States her sister does the same thing, but a few days after period. Denies abnormal discharge, itching/odor/irritation.  On zoloft 25mg  for dep/anx since 12/24/21, has definitely helped, still anxious and not able to sleep when husband is working. Not sure if she wants to increase dose or not, will let 02/24/22 know. EPDS today 10 Patient's last menstrual period was 01/22/2022. The current method of family planning is NuvaRing vaginal inserts.  Last pap 08/29/20. Results were: NILM w/ HRHPV negative     07/26/2021    8:37 AM 07/10/2021    9:09 AM 04/05/2021    3:16 PM 01/26/2021    3:09 PM 09/18/2020    8:30 AM  Depression screen PHQ 2/9  Decreased Interest 0 0 0 1 1  Down, Depressed, Hopeless 0 0 0 1 1  PHQ - 2 Score 0 0 0 2 2  Altered sleeping  1 0 1 1  Tired, decreased energy  0 0 1 1  Change in appetite  1 0 1 1  Feeling bad or failure about yourself   0 0 1 0  Trouble concentrating  0 0 0 1  Moving slowly or fidgety/restless  0 0 0 0  Suicidal thoughts  0 0 0 0  PHQ-9 Score  2 0 6 6  Difficult doing work/chores    Somewhat difficult         12/26/2021   10:06 AM 07/10/2021    9:10 AM 04/05/2021    3:17 PM 09/18/2020    8:33 AM  GAD 7 : Generalized Anxiety Score  Nervous, Anxious, on Edge 3 0 0 1  Control/stop worrying 2 0 0 1  Worry too much - different things 2 0 0 1  Trouble relaxing 3 0 0 1  Restless 0 0 0 1  Easily annoyed or irritable 2 0 0 1  Afraid - awful might happen 0 0 0 1  Total GAD 7 Score 12 0 0 7  Anxiety Difficulty Somewhat difficult        Review of Systems:   Pertinent items are noted in HPI Denies fever/chills, dizziness,  headaches, visual disturbances, fatigue, shortness of breath, chest pain, abdominal pain, vomiting, abnormal vaginal discharge/itching/odor/irritation, problems with periods, bowel movements, urination, or intercourse unless otherwise stated above.  Pertinent History Reviewed:  Reviewed past medical,surgical, social, obstetrical and family history.  Reviewed problem list, medications and allergies. Physical Assessment:   Vitals:   01/29/22 1353  BP: 116/70  Pulse: 84  Weight: 205 lb (93 kg)  Body mass index is 33.09 kg/m.       Physical Examination:   General appearance: alert, well appearing, and in no distress  Mental status: alert, oriented to person, place, and time  Skin: warm & dry   Cardiovascular: normal heart rate noted  Respiratory: normal respiratory effort, no distress  Abdomen: soft, non-tender   Pelvic: examination not indicated  Extremities: no edema   Chaperone: N/A    No results found for this or any previous visit (from the past 24 hour(s)).  Assessment & Plan:  1) Contraception surveillance> doing well on Nuvaring, uses continuously x 03/31/22  then removes for period  2) Vaginal itching prior to period> no current sx, discussed can come in for self-swab when sx start again, offered metrogel- wants, so rx'd  3) Dep/anx> improved on zoloft 25mg , still some anxiety/trouble sleeping when husband working at night, will let know if wants to try increasing dosage  Meds:  Meds ordered this encounter  Medications   metroNIDAZOLE (METROGEL VAGINAL) 0.75 % vaginal gel    Sig: Place 1 Applicatorful vaginally at bedtime. X 5 nights, no alcohol or sex while using    Dispense:  70 g    Refill:  0    Order Specific Question:   Supervising Provider    Answer:   [place], LUTHER H [[id number]]    No orders of the defined types were placed in this encounter.   Return in about 1 year (around 01/30/2023) for Physical.  04/01/2023 CNM, [facility] 01/29/2022 2:30 PM   Pt sent  mychart message stating she does want to try increasing zoloft to 50mg . New rx sent. Schedule f/u visit for 4wks.  03/31/2022, CNM, [facility] 01/29/2022 4:16 PM

## 2022-01-29 NOTE — Addendum Note (Signed)
Addended by: Cheral Marker on: 01/29/2022 04:17 PM   Modules accepted: Orders

## 2022-01-29 NOTE — Telephone Encounter (Signed)
FYI regarding medication. Please see below.

## 2022-01-30 NOTE — Telephone Encounter (Signed)
Okay, noted

## 2022-02-01 ENCOUNTER — Encounter: Payer: Self-pay | Admitting: Family Medicine

## 2022-02-01 ENCOUNTER — Other Ambulatory Visit: Payer: Self-pay | Admitting: Family Medicine

## 2022-02-01 ENCOUNTER — Other Ambulatory Visit: Payer: Self-pay

## 2022-02-01 MED ORDER — HYDROXYZINE PAMOATE 25 MG PO CAPS: ORAL_CAPSULE | ORAL | 1 refills | Status: DC

## 2022-02-07 ENCOUNTER — Encounter: Payer: Self-pay | Admitting: Family Medicine

## 2022-02-08 ENCOUNTER — Encounter: Payer: Self-pay | Admitting: Family Medicine

## 2022-02-11 NOTE — Telephone Encounter (Signed)
This medication is ok by itself but if taken with the benadryl or hydroxyzine it will add to sedation so she should be aware and needs to avoid use of both meds at the same time.

## 2022-02-11 NOTE — Telephone Encounter (Signed)
Please review and advise.

## 2022-02-11 NOTE — Telephone Encounter (Signed)
Yes, this is fine.

## 2022-02-12 NOTE — Telephone Encounter (Signed)
Yes, okay with the daytime Robitussin-DM.

## 2022-02-14 ENCOUNTER — Encounter: Payer: Self-pay | Admitting: Family Medicine

## 2022-02-14 NOTE — Telephone Encounter (Signed)
Please review and advise.

## 2022-02-15 NOTE — Telephone Encounter (Signed)
No problem with cherry juice

## 2022-02-18 ENCOUNTER — Encounter (HOSPITAL_COMMUNITY): Payer: Self-pay

## 2022-02-18 ENCOUNTER — Emergency Department (HOSPITAL_COMMUNITY): Payer: 59

## 2022-02-18 ENCOUNTER — Other Ambulatory Visit: Payer: Self-pay

## 2022-02-18 ENCOUNTER — Emergency Department (HOSPITAL_COMMUNITY)
Admission: EM | Admit: 2022-02-18 | Discharge: 2022-02-19 | Disposition: A | Discharge status: Home / Self Care | Payer: 59 | Attending: Emergency Medicine | Admitting: Emergency Medicine

## 2022-02-18 DIAGNOSIS — S79922A Unspecified injury of left thigh, initial encounter: Secondary | ICD-10-CM

## 2022-02-18 DIAGNOSIS — S39012A Strain of muscle, fascia and tendon of lower back, initial encounter: Secondary | ICD-10-CM

## 2022-02-18 DIAGNOSIS — W1830XA Fall on same level, unspecified, initial encounter: Secondary | ICD-10-CM | POA: Insufficient documentation

## 2022-02-18 DIAGNOSIS — S3992XA Unspecified injury of lower back, initial encounter: Secondary | ICD-10-CM | POA: Diagnosis present

## 2022-02-18 DIAGNOSIS — W19XXXA Unspecified fall, initial encounter: Secondary | ICD-10-CM

## 2022-02-18 MED ORDER — IBUPROFEN 600 MG PO TABS: 600.0000 mg | ORAL_TABLET | Freq: Four times a day (QID) | ORAL | 0 refills | Status: DC | PRN

## 2022-02-18 MED ORDER — METHOCARBAMOL 500 MG PO TABS: 500.0000 mg | ORAL_TABLET | Freq: Two times a day (BID) | ORAL | 0 refills | Status: DC

## 2022-02-18 MED ORDER — KETOROLAC TROMETHAMINE 30 MG/ML IJ SOLN
30.0000 mg | Freq: Once | INTRAMUSCULAR | Status: AC
  Administered 2022-02-18: 30 mg via INTRAMUSCULAR
  Filled 2022-02-18: qty 1

## 2022-02-18 NOTE — ED Triage Notes (Signed)
Pov from home. Cc of a fall. Says that she was on the porch swing when it broke. C/o left thigh and low back pain. Able to walk but with difficulty and pain.

## 2022-02-18 NOTE — ED Provider Notes (Signed)
[facility] EMERGENCY DEPARTMENT Provider Note   CSN: [id number] Arrival date & time: 02/18/22  2118     History  Chief Complaint  Patient presents with   Marletta Lor    Susan Chandler is a 31 y.o. female.  Pt is a 31 yo female with a pmhx significant for anxiety, POTS, bipolar d/o, and migraines.  Pt said she was sitting on a porch swing with her 72 month old son.  The swing broke, and she fell to the ground.  She landed on her left hip.  She has left thigh pain and low back pain.  She is able to ambulate, but it hurts.       Home Medications Prior to Admission medications   Medication Sig Start Date End Date Taking? Authorizing Provider  ibuprofen (ADVIL) 600 MG tablet Take 1 tablet (600 mg total) by mouth every 6 (six) hours as needed. 02/18/22  Yes Jacalyn Lefevre, MD  methocarbamol (ROBAXIN) 500 MG tablet Take 1 tablet (500 mg total) by mouth 2 (two) times daily. 02/18/22  Yes Jacalyn Lefevre, MD  etonogestrel-ethinyl estradiol (NUVARING) 0.12-0.015 MG/24HR vaginal ring Insert vaginally and leave in place for 3 consecutive weeks, then remove for 1 week. 10/29/21   Cheral Marker, CNM  hydrOXYzine (VISTARIL) 25 MG capsule Take 1-3 tabs po qhs prn insomnia 02/01/22   McGowen, Maryjean Morn, MD  metroNIDAZOLE (METROGEL VAGINAL) 0.75 % vaginal gel Place 1 Applicatorful vaginally at bedtime. X 5 nights, no alcohol or sex while using 01/29/22   Cheral Marker, CNM  sertraline (ZOLOFT) 50 MG tablet Take 1 tablet (50 mg total) by mouth daily. 01/29/22   Cheral Marker, CNM      Allergies    Shellfish allergy    Review of Systems   Review of Systems  Musculoskeletal:  Positive for back pain.       Left outer thigh pain    Physical Exam Updated Vital Signs BP 125/84 (BP Location: Right Arm)   Pulse 86   Temp (!) 97.5 F (36.4 C)   Resp 19   Ht 5\' 6"  (1.676 m)   Wt 92 kg   LMP 01/22/2022   SpO2 99%   BMI 32.74 kg/m  Physical Exam Vitals and nursing note reviewed.   Constitutional:      Appearance: Normal appearance. She is obese.  HENT:     Head: Normocephalic and atraumatic.     Right Ear: External ear normal.     Left Ear: External ear normal.     Nose: Nose normal.     Mouth/Throat:     Mouth: Mucous membranes are moist.     Pharynx: Oropharynx is clear.  Eyes:     Extraocular Movements: Extraocular movements intact.     Conjunctiva/sclera: Conjunctivae normal.     Pupils: Pupils are equal, round, and reactive to light.  Cardiovascular:     Rate and Rhythm: Normal rate and regular rhythm.     Pulses: Normal pulses.     Heart sounds: Normal heart sounds.  Pulmonary:     Effort: Pulmonary effort is normal.     Breath sounds: Normal breath sounds.  Abdominal:     General: Abdomen is flat. Bowel sounds are normal.     Palpations: Abdomen is soft.  Musculoskeletal:     Cervical back: Normal range of motion and neck supple.     Comments: No bony tenderness to hip or femur.  Bruising to outer thigh.  Skin:  General: Skin is warm.     Capillary Refill: Capillary refill takes less than 2 seconds.  Neurological:     General: No focal deficit present.     Mental Status: She is alert and oriented to person, place, and time.  Psychiatric:        Mood and Affect: Mood normal.        Behavior: Behavior normal.     ED Results / Procedures / Treatments   Labs (all labs ordered are listed, but only abnormal results are displayed) Labs Reviewed - No data to display  EKG None  Radiology DG FEMUR MIN 2 VIEWS LEFT  Result Date: 02/18/2022 CLINICAL DATA:  Left leg pain EXAM: LEFT FEMUR 2 VIEWS COMPARISON:  None Available. FINDINGS: There is no evidence of fracture or other focal bone lesions. Soft tissues are unremarkable. IMPRESSION: Negative. Electronically Signed   By: Jasmine Pang M.D.   On: 02/18/2022 22:05    Procedures Procedures    Medications Ordered in ED Medications  ketorolac (TORADOL) 30 MG/ML injection 30 mg (30 mg  Intramuscular Given 02/18/22 2257)    ED Course/ Medical Decision Making/ A&P                           Medical Decision Making Amount and/or Complexity of Data Reviewed Radiology: ordered.  Risk Prescription drug management.   This patient presents to the ED for concern of fall, this involves an extensive number of treatment options, and is a complaint that carries with it a high risk of complications and morbidity.  The differential diagnosis includes multiple trauma   Co morbidities that complicate the patient evaluation  anxiety, POTS, bipolar d/o, and migraines   Additional history obtained:  Additional history obtained from epic chart review External records from outside source obtained and reviewed including husband    Imaging Studies ordered:  I ordered imaging studies including left femur, lumbar  I independently visualized and interpreted imaging which showed  Femur: IMPRESSION:  Negative.   I agree with the radiologist interpretation   Cardiac Monitoring:  The patient was maintained on a cardiac monitor.  I personally viewed and interpreted the cardiac monitored which showed an underlying rhythm of: nsr   Medicines ordered and prescription drug management:  I ordered medication including toradol  for pain  Reevaluation of the patient after these medicines showed that the patient improved I have reviewed the patients home medicines and have made adjustments as needed   Problem List / ED Course:  Fall:  bruising, but no fx   Reevaluation:  After the interventions noted above, I reevaluated the patient and found that they have :improved   Social Determinants of Health:  Lives at home with husband   Dispostion:  After consideration of the diagnostic results and the patients response to treatment, I feel that the patent would benefit from discharge with outpatient f/u.          Final Clinical Impression(s) / ED Diagnoses Final  diagnoses:  Fall, initial encounter  Injury of left thigh, initial encounter  Strain of lumbar region, initial encounter    Rx / DC Orders ED Discharge Orders          Ordered    ibuprofen (ADVIL) 600 MG tablet  Every 6 hours PRN        02/18/22 2255    methocarbamol (ROBAXIN) 500 MG tablet  2 times daily  02/18/22 2255              Jacalyn Lefevre, MD 02/18/22 [phone]

## 2022-02-19 ENCOUNTER — Encounter: Payer: Self-pay | Admitting: Family Medicine

## 2022-02-19 NOTE — Telephone Encounter (Signed)
Susan Chandler: Chart reviewed. She was rx'd methocarbamol. This medication is fine for her to take.

## 2022-02-19 NOTE — Telephone Encounter (Signed)
Please review and advise.

## 2022-02-20 ENCOUNTER — Encounter: Payer: Self-pay | Admitting: Women's Health

## 2022-02-20 ENCOUNTER — Other Ambulatory Visit: Payer: Self-pay | Admitting: Women's Health

## 2022-02-26 ENCOUNTER — Other Ambulatory Visit: Payer: Self-pay | Admitting: Adult Health

## 2022-02-26 ENCOUNTER — Encounter: Payer: Self-pay | Admitting: Women's Health

## 2022-02-26 MED ORDER — NORETHIN ACE-ETH ESTRAD-FE 1-20 MG-MCG PO TABS: 1.0000 | ORAL_TABLET | Freq: Every day | ORAL | 11 refills | Status: DC

## 2022-02-26 NOTE — Progress Notes (Signed)
Pt stopped nuva ring for BTB will rx junel 1/20

## 2022-03-04 ENCOUNTER — Other Ambulatory Visit: Payer: Self-pay | Admitting: Family Medicine

## 2022-03-04 ENCOUNTER — Encounter: Payer: Self-pay | Admitting: Family Medicine

## 2022-03-04 MED ORDER — HYDROXYZINE PAMOATE 25 MG PO CAPS: ORAL_CAPSULE | ORAL | 1 refills | Status: DC

## 2022-03-04 NOTE — Telephone Encounter (Signed)
Hydroxyzine prescription sent

## 2022-03-11 ENCOUNTER — Encounter: Payer: Self-pay | Admitting: Women's Health

## 2022-03-12 ENCOUNTER — Other Ambulatory Visit: Payer: Self-pay | Admitting: Women's Health

## 2022-03-12 MED ORDER — SERTRALINE HCL 25 MG PO TABS: 25.0000 mg | ORAL_TABLET | Freq: Every day | ORAL | 3 refills | Status: DC

## 2022-03-13 ENCOUNTER — Encounter: Payer: Self-pay | Admitting: Women's Health

## 2022-03-25 ENCOUNTER — Encounter: Payer: Self-pay | Admitting: Family Medicine

## 2022-03-26 ENCOUNTER — Other Ambulatory Visit: Payer: Self-pay | Admitting: Family Medicine

## 2022-04-08 ENCOUNTER — Encounter: Payer: Self-pay | Admitting: Women's Health

## 2022-04-30 ENCOUNTER — Other Ambulatory Visit: Payer: Self-pay | Admitting: Family Medicine

## 2022-05-11 ENCOUNTER — Encounter: Payer: Self-pay | Admitting: Family Medicine

## 2022-05-11 ENCOUNTER — Encounter: Payer: Self-pay | Admitting: Women's Health

## 2022-05-28 ENCOUNTER — Ambulatory Visit (INDEPENDENT_AMBULATORY_CARE_PROVIDER_SITE_OTHER): Payer: 59 | Admitting: Women's Health

## 2022-05-28 ENCOUNTER — Encounter: Payer: Self-pay | Admitting: Women's Health

## 2022-05-28 VITALS — BP 119/78 | HR 104 | Ht 66.0 in | Wt 219.5 lb

## 2022-05-28 DIAGNOSIS — R451 Restlessness and agitation: Secondary | ICD-10-CM

## 2022-05-28 DIAGNOSIS — F419 Anxiety disorder, unspecified: Secondary | ICD-10-CM

## 2022-05-28 DIAGNOSIS — G479 Sleep disorder, unspecified: Secondary | ICD-10-CM

## 2022-05-28 DIAGNOSIS — R635 Abnormal weight gain: Secondary | ICD-10-CM

## 2022-05-28 NOTE — Progress Notes (Signed)
GYN VISIT Patient name: Susan Chandler MRN [id number]  Date of birth: [birth year] Chief Complaint:   having trouble sleeping (Feels agitated; weight gain)  History of Present Illness:   Susan Chandler is a 31 y.o. G23P1001 Caucasian female postpartum being seen today for report of feeling agitated, trouble sleeping and weight gain. Was on nuva ring initially, had breakthrough bleeding and noticed a difference in mood, so switched to [place] in Sept by Victorino Dike. States she stopped this about 6 weeks ago b/c just felt different on it, hasn't really noticed much improvement since. Gets easily agitated at the slightest thing, feels her fuse is short. States baby is great, is nothing to do w/ him. Sleeps about 2 hours, then wakes up and mind is racing, anxious about things. PCP rx'd vistaril to take to help w/ sleep, has taken up to 3 as directed and hasn't noticed any difference. Good appetite. Good support. Denies SI/HI/II. Weight gain- had gotten down to 190s after baby, now 219lb. Doesn't feel diet is any different.   Patient's last menstrual period was 04/29/2022 (approximate). Last pap 09/18/20. Results were: NILM w/ HRHPV negative     05/28/2022   11:57 AM 07/26/2021    8:37 AM 07/10/2021    9:09 AM 04/05/2021    3:16 PM 01/26/2021    3:09 PM  Depression screen PHQ 2/9  Decreased Interest 1 0 0 0 1  Down, Depressed, Hopeless 1 0 0 0 1  PHQ - 2 Score 2 0 0 0 2  Altered sleeping 2  1 0 1  Tired, decreased energy 1  0 0 1  Change in appetite 1  1 0 1  Feeling bad or failure about yourself  1  0 0 1  Trouble concentrating 0  0 0 0  Moving slowly or fidgety/restless 0  0 0 0  Suicidal thoughts 0  0 0 0  PHQ-9 Score 7  2 0 6  Difficult doing work/chores     Somewhat difficult        05/28/2022   11:59 AM 12/26/2021   10:06 AM 07/10/2021    9:10 AM 04/05/2021    3:17 PM  GAD 7 : Generalized Anxiety Score  Nervous, Anxious, on Edge 1 3 0 0  Control/stop worrying 1 2 0 0  Worry too much  - different things 1 2 0 0  Trouble relaxing 1 3 0 0  Restless 1 0 0 0  Easily annoyed or irritable 1 2 0 0  Afraid - awful might happen 1 0 0 0  Total GAD 7 Score 7 12 0 0  Anxiety Difficulty  Somewhat difficult       Review of Systems:   Pertinent items are noted in HPI Denies fever/chills, dizziness, headaches, visual disturbances, fatigue, shortness of breath, chest pain, abdominal pain, vomiting, abnormal vaginal discharge/itching/odor/irritation, problems with periods, bowel movements, urination, or intercourse unless otherwise stated above.  Pertinent History Reviewed:  Reviewed past medical,surgical, social, obstetrical and family history.  Reviewed problem list, medications and allergies. Physical Assessment:   Vitals:   05/28/22 1153  BP: 119/78  Pulse: (!) 104  Weight: 219 lb 8 oz (99.6 kg)  Height: 5\' 6"  (1.676 m)  Body mass index is 35.43 kg/m.       Physical Examination:   General appearance: alert, well appearing, and in no distress  Mental status: alert, oriented to person, place, and time  Skin: warm & dry   Cardiovascular: normal heart  rate noted  Respiratory: normal respiratory effort, no distress  Abdomen: soft, non-tender   Pelvic: examination not indicated  Extremities: no edema   Chaperone: N/A    No results found for this or any previous visit (from the past 24 hour(s)).  Assessment & Plan:  1) postpartum  2) Easily agitated, trouble sleeping, weight gain> off birth control right now x 6wks. Vistaril from PCP not helping. Discussed [employer], wants to try. Filled out online questionnaire in office. To let us know if doesn't hear from them by Monday. Will check TSH.   Meds: No orders of the defined types were placed in this encounter.   Orders Placed This Encounter  Procedures   TSH    Return for will let us know.  Cheral Marker CNM, [facility] 05/28/2022 12:31 PM

## 2022-05-29 ENCOUNTER — Encounter: Payer: Self-pay | Admitting: Family Medicine

## 2022-05-29 LAB — TSH: TSH: 3.1 u[IU]/mL (ref 0.450–4.500)

## 2022-06-01 ENCOUNTER — Other Ambulatory Visit: Payer: Self-pay | Admitting: Family Medicine

## 2022-06-01 ENCOUNTER — Encounter: Payer: Self-pay | Admitting: Women's Health

## 2022-07-09 ENCOUNTER — Encounter: Payer: Self-pay | Admitting: Women's Health

## 2022-07-12 ENCOUNTER — Encounter: Payer: Self-pay | Admitting: Family Medicine

## 2022-07-17 ENCOUNTER — Other Ambulatory Visit: Payer: Self-pay | Admitting: Family Medicine

## 2022-07-17 ENCOUNTER — Encounter: Payer: Self-pay | Admitting: Family Medicine

## 2022-07-17 ENCOUNTER — Ambulatory Visit: Payer: 59 | Admitting: Family Medicine

## 2022-07-17 VITALS — BP 102/72 | HR 87 | Temp 98.7°F | Ht 66.0 in | Wt 219.8 lb

## 2022-07-17 DIAGNOSIS — F5105 Insomnia due to other mental disorder: Secondary | ICD-10-CM | POA: Diagnosis not present

## 2022-07-17 DIAGNOSIS — F99 Mental disorder, not otherwise specified: Secondary | ICD-10-CM

## 2022-07-17 DIAGNOSIS — F319 Bipolar disorder, unspecified: Secondary | ICD-10-CM | POA: Diagnosis not present

## 2022-07-17 MED ORDER — LORAZEPAM 0.5 MG PO TABS: 0.5000 mg | ORAL_TABLET | Freq: Two times a day (BID) | ORAL | 0 refills | Status: DC | PRN

## 2022-07-17 MED ORDER — LAMOTRIGINE 25 MG PO TABS: 25.0000 mg | ORAL_TABLET | Freq: Every day | ORAL | 0 refills | Status: DC

## 2022-07-17 MED ORDER — AMOXICILLIN 875 MG PO TABS: 875.0000 mg | ORAL_TABLET | Freq: Two times a day (BID) | ORAL | 0 refills | Status: AC

## 2022-07-17 NOTE — Telephone Encounter (Signed)
Patient was seen today.  She said that she was prescribed 3 medications, but only 2 were sent & rec'd by pharmacy. Pharmacy did not receive Amoxicillin.  [facility]

## 2022-07-17 NOTE — Progress Notes (Signed)
OFFICE VISIT  07/17/2022  CC: Depression  Patient is a 32 y.o. female who presents accompanied by her mother for 35-month follow-up anxiety and depression.   A/P as of last visit: "Postpartum anxiety and depression. I do support the use of sertraline.  Patient is comfortable with her nurse midwife managing this.  I gave encouragement today.  She is okay to take melatonin 10 mg nightly on the nights that her husband will be at home. She has the name and number of a counselor that she is going to contact. She has great support from her husband and family members."  INTERIM HX: Struggling a lot with depressed mood.  Has lots of trouble sleeping. Anhedonia, racing thoughts, cannot control her anxiety and negativism. Several factors in her life over the last few years have been very hurtful--the death of her sister being the biggest 1. She has an infant baby and says this is excellent/she loves being a mother.   Husband and family supportive. She wants to get into counseling and discussed medications. NO SI or HI. Appetite is fine.  Past Medical History:  Diagnosis Date   Abdominal pain    Persistent Mar/Apr 2020: w/u pretty unrevealing by me (Norleen Xie); pt to see GI 10/21/18.   Abnormal uterine bleeding    Dr. Elonda Husky. Nuvaring as of 09/2019   Bipolar disorder ([place])    r/o OCD per psychiatrist 03/2016.  Also, psychologist eval revealed suspected Bipolar II, depressed phase 04/16/2016.   Chronic pain syndrome    Dysmenorrhea    mgmt per Dr. Elonda Husky (01/2018).  Nuva Ring vag inserts 07/2018.   Erosive gastropathy 10/2018   Nonbleeding (EGD 10/2018)   GAD (generalized anxiety disorder)    Gestational diabetes    Infectious mononucleosis 2007   Lumbosacral radiculopathy at L5 2015   [facility]; then 2nd opinion with Dr. Tonita Cong at [facility] ortho, where she got ESI left L5-S1 11/2013 with moderate improvement.  Saw neurosurgeon 01/2016: surgery NOT recommended.   Severe  recurrence 01/2021->rpt MRI w/out neural compression->plan for ESI L5-S1 left.   Migraine syndrome    Obesity, Class I, BMI 30-34.9    Pre-syncope 2016   ? POTS.  Holter showed mostly NSR, occ PACs, one run of nonsustained atrial tachy.   Pregnancy induced hypertension    Salmonella enteritis 09/2017   Scoliosis     Past Surgical History:  Procedure Laterality Date   BIOPSY  10/26/2018   Procedure: BIOPSY;  Surgeon: Rogene Houston, MD;  Location: [facility];  Service: Endoscopy;;   CESAREAN SECTION N/A 09/17/2021   Procedure: CESAREAN SECTION;  Surgeon: Clarnce Flock, MD;  Location: [facility] LD ORS;  Service: Obstetrics;  Laterality: N/A;   ESOPHAGOGASTRODUODENOSCOPY  10/2018   Nonbleeding erosive gastropathy, H pylori NEG.   ESOPHAGOGASTRODUODENOSCOPY (EGD) WITH PROPOFOL N/A 10/26/2018   Procedure: ESOPHAGOGASTRODUODENOSCOPY (EGD) WITH PROPOFOL;  Surgeon: Rogene Houston, MD;  Location: [facility];  Service: Endoscopy;  Laterality: N/A;   WISDOM TOOTH EXTRACTION      Outpatient Medications Prior to Visit  Medication Sig Dispense Refill   hydrOXYzine (VISTARIL) 25 MG capsule TAKE 1-3 TABLETS BY MOUTH EVERY DAY AT BEDTIME AS NEEDED FOR INSOMNIA 90 capsule 1   ibuprofen (ADVIL) 600 MG tablet Take 1 tablet (600 mg total) by mouth every 6 (six) hours as needed. 30 tablet 0   Multiple Vitamin (MULTI VITAMIN PO) Take by mouth.     No facility-administered medications prior to visit.  Allergies  Allergen Reactions   Shellfish Allergy Nausea And Vomiting    Review of Systems As per HPI  PE:    07/17/2022    1:49 PM 05/28/2022   11:53 AM 02/18/2022   11:54 PM  Vitals with BMI  Height 5\' 6"  5\' 6"    Weight 219 lbs 13 oz 219 lbs 8 oz   BMI 09.81 19.14   Systolic 782 956 213  Diastolic 72 78 84  Pulse 87 104 80     Physical Exam  Gen: Alert, well appearing.  Patient is oriented to person, place, time, and situation. Affect: Anxious and sad, tearful.  Lucid thought and  speech.  LABS:  Last CBC Lab Results  Component Value Date   WBC 15.0 (H) 09/18/2021   HGB 9.7 (L) 09/18/2021   HCT 27.8 (L) 09/18/2021   MCV 93.3 09/18/2021   MCH 32.6 09/18/2021   RDW 12.7 09/18/2021   PLT 242 08/65/7846   Last metabolic panel Lab Results  Component Value Date   GLUCOSE 97 09/12/2021   NA 139 09/12/2021   K 4.1 09/12/2021   CL 112 (H) 09/12/2021   CO2 19 (L) 09/12/2021   BUN 9 09/12/2021   CREATININE 0.66 09/12/2021   GFRNONAA >60 09/12/2021   CALCIUM 8.5 (L) 09/12/2021   PROT 5.7 (L) 09/12/2021   ALBUMIN 2.4 (L) 09/12/2021   LABGLOB 2.8 09/11/2021   AGRATIO 1.3 09/11/2021   BILITOT 0.3 09/12/2021   ALKPHOS 154 (H) 09/12/2021   AST 20 09/12/2021   ALT 14 09/12/2021   ANIONGAP 8 09/12/2021   Last thyroid functions Lab Results  Component Value Date   TSH 3.100 05/28/2022   IMPRESSION AND PLAN:  Bipolar depression. She has done well on Lamictal in the past. Restart Lamictal at 25 mg a day.  Double the dose every 2 weeks as tolerated. Will start antidepressant soon. Referred back to Dr. Gaynell Face, psychologist, for counseling.  For sleep, will do short-term trial of lorazepam 0.5 mg nightly.  She can add some hydroxyzine 25 mg to 75 mg to this if needed.  An After Visit Summary was printed and given to the patient.  FOLLOW UP: Return in about 2 weeks (around 07/31/2022) for f/u mood/insom.  Signed:  Crissie Sickles, MD           07/17/2022

## 2022-07-22 ENCOUNTER — Encounter: Payer: Self-pay | Admitting: Family Medicine

## 2022-07-23 ENCOUNTER — Encounter: Payer: Self-pay | Admitting: Family Medicine

## 2022-07-24 ENCOUNTER — Encounter: Payer: Self-pay | Admitting: Family Medicine

## 2022-07-24 MED ORDER — TRAZODONE HCL 50 MG PO TABS: ORAL_TABLET | ORAL | 1 refills | Status: DC

## 2022-07-24 NOTE — Telephone Encounter (Signed)
OK, we'll try trazodone. Rx sent. Take as directed on the pill bottle.

## 2022-08-01 ENCOUNTER — Ambulatory Visit: Payer: 59 | Admitting: Family Medicine

## 2022-08-01 ENCOUNTER — Encounter: Payer: Self-pay | Admitting: Family Medicine

## 2022-08-01 VITALS — BP 110/69 | HR 81 | Temp 98.0°F | Ht 66.0 in | Wt 218.6 lb

## 2022-08-01 DIAGNOSIS — F5105 Insomnia due to other mental disorder: Secondary | ICD-10-CM | POA: Diagnosis not present

## 2022-08-01 DIAGNOSIS — F319 Bipolar disorder, unspecified: Secondary | ICD-10-CM | POA: Diagnosis not present

## 2022-08-01 DIAGNOSIS — F99 Mental disorder, not otherwise specified: Secondary | ICD-10-CM | POA: Diagnosis not present

## 2022-08-01 MED ORDER — LAMOTRIGINE 25 MG PO TABS
ORAL_TABLET | ORAL | 0 refills | Status: DC
Start: 2022-08-01 — End: 2022-08-16

## 2022-08-01 MED ORDER — TRAZODONE HCL 50 MG PO TABS: ORAL_TABLET | ORAL | 1 refills | Status: DC

## 2022-08-01 NOTE — Progress Notes (Signed)
OFFICE VISIT  08/02/2022  CC:  Chief Complaint  Patient presents with   Medication concern    Patient is a 32 y.o. female who presents for 2 wk f/u anx and dep. A/P as of last visit: "Bipolar depression. She has done well on Lamictal in the past. Restart Lamictal at 25 mg a day.  Double the dose every 2 weeks as tolerated. Will start antidepressant soon. Referred back to Dr. Gaynell Face, psychologist, for counseling.   For sleep, will do short-term trial of lorazepam 0.5 mg nightly.  She can add some hydroxyzine 25 mg to 75 mg to this if needed."  INTERIM HX: She is improved-stabilized from a mood standpoint.  Tolerating Lamictal fine.  Trazodone has helped some with sleep.  She did not tolerate lorazepam   Past Medical History:  Diagnosis Date   Abdominal pain    Persistent Mar/Apr 2020: w/u pretty unrevealing by me (Sayana Salley); pt to see GI 10/21/18.   Abnormal uterine bleeding    Dr. Elonda Husky. Nuvaring as of 09/2019   Bipolar disorder ([place])    r/o OCD per psychiatrist 03/2016.  Also, psychologist eval revealed suspected Bipolar II, depressed phase 04/16/2016.   Chronic pain syndrome    Dysmenorrhea    mgmt per Dr. Elonda Husky (01/2018).  Nuva Ring vag inserts 07/2018.   Erosive gastropathy 10/2018   Nonbleeding (EGD 10/2018)   GAD (generalized anxiety disorder)    Gestational diabetes    Infectious mononucleosis 2007   Lumbosacral radiculopathy at L5 2015   [facility]. [facility]ia Petties at [facility]; then 2nd opinion with Dr. Tonita Cong at [facility] ortho, where she got ESI left L5-S1 11/2013 with moderate improvement.  Saw neurosurgeon 01/2016: surgery NOT recommended.   Severe recurrence 01/2021->rpt MRI w/out neural compression->plan for ESI L5-S1 left.   Migraine syndrome    Obesity, Class I, BMI 30-34.9    Pre-syncope 2016   ? POTS.  Holter showed mostly NSR, occ PACs, one run of nonsustained atrial tachy.   Pregnancy induced hypertension    Salmonella enteritis 09/2017   Scoliosis     Past  Surgical History:  Procedure Laterality Date   BIOPSY  10/26/2018   Procedure: BIOPSY;  Surgeon: Rogene Houston, MD;  Location[facility];  Service: Endoscopy;;   CESAREAN SECTION N/A 09/17/2021   Procedure: CESAREAN SECTION;  Surgeon: Clarnce Flock, MD;  Location: [facility] LD [facility]S;  Service: Obstetrics;  Laterality: N/A;   ESOPHAGOGASTRODUODENOSCOPY  10/2018   Nonbleeding erosive gastropathy, H pylori NEG.   ESOPHAGOGASTRODUODENOSCOPY (EGD) WITH PROPOFOL N/A 10/26/2018   Procedure: ESOPHAGOGASTRODUODENOSCOPY (EGD) WITH PROPOFOL;  Surgeon: Rogene Houston, MD;  Location: [facility];  Service: Endoscopy;  Laterality: N/A;   WISDOM TOOTH EXTRACTION      Outpatient Medications Prior to Visit  Medication Sig Dispense Refill   ibuprofen (ADVIL) 600 MG tablet Take 1 tablet (600 mg total) by mouth every 6 (six) hours as needed. 30 tablet 0   Multiple Vitamin (MULTI VITAMIN PO) Take by mouth.     hydrOXYzine (VISTARIL) 25 MG capsule TAKE 1-3 TABLETS BY MOUTH EVERY DAY AT BEDTIME AS NEEDED FOR INSOMNIA 90 capsule 1   lamoTRIgine (LAMICTAL) 25 MG tablet Take 1 tablet (25 mg total) by mouth daily. 30 tablet 0   LORazepam (ATIVAN) 0.5 MG tablet Take 1 tablet (0.5 mg total) by mouth 2 (two) times daily as needed for anxiety. 15 tablet 0   traZODone (DESYREL) 50 MG tablet 1 tab po qhs prn insomnia 30  tablet 1   No facility-administered medications prior to visit.    Allergies  Allergen Reactions   Shellfish Allergy Nausea And Vomiting    Review of Systems As per HPI  PE:    08/01/2022    1:15 PM 07/17/2022    1:49 PM 05/28/2022   11:53 AM  Vitals with BMI  Height 5' 6"$  5' 6"$  5' 6"$   Weight 218 lbs 10 oz 219 lbs 13 oz 219 lbs 8 oz  BMI 35.3 0000000 [id number]  Systolic [id number]  Diastolic 69 72 78  Pulse 81 87 104     Physical Exam  Gen: Alert, well appearing.  Patient is oriented to person, place, time, and situation. AFFECT: pleasant, lucid thought and speech. No further exam  today  LABS:  Last metabolic panel Lab Results  Component Value Date   GLUCOSE 97 09/12/2021   NA 139 09/12/2021   K 4.1 09/12/2021   CL 112 (H) 09/12/2021   CO2 19 (L) 09/12/2021   BUN 9 09/12/2021   CREATININE 0.66 09/12/2021   GFRNONAA >60 09/12/2021   CALCIUM 8.5 (L) 09/12/2021   PROT 5.7 (L) 09/12/2021   ALBUMIN 2.4 (L) 09/12/2021   LABGLOB 2.8 09/11/2021   AGRATIO 1.3 09/11/2021   BILITOT 0.3 09/12/2021   ALKPHOS 154 (H) 09/12/2021   AST 20 09/12/2021   ALT 14 09/12/2021   ANIONGAP 8 09/12/2021   IMPRESSION AND PLAN:  #1 bipolar depression.  Stabilized, slight improvement seen. Increase Lamictal to 50 mg a day and will likely double this when we follow-up in 2 weeks. Will add SSRI at some point in the near future. She continues to work on setting up counseling.  2.  Insomnia.  Trazodone 50 mg somewhat successful.  Will increase to 100 mg.  An After Visit Summary was printed and given to the patient.  FOLLOW UP: Return in about 2 weeks (around 08/15/2022) for f/u mood/insomnia, virtual ok.  Signed:  Crissie Sickles, MD           08/02/2022

## 2022-08-02 ENCOUNTER — Encounter: Payer: Self-pay | Admitting: Family Medicine

## 2022-08-02 NOTE — Telephone Encounter (Signed)
Please advise,

## 2022-08-05 ENCOUNTER — Encounter: Payer: Self-pay | Admitting: Family Medicine

## 2022-08-06 ENCOUNTER — Encounter: Payer: Self-pay | Admitting: Family Medicine

## 2022-08-06 NOTE — Telephone Encounter (Signed)
Ibuprofen is fine

## 2022-08-06 NOTE — Telephone Encounter (Signed)
Please advise 

## 2022-08-09 ENCOUNTER — Encounter: Payer: Self-pay | Admitting: Family Medicine

## 2022-08-09 MED ORDER — TRAZODONE HCL 50 MG PO TABS: ORAL_TABLET | ORAL | 1 refills | Status: DC

## 2022-08-09 NOTE — Telephone Encounter (Signed)
Okay, new trazodone prescription sent

## 2022-08-09 NOTE — Telephone Encounter (Signed)
Yes, it is fine to take the magnesium

## 2022-08-10 ENCOUNTER — Other Ambulatory Visit: Payer: Self-pay | Admitting: Family Medicine

## 2022-08-11 ENCOUNTER — Encounter: Payer: Self-pay | Admitting: Family Medicine

## 2022-08-16 ENCOUNTER — Telehealth: Payer: 59 | Admitting: Family Medicine

## 2022-08-16 ENCOUNTER — Encounter: Payer: Self-pay | Admitting: Family Medicine

## 2022-08-16 VITALS — Wt 217.0 lb

## 2022-08-16 DIAGNOSIS — F99 Mental disorder, not otherwise specified: Secondary | ICD-10-CM

## 2022-08-16 DIAGNOSIS — F3181 Bipolar II disorder: Secondary | ICD-10-CM | POA: Diagnosis not present

## 2022-08-16 DIAGNOSIS — F5105 Insomnia due to other mental disorder: Secondary | ICD-10-CM | POA: Diagnosis not present

## 2022-08-16 MED ORDER — LAMOTRIGINE 100 MG PO TABS: 100.0000 mg | ORAL_TABLET | Freq: Every day | ORAL | 0 refills | Status: DC

## 2022-08-16 NOTE — Progress Notes (Signed)
Virtual Visit via Video Note  I connected with Susan Chandler  on 08/16/22 at  1:00 PM EST by a video enabled telemedicine application and verified that I am speaking with the correct person using two identifiers.  Location patient: [place] Location provider:work or home office Persons participating in the virtual visit: patient, provider  I discussed the limitations and requested verbal permission for telemedicine visit. The patient expressed understanding and agreed to proceed.  CC: f/u insomnia and mood d/o  32 year old female being seen today for 2 week follow-up mood disorder and insomnia. A/P as of last visit: "#1 bipolar depression.  Stabilized, slight improvement seen. Increase Lamictal to 50 mg a day and will likely double this when we follow-up in 2 weeks. Will add SSRI at some point in the near future. She continues to work on setting up counseling.   #2.  Insomnia.  Trazodone 50 mg somewhat successful.  Will increase to 100 mg."  INTERIM HX: Susan Chandler is improving regarding her mood and her racing thoughts are lessening some.  Still has a significant amount of anxiety and worry. Her sister is boyfriend died unexpectedly a couple days ago and this is obviously affecting Susan Chandler significantly. She seems to be handling it appropriately, though.  Sleep is still problematic but she is improved with taking 3 trazodone and a magnesium tab at bedtime.  If she wakes up later in the night she takes an additional 50 mg trazodone and another magnesium pill She is not oversedated.  ROS: See pertinent positives and negatives per HPI.  Past Medical History:  Diagnosis Date   Abdominal pain    Persistent Mar/Apr 2020: w/u pretty unrevealing by me ([employer]); pt to see GI 10/21/18.   Abnormal uterine bleeding    Dr. Elonda Husky. Nuvaring as of 09/2019   Bipolar disorder ([place])    r/o OCD per psychiatrist 03/2016.  Also, psychologist eval revealed suspected Bipolar II, depressed phase 04/16/2016.   Chronic  pain syndrome    Dysmenorrhea    mgmt per Dr. Elonda Husky (01/2018).  Nuva Ring vag inserts 07/2018.   Erosive gastropathy 10/2018   Nonbleeding (EGD 10/2018)   GAD (generalized anxiety disorder)    Gestational diabetes    Infectious mononucleosis 2007   Lumbosacral radiculopathy at L5 2015   [facility]. [facility]ia [facility]ties at [facility]; then 2nd opinion with Dr. Tonita Cong at [facility], where she got ESI left L5-S1 11/2013 with moderate improvement.  Saw neurosurgeon 01/2016: surgery NOT recommended.   Severe recurrence 01/2021->rpt MRI w/out neural compression->plan for ESI L5-S1 left.   Migraine syndrome    Obesity, Class I, BMI 30-34.9    Pre-syncope 2016   ? POTS.  Holter showed mostly NSR, occ PACs, one run of nonsustained atrial tachy.   Pregnancy induced hypertension    Salmonella enteritis 09/2017   Scoliosis     Past Surgical History:  Procedure Laterality Date   BIOPSY  10/26/2018   Procedure: BIOPSY;  Surgeon: Rogene Houston, MD;  [facility];  Service: Endoscopy;;   CESAREAN SECTION N/A 09/17/2021   Procedure: CESAREAN SECTION;  Surgeon: Clarnce Flock, MD;  Location: [facility];  Service: Obstetrics;  Laterality: N/A;   ESOPHAGOGASTRODUODENOSCOPY  10/2018   Nonbleeding erosive gastropathy, H pylori NEG.   ESOPHAGOGASTRODUODENOSCOPY (EGD) WITH PROPOFOL N/A 10/26/2018   Procedure: ESOPHAGOGASTRODUODENOSCOPY (EGD) WITH PROPOFOL;  Surgeon: Rogene Houston, MD;  [facility];  Service: Endoscopy;  Laterality: N/A;   WISDOM TOOTH EXTRACTION  Current Outpatient Medications:    ibuprofen (ADVIL) 600 MG tablet, Take 1 tablet (600 mg total) by mouth every 6 (six) hours as needed., Disp: 30 tablet, Rfl: 0   lamoTRIgine (LAMICTAL) 25 MG tablet, 2 tabs po qd, Disp: 60 tablet, Rfl: 0   Multiple Vitamin (MULTI VITAMIN PO), Take by mouth., Disp: , Rfl:    traZODone (DESYREL) 50 MG tablet, 3 tabs po qhs prn insomnia, Disp: 90 tablet, Rfl: 1  EXAM:  VITALS per patient  if applicable:     0000000   11:17 AM 08/01/2022    1:15 PM 07/17/2022    1:49 PM  Vitals with BMI  Height  '5\' 6"'$  '5\' 6"'$   Weight 217 lbs 218 lbs 10 oz 219 lbs 13 oz  BMI 35.04 A999333 0000000  Systolic  A999333 A999333  Diastolic  69 72  Pulse  81 87    GENERAL: alert, oriented, appears well and in no acute distress  HEENT: atraumatic, conjunttiva clear, no obvious abnormalities on inspection of external nose and ears  NECK: normal movements of the head and neck  LUNGS: on inspection no signs of respiratory distress, breathing rate appears normal, no obvious gross SOB, gasping or wheezing  CV: no obvious cyanosis  MS: moves all visible extremities without noticeable abnormality  PSYCH/NEURO: pleasant and cooperative, no obvious depression or anxiety, speech and thought processing grossly intact  LABS: none today    Chemistry      Component Value Date/Time   NA 139 09/12/2021 2020   NA 140 09/11/2021 1224   K 4.1 09/12/2021 2020   CL 112 (H) 09/12/2021 2020   CO2 19 (L) 09/12/2021 2020   BUN 9 09/12/2021 2020   BUN 10 09/11/2021 1224   CREATININE 0.66 09/12/2021 2020      Component Value Date/Time   CALCIUM 8.5 (L) 09/12/2021 2020   ALKPHOS 154 (H) 09/12/2021 2020   AST 20 09/12/2021 2020   ALT 14 09/12/2021 2020   BILITOT 0.3 09/12/2021 2020   BILITOT <0.2 09/11/2021 1224     ASSESSMENT AND PLAN:  Discussed the following assessment and plan:  #1 bipolar 2 disorder. Gradually improving on Lamictal.  Increase dose to 100 mg a day. She is working on establishing counseling with Dr. Greggory Stallion her paperwork has been sent in and she is awaiting.  2.  Insomnia. Hopefully will improve gradually as we get her Lamictal increased. Additionally, okay to continue 150 mg trazodone nightly and also can give an additional 50 mg in the middle the night if she wakes up and cannot go back to sleep. Continue magnesium.  May add melatonin 5 to 10 mg nightly as needed.   I discussed  the assessment and treatment plan with the patient. The patient was provided an opportunity to ask questions and all were answered. The patient agreed with the plan and demonstrated an understanding of the instructions.   F/u: 2 weeks  Signed:  Crissie Sickles, MD           08/16/2022

## 2022-08-31 ENCOUNTER — Other Ambulatory Visit: Payer: Self-pay | Admitting: Family Medicine

## 2022-09-04 ENCOUNTER — Telehealth (INDEPENDENT_AMBULATORY_CARE_PROVIDER_SITE_OTHER): Payer: 59 | Admitting: Family Medicine

## 2022-09-04 ENCOUNTER — Encounter: Payer: Self-pay | Admitting: Family Medicine

## 2022-09-04 VITALS — Wt 218.0 lb

## 2022-09-04 DIAGNOSIS — F3181 Bipolar II disorder: Secondary | ICD-10-CM

## 2022-09-04 DIAGNOSIS — F99 Mental disorder, not otherwise specified: Secondary | ICD-10-CM

## 2022-09-04 DIAGNOSIS — F5105 Insomnia due to other mental disorder: Secondary | ICD-10-CM

## 2022-09-04 MED ORDER — LAMOTRIGINE 200 MG PO TABS: 200.0000 mg | ORAL_TABLET | Freq: Every day | ORAL | 1 refills | Status: DC

## 2022-09-04 NOTE — Progress Notes (Signed)
Virtual Visit via Video Note  I connected with Saylor  on 09/04/22 at  3:20 PM EDT by a video enabled telemedicine application and verified that I am speaking with the correct person using two identifiers.  Location patient: [place] Location provider:work or home office Persons participating in the virtual visit: patient, provider  I discussed the limitations and requested verbal permission for telemedicine visit. The patient expressed understanding and agreed to proceed.  CC: 32 year old female being seen today for 3-week follow-up bipolar 2 disorder and insomnia. A/P as of last visit: "#1 bipolar 2 disorder. Gradually improving on Lamictal.  Increase dose to 100 mg a day. She is working on establishing counseling with Dr. Greggory Stallion her paperwork has been sent in and she is awaiting.   2.  Insomnia. Hopefully will improve gradually as we get her Lamictal increased. Additionally, okay to continue 150 mg trazodone nightly and also can give an additional 50 mg in the middle the night if she wakes up and cannot go back to sleep. Continue magnesium.  May add melatonin 5 to 10 mg nightly as needed."  INTERIM HX: Overall she feels like she is continuing to improve on Lamictal. She had a rough week last week, some high emotions came out in a fight among family members.  She is still processing lots of things/traumatic events in her life. She is looking forward to starting therapy on April 25. She feels like she is sleeping better overall, more good nights lately than bad nights.  She takes anywhere from 2-4 of her trazodone 50 mg tabs at bedtime.    ROS: See pertinent positives and negatives per HPI.  Past Medical History:  Diagnosis Date   Abdominal pain    Persistent Mar/Apr 2020: w/u pretty unrevealing by me ([employer]); pt to see GI 10/21/18.   Abnormal uterine bleeding    Dr. Elonda Husky. Nuvaring as of 09/2019   Bipolar disorder ([place])    r/o OCD per psychiatrist 03/2016.  Also,  psychologist eval revealed suspected Bipolar II, depressed phase 04/16/2016.   Chronic pain syndrome    Dysmenorrhea    mgmt per Dr. Elonda Husky (01/2018).  Nuva Ring vag inserts 07/2018.   Erosive gastropathy 10/2018   Nonbleeding (EGD 10/2018)   GAD (generalized anxiety disorder)    Gestational diabetes    Infectious mononucleosis 2007   Lumbosacral radiculopathy at L5 2015   Dr. Maia Petties at [facility]; then 2nd opinion with Dr. Tonita Cong at [facility], where she got ESI left L5-S1 11/2013 with moderate improvement.  Saw neurosurgeon 01/2016: surgery NOT recommended.   Severe recurrence 01/2021->rpt MRI w/out neural compression->plan for ESI L5-S1 left.   Migraine syndrome    Obesity, Class I, BMI 30-34.9    Pre-syncope 2016   ? POTS.  Holter showed mostly NSR, occ PACs, one run of nonsustained atrial tachy.   Pregnancy induced hypertension    Salmonella enteritis 09/2017   Scoliosis     Past Surgical History:  Procedure Laterality Date   BIOPSY  10/26/2018   Procedure: BIOPSY;  Surgeon: Rogene Houston, MD;  [facility];  Service: Endoscopy;;   CESAREAN SECTION N/A 09/17/2021   Procedure: CESAREAN SECTION;  Surgeon: Clarnce Flock, MD;  Location: [facility];  Service: Obstetrics;  Laterality: N/A;   ESOPHAGOGASTRODUODENOSCOPY  10/2018   Nonbleeding erosive gastropathy, H pylori NEG.   ESOPHAGOGASTRODUODENOSCOPY (EGD) WITH PROPOFOL N/A 10/26/2018   Procedure: ESOPHAGOGASTRODUODENOSCOPY (EGD) WITH PROPOFOL;  Surgeon: Rogene Houston, MD;  [facility];  Service: Endoscopy;  Laterality: N/A;   WISDOM TOOTH EXTRACTION       Current Outpatient Medications:    lamoTRIgine (LAMICTAL) 100 MG tablet, Take 1 tablet (100 mg total) by mouth daily., Disp: 30 tablet, Rfl: 0   MAGNESIUM PO, Take by mouth at bedtime., Disp: , Rfl:    Multiple Vitamin (MULTI VITAMIN PO), Take by mouth., Disp: , Rfl:    traZODone (DESYREL) 50 MG tablet, 3 tabs po qhs prn insomnia, Disp:  90 tablet, Rfl: 1   ibuprofen (ADVIL) 600 MG tablet, Take 1 tablet (600 mg total) by mouth every 6 (six) hours as needed. (Patient not taking: Reported on 09/04/2022), Disp: 30 tablet, Rfl: 0  EXAM:  VITALS per patient if applicable:     [id number]    3:20 PM 08/16/2022   11:17 AM 08/01/2022    1:15 PM  Vitals with BMI  Height   '5\' 6"'$   Weight 218 lbs 217 lbs 218 lbs 10 oz  BMI  123456 A999333  Systolic   A999333  Diastolic   69  Pulse   81    GENERAL: alert, oriented, appears well and in no acute distress  HEENT: atraumatic, conjunttiva clear, no obvious abnormalities on inspection of external nose and ears  NECK: normal movements of the head and neck  LUNGS: on inspection no signs of respiratory distress, breathing rate appears normal, no obvious gross SOB, gasping or wheezing  CV: no obvious cyanosis  MS: moves all visible extremities without noticeable abnormality  PSYCH/NEURO: pleasant and cooperative, no obvious depression or anxiety, speech and thought processing grossly intact  ASSESSMENT AND PLAN:  Discussed the following assessment and plan:  Bipolar 2 disorder, chronic anxiety, insomnia. Continues to improve gradually on Lamictal. Will increase to 200 mg daily. Continue trazodone 50 mg tabs, 2-4 nightly. Also okay to use magnesium and melatonin nightly as needed. She will be starting therapy in about 6 weeks.   I discussed the assessment and treatment plan with the patient. The patient was provided an opportunity to ask questions and all were answered. The patient agreed with the plan and demonstrated an understanding of the instructions.   F/u: 1 month  Signed:  Crissie Sickles, MD           09/04/2022

## 2022-09-05 NOTE — Telephone Encounter (Signed)
Saline nasal spray. Mucinex for cough/congestion.

## 2022-09-16 ENCOUNTER — Encounter: Payer: Self-pay | Admitting: Family Medicine

## 2022-09-16 ENCOUNTER — Encounter: Payer: Self-pay | Admitting: Women's Health

## 2022-09-24 ENCOUNTER — Encounter: Payer: Self-pay | Admitting: Family Medicine

## 2022-09-25 ENCOUNTER — Encounter: Payer: Self-pay | Admitting: Women's Health

## 2022-09-27 ENCOUNTER — Ambulatory Visit: Payer: 59 | Admitting: Obstetrics & Gynecology

## 2022-09-27 ENCOUNTER — Emergency Department (HOSPITAL_COMMUNITY)
Admission: EM | Admit: 2022-09-27 | Discharge: 2022-09-28 | Disposition: A | Discharge status: Home / Self Care | Payer: 59 | Attending: Emergency Medicine | Admitting: Emergency Medicine

## 2022-09-27 ENCOUNTER — Other Ambulatory Visit: Payer: Self-pay

## 2022-09-27 ENCOUNTER — Encounter (HOSPITAL_COMMUNITY): Payer: Self-pay

## 2022-09-27 ENCOUNTER — Encounter: Payer: Self-pay | Admitting: Obstetrics & Gynecology

## 2022-09-27 ENCOUNTER — Emergency Department (HOSPITAL_COMMUNITY): Payer: 59

## 2022-09-27 VITALS — BP 127/81 | HR 89 | Ht 66.0 in | Wt 222.0 lb

## 2022-09-27 DIAGNOSIS — N6452 Nipple discharge: Secondary | ICD-10-CM | POA: Diagnosis not present

## 2022-09-27 DIAGNOSIS — Z30015 Encounter for initial prescription of vaginal ring hormonal contraceptive: Secondary | ICD-10-CM

## 2022-09-27 DIAGNOSIS — R0602 Shortness of breath: Secondary | ICD-10-CM | POA: Insufficient documentation

## 2022-09-27 DIAGNOSIS — R002 Palpitations: Secondary | ICD-10-CM | POA: Insufficient documentation

## 2022-09-27 LAB — CBC WITH DIFFERENTIAL/PLATELET
Abs Immature Granulocytes: 0.03 10*3/uL (ref 0.00–0.07)
Basophils Absolute: 0 10*3/uL (ref 0.0–0.1)
Basophils Relative: 0 %
Eosinophils Absolute: 0.1 10*3/uL (ref 0.0–0.5)
Eosinophils Relative: 1 %
HCT: 42.1 % (ref 36.0–46.0)
Hemoglobin: 13.9 g/dL (ref 12.0–15.0)
Immature Granulocytes: 0 %
Lymphocytes Relative: 25 %
Lymphs Abs: 2.8 10*3/uL (ref 0.7–4.0)
MCH: 30.2 pg (ref 26.0–34.0)
MCHC: 33 g/dL (ref 30.0–36.0)
MCV: 91.5 fL (ref 80.0–100.0)
Monocytes Absolute: 0.8 10*3/uL (ref 0.1–1.0)
Monocytes Relative: 7 %
Neutro Abs: 7.4 10*3/uL (ref 1.7–7.7)
Neutrophils Relative %: 67 %
Platelets: 368 10*3/uL (ref 150–400)
RBC: 4.6 MIL/uL (ref 3.87–5.11)
RDW: 13.1 % (ref 11.5–15.5)
WBC: 11.2 10*3/uL — ABNORMAL HIGH (ref 4.0–10.5)
nRBC: 0 % (ref 0.0–0.2)

## 2022-09-27 LAB — BASIC METABOLIC PANEL
Anion gap: 8 (ref 5–15)
BUN: 9 mg/dL (ref 6–20)
CO2: 25 mmol/L (ref 22–32)
Calcium: 9 mg/dL (ref 8.9–10.3)
Chloride: 105 mmol/L (ref 98–111)
Creatinine, Ser: 0.7 mg/dL (ref 0.44–1.00)
GFR, Estimated: >60 mL/min (ref >60)
Glucose, Bld: 97 mg/dL (ref 70–99)
Potassium: 4.4 mmol/L (ref 3.5–5.1)
Sodium: 138 mmol/L (ref 135–145)

## 2022-09-27 LAB — D-DIMER, QUANTITATIVE: D-Dimer, Quant: <0.27 ug/mL-FEU (ref 0.00–0.50)

## 2022-09-27 LAB — TROPONIN I (HIGH SENSITIVITY): Troponin I (High Sensitivity): <2 ng/L (ref <18)

## 2022-09-27 MED ORDER — ETONOGESTREL-ETHINYL ESTRADIOL 0.12-0.015 MG/24HR VA RING: VAGINAL_RING | VAGINAL | 12 refills | Status: DC

## 2022-09-27 NOTE — ED Provider Notes (Signed)
[place] EMERGENCY DEPARTMENT AT [facility] Provider Note   CSN: [id number] Arrival date & time: 09/27/22  2115     History  Chief Complaint  Patient presents with   Palpitations    Susan Chandler is a 32 y.o. female.  32 year old female who presents the ER today with palpitations.  Patient states that she had them off and on for couple weeks now but tonight she had multiple minutes in a row where they did not get better.  They are associated with shortness of breath.  Did not seem to have any inciting or relieving factor.  Has not had any in the couple hours that she has been here.  No recent drug, alcohol or tobacco use.  No changes in her caffeine use.  No recent illnesses.  She is currently uptitrating her Lamictal after her recent pregnancy.  She states she has a pretty significant anxiety/nervousness issue and is not sure if that was going on this time.  She states that she just felt a pounding in her chest.   Palpitations      Home Medications Prior to Admission medications   Medication Sig Start Date End Date Taking? Authorizing Provider  etonogestrel-ethinyl estradiol (NUVARING) 0.12-0.015 MG/24HR vaginal ring Insert vaginally and leave in place for 3 consecutive weeks, then remove for 1 week. 09/27/22   Lazaro Arms, MD  ibuprofen (ADVIL) 600 MG tablet Take 1 tablet (600 mg total) by mouth every 6 (six) hours as needed. 02/18/22   Jacalyn Lefevre, MD  lamoTRIgine (LAMICTAL) 200 MG tablet Take 1 tablet (200 mg total) by mouth daily. 09/04/22   McGowen, Maryjean Morn, MD  MAGNESIUM PO Take by mouth at bedtime.    [provider]  Multiple Vitamin (MULTI VITAMIN PO) Take by mouth.    [provider]  traZODone (DESYREL) 50 MG tablet 3 tabs po qhs prn insomnia 08/09/22   McGowen, Maryjean Morn, MD      Allergies    Shellfish allergy    Review of Systems   Review of Systems  Cardiovascular:  Positive for palpitations.    Physical Exam Updated Vital  Signs BP 129/84   Pulse 84   Temp 98.1 F (36.7 C) (Oral)   Resp 20   Wt 99.8 kg   LMP 08/29/2022   SpO2 100%   BMI 35.51 kg/m  Physical Exam Vitals and nursing note reviewed.  Constitutional:      Appearance: She is well-developed.  HENT:     Head: Normocephalic and atraumatic.     Mouth/Throat:     Mouth: Mucous membranes are moist.     Pharynx: Oropharynx is clear.  Eyes:     Pupils: Pupils are equal, round, and reactive to light.  Cardiovascular:     Rate and Rhythm: Normal rate and regular rhythm.  Pulmonary:     Effort: No respiratory distress.     Breath sounds: No stridor. No wheezing or rales.  Abdominal:     General: Abdomen is flat. There is no distension.  Musculoskeletal:        General: No swelling. Normal range of motion.     Cervical back: Normal range of motion.  Skin:    General: Skin is warm and dry.  Neurological:     General: No focal deficit present.     Mental Status: She is alert.     ED Results / Procedures / Treatments   Labs (all labs ordered are listed, but only abnormal  results are displayed) Labs Reviewed  CBC WITH DIFFERENTIAL/PLATELET - Abnormal; Notable for the following components:      Result Value   WBC 11.2 (*)    All other components within normal limits  BASIC METABOLIC PANEL  D-DIMER, QUANTITATIVE  TROPONIN I (HIGH SENSITIVITY)    EKG None  Radiology DG Chest Portable 1 View  Result Date: 09/27/2022 CLINICAL DATA:  Chest pain EXAM: PORTABLE CHEST 1 VIEW COMPARISON:  Abdominal series 10/03/2017 FINDINGS: The heart size and mediastinal contours are within normal limits. Both lungs are clear. The visualized skeletal structures are unremarkable. IMPRESSION: No active disease. Electronically Signed   By: Darliss CheneyAmy  Guttmann M.D.   On: 09/27/2022 22:30    Procedures Procedures    Medications Ordered in ED Medications - No data to display  ED Course/ Medical Decision Making/ A&P                             Medical  Decision Making  Patient's labs reviewed no significant anemia, dehydration, electrolyte abnormality to suggest an arrhythmia.  D-dimer was undetectable as was her troponin making any type of primary cardiac or pulmonary embolus unlikely as well.  She is currently in the room with a heart rate of 75 sinus on the monitor.  When I start talking to her heart rate does go up to 105-110.  It is still sinus and quickly improved back down to the 70s to 80s.  I suspect that this may be related to nervousness or anxiety however could be a side effect from uptitrating the Lamictal.  She has an appoint with her doctor on Monday.  She will keep track of her heart rate until then and also a journal of how often it is happening.  Final Clinical Impression(s) / ED Diagnoses Final diagnoses:  Palpitations    Rx / DC Orders ED Discharge Orders     None         Saphronia Ozdemir, Barbara CowerJason, MD 09/28/22 0000

## 2022-09-27 NOTE — ED Provider Triage Note (Signed)
Emergency Medicine Provider Triage Evaluation Note  Susan Chandler , a 31 y.o. female  was evaluated in triage.  Pt complains of palpitations x 1 week.  She describes sensation of "feels like someone pounding me in the chest with a fist."  Symptoms have been intermittent, but not improving.  When episodes occur, usually last several minutes and is associated with shortness of breath.  No known aggravating factors.  History of anxiety but states this does not feel like her previous anxiety.  Began taking Lamictal 2 months ago, but took medication previously without any issues.  Review of Systems  Positive: Palpitations, shortness of breath Negative: Back pain, cough, arm neck or jaw pain  Physical Exam  BP 129/84   Pulse 84   Temp 98.1 F (36.7 C) (Oral)   Resp 20   Wt 99.8 kg   LMP 08/29/2022   SpO2 100%   BMI 35.51 kg/m  Gen:   Awake, no distress   Resp:  Normal effort  MSK:   Moves extremities without difficulty  Other:    Medical Decision Making  Medically screening exam initiated at 10:02 PM.  Appropriate orders placed.  Susan Chandler was informed that the remainder of the evaluation will be completed by another provider, this initial triage assessment does not replace that evaluation, and the importance of remaining in the ED until their evaluation is complete.     [name], PA-C 09/27/22 2208

## 2022-09-27 NOTE — Progress Notes (Signed)
Chief Complaint  Patient presents with   Breast Discharge    Left breast      32 y.o. G1P1001 Patient's last menstrual period was 08/29/2022. The current method of family planning is none.  Outpatient Encounter Medications as of 09/27/2022  Medication Sig   etonogestrel-ethinyl estradiol (NUVARING) 0.12-0.015 MG/24HR vaginal ring Insert vaginally and leave in place for 3 consecutive weeks, then remove for 1 week.   ibuprofen (ADVIL) 600 MG tablet Take 1 tablet (600 mg total) by mouth every 6 (six) hours as needed.   lamoTRIgine (LAMICTAL) 200 MG tablet Take 1 tablet (200 mg total) by mouth daily.   MAGNESIUM PO Take by mouth at bedtime.   Multiple Vitamin (MULTI VITAMIN PO) Take by mouth.   traZODone (DESYREL) 50 MG tablet 3 tabs po qhs prn insomnia   No facility-administered encounter medications on file as of 09/27/2022.    Subjective Pt had a small amount of clear discharge of her left breast Has tender breast L>R Mammogram 7/23 normal She is due to start her menses anytime Past Medical History:  Diagnosis Date   Abdominal pain    Persistent Mar/Apr 2020: w/u pretty unrevealing by me ([place]); pt to see GI 10/21/18.   Abnormal uterine bleeding    Dr. Despina HiddenEure. Nuvaring as of 09/2019   Bipolar disorder    r/o OCD per psychiatrist 03/2016.  Also, psychologist eval revealed suspected Bipolar II, depressed phase 04/16/2016.   Chronic pain syndrome    Dysmenorrhea    mgmt per Dr. Despina HiddenEure (01/2018).  Nuva Ring vag inserts 07/2018.   Erosive gastropathy 10/2018   Nonbleeding (EGD 10/2018)   GAD (generalized anxiety disorder)    Gestational diabetes    Infectious mononucleosis 2007   Lumbosacral radiculopathy at L5 2015   [facility]; then [facility]nd [facility], where she got ESI left L5-S1 11/2013 with moderate improvement.  Saw neurosurgeon 01/2016: surgery NOT recommended.   Severe recurrence 01/2021->rpt MRI w/out neural  compression->plan for ESI L5-S1 left.   Migraine syndrome    Obesity, Class I, BMI 30-34.9    Pre-syncope 2016   ? POTS.  Holter showed mostly NSR, occ PACs, one run of nonsustained atrial tachy.   Pregnancy induced hypertension    Salmonella enteritis 09/2017   Scoliosis     Past Surgical History:  Procedure Laterality Date   BIOPSY  10/26/2018   Procedure: BIOPSY;  Surgeon: Malissa Hippoehman, Najeeb U, MD;  [facility];  Service: Endoscopy;;   CESAREAN SECTION N/A 09/17/2021   Procedure: CESAREAN SECTION;  Surgeon: Venora MaplesEckstat, Matthew M, MD;  [facility];  Service: Obstetrics;  Laterality: N/A;   ESOPHAGOGASTRODUODENOSCOPY  10/2018   Nonbleeding erosive gastropathy, H pylori NEG.   ESOPHAGOGASTRODUODENOSCOPY (EGD) WITH PROPOFOL N/A 10/26/2018   Procedure: ESOPHAGOGASTRODUODENOSCOPY (EGD) WITH PROPOFOL;  Surgeon: Malissa Hippoehman, Najeeb U, MD;  [facility];  Service: Endoscopy;  Laterality: N/A;   WISDOM TOOTH EXTRACTION      OB History     Gravida  1   Para  1   Term  1   Preterm  0   AB  0   Living  1      SAB  0   IAB  0   Ectopic  0   Multiple  0   Live Births  1           Allergies  Allergen Reactions  Shellfish Allergy Nausea And Vomiting    Social History   Socioeconomic History   Marital status: Married    Spouse name: Not on file   Number of children: Not on file   Years of education: Not on file   Highest education level: Not on file  Occupational History   Not on file  Tobacco Use   Smoking status: Never   Smokeless tobacco: Never  Vaping Use   Vaping Use: Never used  Substance and Sexual Activity   Alcohol use: No    Comment: 04-05-2016 per pt no   Drug use: No    Comment: 04-05-2016 per pt no    Sexual activity: Yes    Birth control/protection: Condom  Other Topics Concern   Not on file  Social History Narrative   Married, one son.   College: [employer].   Occupation: Used to work at [employer] in [place]   [place], [place] in [place].   No T/A/Ds.   No exercise.   Social Determinants of Health   Financial Resource Strain: Low Risk  (10/29/2021)   Overall Financial Resource Strain (CARDIA)    Difficulty of Paying Living Expenses: Not very hard  Food Insecurity: No Food Insecurity (10/29/2021)   Hunger Vital Sign    Worried About Running Out of Food in the Last Year: Never true    Ran Out of Food in the Last Year: Never true  Transportation Needs: No Transportation Needs (10/29/2021)   PRAPARE - Administrator, Civil ServiceTransportation    Lack of Transportation (Medical): No    Lack of Transportation (Non-Medical): No  Physical Activity: Sufficiently Active (10/29/2021)   Exercise Vital Sign    Days of Exercise per Week: 5 days    Minutes of Exercise per Session: 40 min  Stress: No Stress Concern Present (10/29/2021)   [employer] - Occupational Stress Questionnaire    Feeling of Stress : Only a little  Social Connections: Moderately Isolated (10/29/2021)   Social Connection and Isolation Panel [NHANES]    Frequency of Communication with Friends and Family: More than three times a week    Frequency of Social Gatherings with Friends and Family: More than three times a week    Attends Religious Services: Never    Database administratorActive Member of Clubs or Organizations: No    Attends Engineer, structuralClub or Organization Meetings: Never    Marital Status: Married    Family History  Problem Relation Age of Onset   Alcohol abuse Maternal Grandfather    Bipolar disorder Father    Bipolar disorder Sister    Other Sister        e coli   Cancer Maternal Aunt     Medications:       Current Outpatient Medications:    etonogestrel-ethinyl estradiol (NUVARING) 0.12-0.015 MG/24HR vaginal ring, Insert vaginally and leave in place for 3 consecutive weeks, then remove for 1 week., Disp: 1 each, Rfl: 12   ibuprofen (ADVIL) 600 MG tablet, Take 1 tablet (600 mg total) by mouth every 6 (six) hours as needed., Disp: 30 tablet, Rfl: 0    lamoTRIgine (LAMICTAL) 200 MG tablet, Take 1 tablet (200 mg total) by mouth daily., Disp: 30 tablet, Rfl: 1   MAGNESIUM PO, Take by mouth at bedtime., Disp: , Rfl:    Multiple Vitamin (MULTI VITAMIN PO), Take by mouth., Disp: , Rfl:    traZODone (DESYREL) 50 MG tablet, 3 tabs po qhs prn insomnia, Disp: 90 tablet, Rfl: 1  Objective Blood  pressure 127/81, pulse 89, height 5\' 6"  (1.676 m), weight 222 lb (100.7 kg), last menstrual period 08/29/2022, not currently breastfeeding.  Breast exam is normal bilaterally, no masses tenderness skin or nipple changes I diligently tried "milking" the left breast and got 1 drop clear Montgomery gland fluid that I looked at under the microscope, no fat or milk noted, no macrophages seen, acellular, consistent with Montgomery gland output  Pertinent ROS No burning with urination, frequency or urgency No nausea, vomiting or diarrhea Nor fever chills or other constitutional symptoms   Labs or studies     Impression + Management Plan: Diagnoses this Encounter::   ICD-10-CM   1. Breast discharge, clear small amount, Montgomery gland  N64.52     2. Encounter for initial prescription of vaginal ring hormonal contraceptive  Z30.015         Medications prescribed during  this encounter: Meds ordered this encounter  Medications   etonogestrel-ethinyl estradiol (NUVARING) 0.12-0.015 MG/24HR vaginal ring    Sig: Insert vaginally and leave in place for 3 consecutive weeks, then remove for 1 week.    Dispense:  1 each    Refill:  12    Labs or Scans Ordered during this encounter: No orders of the defined types were placed in this encounter.     Follow up Return if symptoms worsen or fail to improve.

## 2022-09-27 NOTE — ED Triage Notes (Signed)
Pt c/o palpitations that started this week. States that it is making her feel SOB. Pt started taking Lamictal x2 months ago and is wondering if that is causing it. Does have hx of anxiety.

## 2022-09-28 ENCOUNTER — Other Ambulatory Visit: Payer: Self-pay | Admitting: Family Medicine

## 2022-09-30 ENCOUNTER — Encounter: Payer: Self-pay | Admitting: Family Medicine

## 2022-09-30 ENCOUNTER — Encounter: Payer: Self-pay | Admitting: Obstetrics & Gynecology

## 2022-09-30 ENCOUNTER — Telehealth (INDEPENDENT_AMBULATORY_CARE_PROVIDER_SITE_OTHER): Payer: Self-pay | Admitting: Family Medicine

## 2022-09-30 VITALS — Ht 66.0 in | Wt 220.0 lb

## 2022-09-30 DIAGNOSIS — F411 Generalized anxiety disorder: Secondary | ICD-10-CM

## 2022-09-30 DIAGNOSIS — R002 Palpitations: Secondary | ICD-10-CM

## 2022-09-30 DIAGNOSIS — F3177 Bipolar disorder, in partial remission, most recent episode mixed: Secondary | ICD-10-CM

## 2022-09-30 DIAGNOSIS — F99 Mental disorder, not otherwise specified: Secondary | ICD-10-CM

## 2022-09-30 DIAGNOSIS — F5105 Insomnia due to other mental disorder: Secondary | ICD-10-CM

## 2022-09-30 MED ORDER — TRAZODONE HCL 50 MG PO TABS: ORAL_TABLET | ORAL | 1 refills | Status: DC

## 2022-09-30 MED ORDER — LAMOTRIGINE 200 MG PO TABS: 200.0000 mg | ORAL_TABLET | Freq: Every day | ORAL | 0 refills | Status: DC

## 2022-09-30 MED ORDER — SERTRALINE HCL 25 MG PO TABS: ORAL_TABLET | ORAL | 0 refills | Status: DC

## 2022-09-30 NOTE — Telephone Encounter (Signed)
Pt has upcoming appt today, will address refill during that time.

## 2022-09-30 NOTE — Progress Notes (Signed)
Virtual Visit via Video Note  I connected with Susan Chandler  on 09/30/22 at  2:00 PM EDT by a video enabled telemedicine application and verified that I am speaking with the correct person using two identifiers.  Location patient: [place] Location provider:work or home office Persons participating in the virtual visit: patient, provider  I discussed the limitations and requested verbal permission for telemedicine visit. The patient expressed understanding and agreed to proceed.  CC:  32 year old female being seen today for 3-week follow-up bipolar disorder, anxiety, and insomnia. Additionally, follow-up ED visit 3 days ago for palpitations. A/P as of last visit: "Bipolar 2 disorder, chronic anxiety, insomnia. Continues to improve gradually on Lamictal. Will increase to 200 mg daily. Continue trazodone 50 mg tabs, 2-4 nightly. Also okay to use magnesium and melatonin nightly as needed. She will be starting therapy in about 6 weeks."  INTERIM HX: She went to the emergency department on 09/27/2022 for having had a period of palpitations that same day. Workup negative/reassuring and it was felt possibly anxiety was playing a role but also possibly her recent up titration of Lamictal.  Doing better, no significant periods of palpitations since the ED visit. She does note that it has been a tough week or so, dealing with a lot more anxiety, focusing on her physical symptoms quite a bit and letting her mind roam to the worst scenarios. On a good note, she feels like she is in a good place from an emotional and mood standpoint.  She is sleeping better.    She has been having a little bit of chronic breast discomfort and recently some left nipple discharge.  She saw Dr. Despina Hidden her GYN and was prescribed NuvaRing but she has not started this yet.  She was reassured that her breast discharge was benign.  ROS: See pertinent positives and negatives per HPI.  Past Medical History:  Diagnosis Date   Abdominal  pain    Persistent Mar/Apr 2020: w/u pretty unrevealing by me (Isela Stantz); pt to see GI 10/21/18.   Abnormal uterine bleeding    Dr. Despina Hidden. Nuvaring as of 09/2019   Bipolar disorder    r/o OCD per psychiatrist 03/2016.  Also, psychologist eval revealed suspected Bipolar II, depressed phase 04/16/2016.   Chronic pain syndrome    Dysmenorrhea    mgmt per Dr. Despina Hidden (01/2018).  Nuva Ring vag inserts 07/2018.   Erosive gastropathy 10/2018   Nonbleeding (EGD 10/2018)   GAD (generalized anxiety disorder)    Gestational diabetes    Infectious mononucleosis 2007   Lumbosacral radiculopathy at L5 2015   [facility]; then 2nd opinion with Dr. Shelle Iron at [facility], where she got ESI left L5-S1 11/2013 with moderate improvement.  Saw neurosurgeon 01/2016: surgery NOT recommended.   Severe recurrence 01/2021->rpt MRI w/out neural compression->plan for ESI L5-S1 left.   Migraine syndrome    Obesity, Class I, BMI 30-34.9    Pre-syncope 2016   ? POTS.  Holter showed mostly NSR, occ PACs, one run of nonsustained atrial tachy.   Pregnancy induced hypertension    Salmonella enteritis 09/2017   Scoliosis     Past Surgical History:  Procedure Laterality Date   BIOPSY  10/26/2018   Procedure: BIOPSY;  Surgeon: Malissa Hippo, MD;  [facility];  Service: Endoscopy;;   CESAREAN SECTION N/A 09/17/2021   Procedure: CESAREAN SECTION;  Surgeon: Venora Maples, MD;  [facility];  Service: Obstetrics;  Laterality: N/A;  ESOPHAGOGASTRODUODENOSCOPY  10/2018   Nonbleeding erosive gastropathy, H pylori NEG.   ESOPHAGOGASTRODUODENOSCOPY (EGD) WITH PROPOFOL N/A 10/26/2018   Procedure: ESOPHAGOGASTRODUODENOSCOPY (EGD) WITH PROPOFOL;  Surgeon: Malissa Hippo, MD;  [facility];  Service: Endoscopy;  Laterality: N/A;   WISDOM TOOTH EXTRACTION       Current Outpatient Medications:    etonogestrel-ethinyl estradiol (NUVARING) 0.12-0.015 MG/24HR vaginal ring,  Insert vaginally and leave in place for 3 consecutive weeks, then remove for 1 week., Disp: 1 each, Rfl: 12   ibuprofen (ADVIL) 600 MG tablet, Take 1 tablet (600 mg total) by mouth every 6 (six) hours as needed., Disp: 30 tablet, Rfl: 0   MAGNESIUM PO, Take by mouth at bedtime., Disp: , Rfl:    Multiple Vitamin (MULTI VITAMIN PO), Take by mouth., Disp: , Rfl:    sertraline (ZOLOFT) 25 MG tablet, 1 tab po qd x 7d then increase to 2 tabs qd, Disp: 53 tablet, Rfl: 0   lamoTRIgine (LAMICTAL) 200 MG tablet, Take 1 tablet (200 mg total) by mouth daily., Disp: 90 tablet, Rfl: 0   traZODone (DESYREL) 50 MG tablet, 3 tabs po qhs prn insomnia, Disp: 90 tablet, Rfl: 1  EXAM:  VITALS per patient if applicable:      09/30/2022    2:10 PM 09/28/2022   12:00 AM 09/27/2022    9:40 PM  Vitals with BMI  Height 5\' 6"     Weight 220 lbs    BMI 35.53    Systolic  116 129  Diastolic  68 84  Pulse  68 84    GENERAL: alert, oriented, appears well and in no acute distress  HEENT: atraumatic, conjunttiva clear, no obvious abnormalities on inspection of external nose and ears  NECK: normal movements of the head and neck  LUNGS: on inspection no signs of respiratory distress, breathing rate appears normal, no obvious gross SOB, gasping or wheezing  CV: no obvious cyanosis  MS: moves all visible extremities without noticeable abnormality  PSYCH/NEURO: pleasant and cooperative, no obvious depression or anxiety, speech and thought processing grossly intact  LABS: none today    Chemistry      Component Value Date/Time   NA 138 09/27/2022 2221   NA 140 09/11/2021 1224   K 4.4 09/27/2022 2221   CL 105 09/27/2022 2221   CO2 25 09/27/2022 2221   BUN 9 09/27/2022 2221   BUN 10 09/11/2021 1224   CREATININE 0.70 09/27/2022 2221      Component Value Date/Time   CALCIUM 9.0 09/27/2022 2221   ALKPHOS 154 (H) 09/12/2021 2020   AST 20 09/12/2021 2020   ALT 14 09/12/2021 2020   BILITOT 0.3 09/12/2021 2020    BILITOT <0.2 09/11/2021 1224     Lab Results  Component Value Date   WBC 11.2 (H) 09/27/2022   HGB 13.9 09/27/2022   HCT 42.1 09/27/2022   MCV 91.5 09/27/2022   PLT 368 09/27/2022   Lab Results  Component Value Date   TSH 3.100 05/28/2022   Lab Results  Component Value Date   DDIMER <0.27 09/27/2022   ASSESSMENT AND PLAN:  Discussed the following assessment and plan:  #1 bipolar disorder with significant GAD.  Improving from a mood standpoint on Lamictal. Will continue 200 mg/day dosing.  Will now start sertraline to try to help with chronic anxiety.  She has tolerated 25 to 50 mg dose of this in the past.  Will take it slow since she is already on a decent dose  of trazodone for sleep. Start 25 mg nightly x 7 days and then increased to 50 mg nightly.  #2 insomnia, significantly improved.  Continue trazodone 2-4 tabs nightly.  3.  Palpitations, suspect due to anxiety. Observe. Will arrange outpatient monitoring if these return and persist. I do not think these are related to her Lamictal.  I discussed the assessment and treatment plan with the patient. The patient was provided an opportunity to ask questions and all were answered. The patient agreed with the plan and demonstrated an understanding of the instructions.   F/u: 2 weeks  Signed:  Santiago BumpersPhil Makynzee Tigges, MD           09/30/2022

## 2022-10-01 ENCOUNTER — Encounter: Payer: Self-pay | Admitting: Family Medicine

## 2022-10-03 ENCOUNTER — Encounter: Payer: Self-pay | Admitting: Obstetrics & Gynecology

## 2022-10-06 ENCOUNTER — Encounter: Payer: Self-pay | Admitting: Family Medicine

## 2022-10-07 NOTE — Telephone Encounter (Signed)
Yes, ibuprofen is fine.

## 2022-10-08 ENCOUNTER — Encounter: Payer: Self-pay | Admitting: Obstetrics & Gynecology

## 2022-10-13 ENCOUNTER — Encounter: Payer: Self-pay | Admitting: Family Medicine

## 2022-10-14 NOTE — Telephone Encounter (Signed)
Chelated blend is fine

## 2022-10-17 ENCOUNTER — Ambulatory Visit: Payer: 59 | Admitting: Behavioral Health

## 2022-10-17 NOTE — Progress Notes (Unsigned)
                Chidinma Clites L Adama Ivins, LMFT 

## 2022-10-20 ENCOUNTER — Encounter: Payer: Self-pay | Admitting: Obstetrics & Gynecology

## 2022-10-21 ENCOUNTER — Telehealth: Payer: 59 | Admitting: Family Medicine

## 2022-10-21 ENCOUNTER — Encounter: Payer: Self-pay | Admitting: Family Medicine

## 2022-10-21 DIAGNOSIS — F411 Generalized anxiety disorder: Secondary | ICD-10-CM

## 2022-10-21 DIAGNOSIS — F3178 Bipolar disorder, in full remission, most recent episode mixed: Secondary | ICD-10-CM | POA: Diagnosis not present

## 2022-10-21 NOTE — Progress Notes (Signed)
Virtual Visit via Video Note  I connected with Susan Chandler  on 10/21/22 at  1:00 PM EDT by a video enabled telemedicine application and verified that I am speaking with the correct person using two identifiers.  Location patient: [place] Location provider:work or home office Persons participating in the virtual visit: patient, provider  I discussed the limitations and requested verbal permission for telemedicine visit. The patient expressed understanding and agreed to proceed.  CC; 32 year old female being seen today for 3-week follow-up anxiety. A/P as of last visit: "#1 bipolar disorder with significant GAD.  Improving from a mood standpoint on Lamictal. Will continue 200 mg/day dosing.  Will now start sertraline to try to help with chronic anxiety.  She has tolerated 25 to 50 mg dose of this in the past.  Will take it slow since she is already on a decent dose of trazodone for sleep. Start 25 mg nightly x 7 days and then increased to 50 mg nightly.   #2 insomnia, significantly improved.  Continue trazodone 2-4 tabs nightly.   3.  Palpitations, suspect due to anxiety. Observe. Will arrange outpatient monitoring if these return and persist. I do not think these are related to her Lamictal."  INTERIM HX: She is happy to say she is doing very well. She is sleeping good, mood is good, anxiety level is manageable. She continued on only 25 mg of sertraline and did not go up to the 50 mg because she felt better already. No adverse side effects.  ROS: See pertinent positives and negatives per HPI.  Past Medical History:  Diagnosis Date   Abdominal pain    Persistent Mar/Apr 2020: w/u pretty unrevealing by me (Haitham Dolinsky); pt to see GI 10/21/18.   Abnormal uterine bleeding    Dr. Despina Hidden. Nuvaring as of 09/2019   Bipolar disorder (HCC)    r/o OCD per psychiatrist 03/2016.  Also, psychologist eval revealed suspected Bipolar II, depressed phase 04/16/2016.   Chronic pain syndrome    Dysmenorrhea     mgmt per Dr. Despina Hidden (01/2018).  Nuva Ring vag inserts 07/2018.   Erosive gastropathy 10/2018   Nonbleeding (EGD 10/2018)   GAD (generalized anxiety disorder)    Gestational diabetes    Infectious mononucleosis 2007   Lumbosacral radiculopathy at L5 2015   [facility]; then 2nd opinion with Dr. Shelle Iron at [facility], where she got ESI left L5-S1 11/2013 with moderate improvement.  Saw neurosurgeon 01/2016: surgery NOT recommended.   Severe recurrence 01/2021->rpt MRI w/out neural compression->plan for ESI L5-S1 left.   Migraine syndrome    Obesity, Class I, BMI 30-34.9    Pre-syncope 2016   ? POTS.  Holter showed mostly NSR, occ PACs, one run of nonsustained atrial tachy.   Pregnancy induced hypertension    Salmonella enteritis 09/2017   Scoliosis     Past Surgical History:  Procedure Laterality Date   BIOPSY  10/26/2018   Procedure: BIOPSY;  Surgeon: Malissa Hippo, MD;  [facility];  Service: Endoscopy;;   CESAREAN SECTION N/A 09/17/2021   Procedure: CESAREAN SECTION;  Surgeon: Venora Maples, MD;  [facility];  Service: Obstetrics;  Laterality: N/A;   ESOPHAGOGASTRODUODENOSCOPY  10/2018   Nonbleeding erosive gastropathy, H pylori NEG.   ESOPHAGOGASTRODUODENOSCOPY (EGD) WITH PROPOFOL N/A 10/26/2018   Procedure: ESOPHAGOGASTRODUODENOSCOPY (EGD) WITH PROPOFOL;  Surgeon: Malissa Hippo, MD;  [facility];  Service: Endoscopy;  Laterality: N/A;   WISDOM TOOTH EXTRACTION  Current Outpatient Medications:    etonogestrel-ethinyl estradiol (NUVARING) 0.12-0.015 MG/24HR vaginal ring, Insert vaginally and leave in place for 3 consecutive weeks, then remove for 1 week., Disp: 1 each, Rfl: 12   ibuprofen (ADVIL) 600 MG tablet, Take 1 tablet (600 mg total) by mouth every 6 (six) hours as needed., Disp: 30 tablet, Rfl: 0   lamoTRIgine (LAMICTAL) 200 MG tablet, Take 1 tablet (200 mg total) by mouth daily., Disp: 90 tablet, Rfl: 0    MAGNESIUM PO, Take by mouth at bedtime., Disp: , Rfl:    Multiple Vitamin (MULTI VITAMIN PO), Take by mouth., Disp: , Rfl:    sertraline (ZOLOFT) 25 MG tablet, 1 tab po qd x 7d then increase to 2 tabs qd, Disp: 53 tablet, Rfl: 0   traZODone (DESYREL) 50 MG tablet, 3 tabs po qhs prn insomnia, Disp: 90 tablet, Rfl: 1  EXAM:  VITALS per patient if applicable:      09/30/2022    2:10 PM 09/28/2022   12:00 AM 09/27/2022    9:40 PM  Vitals with BMI  Height 5\' 6"     Weight 220 lbs    BMI 35.53    Systolic  116 129  Diastolic  68 84  Pulse  68 84     GENERAL: alert, oriented, appears well and in no acute distress  HEENT: atraumatic, conjunttiva clear, no obvious abnormalities on inspection of external nose and ears  NECK: normal movements of the head and neck  LUNGS: on inspection no signs of respiratory distress, breathing rate appears normal, no obvious gross SOB, gasping or wheezing  CV: no obvious cyanosis  MS: moves all visible extremities without noticeable abnormality  PSYCH/NEURO: pleasant and cooperative, no obvious depression or anxiety, speech and thought processing grossly intact  LABS: none today    Chemistry      Component Value Date/Time   NA 138 09/27/2022 2221   NA 140 09/11/2021 1224   K 4.4 09/27/2022 2221   CL 105 09/27/2022 2221   CO2 25 09/27/2022 2221   BUN 9 09/27/2022 2221   BUN 10 09/11/2021 1224   CREATININE 0.70 09/27/2022 2221      Component Value Date/Time   CALCIUM 9.0 09/27/2022 2221   ALKPHOS 154 (H) 09/12/2021 2020   AST 20 09/12/2021 2020   ALT 14 09/12/2021 2020   BILITOT 0.3 09/12/2021 2020   BILITOT <0.2 09/11/2021 1224     ASSESSMENT AND PLAN:  Discussed the following assessment and plan:  Bipolar disorder with significant GAD.  Doing well from a mood standpoint on Lamictal. Will continue 200 mg/day dosing.  Sertraline 25 mg daily has helped well with her chronic anxiety.  If anxiety worsens over the next 6 weeks then she can  increase this to 50 mg daily.   I discussed the assessment and treatment plan with the patient. The patient was provided an opportunity to ask questions and all were answered. The patient agreed with the plan and demonstrated an understanding of the instructions.   F/u: 6 wks  Signed:  Santiago Bumpers, MD           10/21/2022

## 2022-10-22 ENCOUNTER — Encounter: Payer: Self-pay | Admitting: Family Medicine

## 2022-10-22 ENCOUNTER — Other Ambulatory Visit: Payer: Self-pay | Admitting: Family Medicine

## 2022-10-22 NOTE — Telephone Encounter (Signed)
Pt's last OV 10/21/22, next OV 6 weeks around 6/10. Current rx will last until 5/8, 30 d/s. Please advise if refill appropriate

## 2022-10-24 ENCOUNTER — Encounter: Payer: Self-pay | Admitting: Obstetrics & Gynecology

## 2022-10-29 ENCOUNTER — Encounter: Payer: Self-pay | Admitting: Obstetrics & Gynecology

## 2022-11-12 ENCOUNTER — Ambulatory Visit (INDEPENDENT_AMBULATORY_CARE_PROVIDER_SITE_OTHER): Payer: 59 | Admitting: Behavioral Health

## 2022-11-12 DIAGNOSIS — F3170 Bipolar disorder, currently in remission, most recent episode unspecified: Secondary | ICD-10-CM

## 2022-11-12 DIAGNOSIS — F411 Generalized anxiety disorder: Secondary | ICD-10-CM | POA: Diagnosis not present

## 2022-11-12 NOTE — Progress Notes (Signed)
                Susan Chandler L [name], LMFT 
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
## 2022-11-19 ENCOUNTER — Encounter: Payer: Self-pay | Admitting: Family Medicine

## 2022-11-21 ENCOUNTER — Other Ambulatory Visit: Payer: Self-pay | Admitting: Family Medicine

## 2022-11-21 ENCOUNTER — Encounter: Payer: Self-pay | Admitting: Family Medicine

## 2022-12-02 ENCOUNTER — Ambulatory Visit: Payer: 59 | Admitting: Behavioral Health

## 2022-12-02 DIAGNOSIS — F3170 Bipolar disorder, currently in remission, most recent episode unspecified: Secondary | ICD-10-CM

## 2022-12-02 DIAGNOSIS — F411 Generalized anxiety disorder: Secondary | ICD-10-CM | POA: Diagnosis not present
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
## 2022-12-02 NOTE — Progress Notes (Signed)
                Yandell Mcjunkins L Stori Royse, LMFT 

## 2022-12-05 ENCOUNTER — Telehealth: Payer: 59 | Admitting: Family Medicine

## 2022-12-05 ENCOUNTER — Encounter: Payer: Self-pay | Admitting: Family Medicine

## 2022-12-05 VITALS — Ht 66.0 in | Wt 203.0 lb

## 2022-12-05 DIAGNOSIS — F411 Generalized anxiety disorder: Secondary | ICD-10-CM

## 2022-12-05 DIAGNOSIS — F3178 Bipolar disorder, in full remission, most recent episode mixed: Secondary | ICD-10-CM

## 2022-12-05 NOTE — Progress Notes (Signed)
Virtual Visit via Video Note  I connected with Susan Chandler  on 12/05/22 at  3:20 PM EDT by a video enabled telemedicine application and verified that I am speaking with the correct person using two identifiers.  Location patient: [place] Location provider:work or home office Persons participating in the virtual visit: patient, provider  I discussed the limitations and requested verbal permission for telemedicine visit. The patient expressed understanding and agreed to proceed.  CC: 32 year old female being seen today for 6-week follow-up GAD and bipolar disorder. A/P as of last visit: "Bipolar disorder with significant GAD.  Doing well from a mood standpoint on Lamictal. Will continue 200 mg/day dosing.  Sertraline 25 mg daily has helped well with her chronic anxiety.  If anxiety worsens over the next 6 weeks then she can increase this to 50 mg daily."  INTERIM HX: Doing great. Enjoying things, energy level good, has purposefully lost some weight. She feels like her anxiety level is relatively low.  No panic attacks.  Seeing counselor now, had first in person visit recently and she is excited because she feels like it will be a good match.  ROS: See pertinent positives and negatives per HPI.  Past Medical History:  Diagnosis Date   Abdominal pain    Persistent Mar/Apr 2020: w/u pretty unrevealing by me (Abubakar Crispo); pt to see GI 10/21/18.   Abnormal uterine bleeding    Dr. Despina Hidden. Nuvaring as of 09/2019   Bipolar disorder (HCC)    r/o OCD per psychiatrist 03/2016.  Also, psychologist eval revealed suspected Bipolar II, depressed phase 04/16/2016.   Chronic pain syndrome    Dysmenorrhea    mgmt per Dr. Despina Hidden (01/2018).  Nuva Ring vag inserts 07/2018.   Erosive gastropathy 10/2018   Nonbleeding (EGD 10/2018)   GAD (generalized anxiety disorder)    Gestational diabetes    Infectious mononucleosis 2007   Lumbosacral radiculopathy at L5 2015   [facility]; then 2nd  opinion with Dr. Shelle Iron at [facility], where she got ESI left L5-S1 11/2013 with moderate improvement.  Saw neurosurgeon 01/2016: surgery NOT recommended.   Severe recurrence 01/2021->rpt MRI w/out neural compression->plan for ESI L5-S1 left.   Migraine syndrome    Obesity, Class I, BMI 30-34.9    Pre-syncope 2016   ? POTS.  Holter showed mostly NSR, occ PACs, one run of nonsustained atrial tachy.   Pregnancy induced hypertension    Salmonella enteritis 09/2017   Scoliosis     Past Surgical History:  Procedure Laterality Date   BIOPSY  10/26/2018   Procedure: BIOPSY;  Surgeon: Malissa Hippo, MD;  [facility];  Service: Endoscopy;;   CESAREAN SECTION N/A 09/17/2021   Procedure: CESAREAN SECTION;  Surgeon: Venora Maples, MD;  Location: [facility] LD [facility];  Service: Obstetrics;  Laterality: N/A;   ESOPHAGOGASTRODUODENOSCOPY  10/2018   Nonbleeding erosive gastropathy, H pylori NEG.   ESOPHAGOGASTRODUODENOSCOPY (EGD) WITH PROPOFOL N/A 10/26/2018   Procedure: ESOPHAGOGASTRODUODENOSCOPY (EGD) WITH PROPOFOL;  Surgeon: Malissa Hippo, MD;  [facility];  Service: Endoscopy;  Laterality: N/A;   WISDOM TOOTH EXTRACTION       Current Outpatient Medications:    etonogestrel-ethinyl estradiol (NUVARING) 0.12-0.015 MG/24HR vaginal ring, Insert vaginally and leave in place for 3 consecutive weeks, then remove for 1 week., Disp: 1 each, Rfl: 12   ibuprofen (ADVIL) 600 MG tablet, Take 1 tablet (600 mg total) by mouth every 6 (six) hours as needed., Disp: 30 tablet, Rfl: 0  lamoTRIgine (LAMICTAL) 200 MG tablet, Take 1 tablet (200 mg total) by mouth daily., Disp: 90 tablet, Rfl: 0   MAGNESIUM PO, Take by mouth at bedtime., Disp: , Rfl:    Multiple Vitamin (MULTI VITAMIN PO), Take by mouth., Disp: , Rfl:    sertraline (ZOLOFT) 25 MG tablet, 1 tab p.o. daily, Disp: 90 tablet, Rfl: 1   traZODone (DESYREL) 50 MG tablet, TAKE 3 TABLETS BY MOUTH AT BEDTIME AS NEEDED FOR INSOMNIA. OFFICE VISIT  NEEDED FOR FURTHER REFILLS, Disp: 90 tablet, Rfl: 0  EXAM:  VITALS per patient if applicable:  GENERAL: alert, oriented, appears well and in no acute distress  HEENT: atraumatic, conjunttiva clear, no obvious abnormalities on inspection of external nose and ears  NECK: normal movements of the head and neck  LUNGS: on inspection no signs of respiratory distress, breathing rate appears normal, no obvious gross SOB, gasping or wheezing  CV: no obvious cyanosis  MS: moves all visible extremities without noticeable abnormality  PSYCH/NEURO: pleasant and cooperative, no obvious depression or anxiety, speech and thought processing grossly intact  LABS: none today    Chemistry      Component Value Date/Time   NA 138 09/27/2022 2221   NA 140 09/11/2021 1224   K 4.4 09/27/2022 2221   CL 105 09/27/2022 2221   CO2 25 09/27/2022 2221   BUN 9 09/27/2022 2221   BUN 10 09/11/2021 1224   CREATININE 0.70 09/27/2022 2221      Component Value Date/Time   CALCIUM 9.0 09/27/2022 2221   ALKPHOS 154 (H) 09/12/2021 2020   AST 20 09/12/2021 2020   ALT 14 09/12/2021 2020   BILITOT 0.3 09/12/2021 2020   BILITOT <0.2 09/11/2021 1224     ASSESSMENT AND PLAN:  Discussed the following assessment and plan:  Bipolar disorder with significant GAD.  Doing excellent currently.  She wants to try to wean down medication a little bit.  I think it would make sense to go ahead and discontinue her sertraline--> take 125 mg tab every other day for 5 doses and then stop. Continue Lamictal 200 mg a day.  Continue trazodone 150 mg nightly. Continue with counseling.  I discussed the assessment and treatment plan with the patient. The patient was provided an opportunity to ask questions and all were answered. The patient agreed with the plan and demonstrated an understanding of the instructions.   F/u: 3 Months  Signed:  Santiago Bumpers, MD           12/05/2022

## 2022-12-13 ENCOUNTER — Other Ambulatory Visit: Payer: Self-pay | Admitting: Family Medicine

## 2022-12-15 ENCOUNTER — Other Ambulatory Visit: Payer: Self-pay | Admitting: Family Medicine

## 2022-12-15 ENCOUNTER — Encounter: Payer: Self-pay | Admitting: Family Medicine

## 2022-12-16 ENCOUNTER — Encounter: Payer: Self-pay | Admitting: Family Medicine

## 2022-12-16 MED ORDER — TRAZODONE HCL 50 MG PO TABS: ORAL_TABLET | ORAL | 1 refills | Status: DC

## 2022-12-16 MED ORDER — TRAZODONE HCL 100 MG PO TABS: ORAL_TABLET | ORAL | 3 refills | Status: DC

## 2022-12-16 NOTE — Telephone Encounter (Signed)
I just sent Rx for trazodone 100mg  tabs, take 2 at bedtime (I called the pharmacy and cancelled the 50mg  tabs). I sent Gwen a MyChart note. -thx

## 2022-12-16 NOTE — Telephone Encounter (Signed)
No further action needed.

## 2022-12-16 NOTE — Telephone Encounter (Signed)
Patient states that she takes 4 pills at night to help her sleep, she states Dr. Milinda Cave is aware of this. New prescription sent to pharmacy today but does not have new dosage for #4 pills.  Please verify with Dr. Milinda Cave if #4 is correct. If so, please resend new prescription so that pharmacy can get override with insurance with dosage change if applies.  Please call patient with update. [phone].

## 2022-12-16 NOTE — Telephone Encounter (Signed)
Patient 2nd message today 12/16/22  Patient states that she takes 4 pills at night to help her sleep, she states Dr. Milinda Cave is aware of this. New prescription sent to pharmacy today but does not have new dosage for #4 pills.  Please verify with Dr. Milinda Cave if #4 is correct. If so, please resend new prescription so that pharmacy can get override with insurance sith dosage change if applies.  Please call patient with update. [phone].

## 2022-12-17 NOTE — Telephone Encounter (Signed)
No further action needed.

## 2022-12-24 ENCOUNTER — Ambulatory Visit: Payer: 59 | Admitting: Behavioral Health

## 2022-12-26 ENCOUNTER — Encounter: Payer: Self-pay | Admitting: Family Medicine

## 2022-12-27 ENCOUNTER — Telehealth: Payer: Self-pay

## 2022-12-27 NOTE — Telephone Encounter (Signed)
Please advise. Pt is aware you are out of the office until next week.   Hi Dr. Milinda Cave Since I have stopped my Zoloft I have had what seems to be "zaps" that happen when I tend to move my eyes and/or head. This has just started since then. Could this be because of coming off the medicine? It's just out of the blue and kind of weird to be happening.

## 2022-12-30 NOTE — Telephone Encounter (Signed)
Yes, this may be due to stopping the zoloft but is not dangerous and should pass. Try to push through.

## 2022-12-30 NOTE — Telephone Encounter (Signed)
Pt advised of recommendations.  

## 2022-12-31 ENCOUNTER — Ambulatory Visit: Payer: 59 | Admitting: Behavioral Health

## 2022-12-31 DIAGNOSIS — F411 Generalized anxiety disorder: Secondary | ICD-10-CM | POA: Diagnosis not present

## 2022-12-31 DIAGNOSIS — F3170 Bipolar disorder, currently in remission, most recent episode unspecified: Secondary | ICD-10-CM | POA: Diagnosis not present

## 2022-12-31 NOTE — Progress Notes (Signed)
                Aayan Haskew L Derrion Tritz, LMFT 

## 2022-12-31 NOTE — Progress Notes (Addendum)
[facility] Counselor/Therapist Progress Note  Patient ID: NIKIA LEVELS, MRN: [id number],    Date: 12/31/2022  Time Spent: 55 min Caregility video; Pt is home in private & Provider working remotely from [facility]. Pt understands the risks/limitations of telementalhealth Therapy & consents.   Time In:11:00am Time Out: 11:55am  Treatment Type: Individual Therapy  Reported Symptoms: Elevated anx/dep, stress & grief due to Family situation w/death of Str in 01-07-2020. This has changed Family dynamics w/Pt's Son Barrett & Str Cassie's Son Harriette Bouillon since the adjustment.   Mental Status Exam: Appearance:  Casual     Behavior: Appropriate and Sharing  Motor: Normal  Speech/Language:  Clear and Coherent  Affect: Appropriate  Mood: sad and tearful  Thought process: normal  Thought content:   WNL  Sensory/Perceptual disturbances:   WNL  Orientation: oriented to person, place, time/date, and situation  Attention: Good  Concentration: Good  Memory: WNL  Fund of knowledge:  Good  Insight:   Good  Judgment:  Good  Impulse Control: Good   Risk Assessment: Danger to Self:  No Self-injurious Behavior: No Danger to Others: No Duty to Warn:no Physical Aggression / Violence:No  Access to Firearms a concern: No  Gang Involvement:No   Subjective: Pt is upset due to Family dynamics in the home. She is being confronted w/being "the negativity in the house". She has been labelled by her Mother due to bad argument in March 2024 as a Narcicist, selfish, & exp'g a Psychotic Break.   Interventions: Family Systems  Diagnosis:Bipolar disorder in partial remission, most recent episode unspecified type (HCC)  Generalized anxiety disorder  Plan: Ikeisha is upset over recent Family dynamics & Tx of her Son Barrett who is 1 1/2 yo this 09-Jun-2023. The death of her Str Cassie in 2020-01-07 has highly impacted the environment. Kattia is finding it challenging to regulate her emotions when she feels  the target of Family's criticism. Sibbie will use the resources mailed to her today to educate herself on Highly Sensitive People/The Gift of Empathy & the work of Dr. Judithann Sauger, MD. We will discuss her emot'l regulation skills & dvlp new ones & also define her sense of self & improve confidence.   Target Date: 01/21/2023  Progress: 3  Frequency: Twice monthly  Modality: [name], LMFT

## 2023-01-07 ENCOUNTER — Encounter: Payer: Self-pay | Admitting: Family Medicine

## 2023-01-07 ENCOUNTER — Other Ambulatory Visit: Payer: Self-pay | Admitting: Family Medicine

## 2023-01-08 NOTE — Telephone Encounter (Signed)
Why was this prescription refused?

## 2023-01-09 NOTE — Telephone Encounter (Signed)
Okay; thanks.

## 2023-01-10 ENCOUNTER — Encounter: Payer: Self-pay | Admitting: Family Medicine

## 2023-01-10 NOTE — Telephone Encounter (Signed)
Pt advised.

## 2023-01-10 NOTE — Telephone Encounter (Signed)
Yes this is ok 

## 2023-01-16 ENCOUNTER — Ambulatory Visit: Payer: 59 | Admitting: Behavioral Health

## 2023-01-16 DIAGNOSIS — F3170 Bipolar disorder, currently in remission, most recent episode unspecified: Secondary | ICD-10-CM

## 2023-01-16 DIAGNOSIS — F411 Generalized anxiety disorder: Secondary | ICD-10-CM | POA: Diagnosis not present

## 2023-01-16 NOTE — Progress Notes (Signed)
[facility] Counselor/Therapist Progress Note  Patient ID: Susan Chandler, MRN: [id number],    Date: 01/16/2023  Time Spent: 55 min Caregility video; Pt in basement of home in private & Provider remote from [facility]. Pt is aware of the risks/limitations & consents to Tx today.    Treatment Type: Individual Therapy  Reported Symptoms: Reduction in emot'l swings & managing it better btwn intensity of episodes. Pt still exp'g anx/dep elevation.   Mental Status Exam: Appearance:  Casual     Behavior: Appropriate and Sharing  Motor: Normal  Speech/Language:  Clear and Coherent and Normal Rate  Affect: Appropriate  Mood: normal  Thought process: normal  Thought content:   WNL  Sensory/Perceptual disturbances:   WNL  Orientation: oriented to person, place, time/date, and situation  Attention: Good  Concentration: Good  Memory: WNL  Fund of knowledge:  Good  Insight:   Fair  Judgment:  Fair  Impulse Control: Fair   Risk Assessment: Danger to Self:  No Self-injurious Behavior: No Danger to Others: No Duty to Warn:no Physical Aggression / Violence:No  Access to Firearms a concern: No  Gang Involvement:No   Subjective: Pt is concerned for her emotional lability. She relates stories of her Family's focus on her emotionality & how they emphasize this to her often.    Interventions: Family Systems & emot'l reg skills  Diagnosis:Bipolar disorder in partial remission, most recent episode unspecified type (HCC)  Generalized anxiety disorder  Plan: Susan Chandler will work on the emot'l reg skills discussed today. She will track her progress in her Notebook. We will have Husb Susan Chandler join [place] next session.  Target Date: 02/21/2023  Progress: 5  Frequency: Once every 3-4 wks  Modality: [name], LMFT

## 2023-01-16 NOTE — Progress Notes (Signed)
                Victoria L Winstead, LMFT 

## 2023-01-30 ENCOUNTER — Encounter: Payer: Self-pay | Admitting: Family Medicine

## 2023-01-31 ENCOUNTER — Other Ambulatory Visit: Payer: Self-pay | Admitting: Family Medicine

## 2023-02-04 ENCOUNTER — Ambulatory Visit: Payer: 59 | Admitting: Behavioral Health

## 2023-02-04 DIAGNOSIS — F3170 Bipolar disorder, currently in remission, most recent episode unspecified: Secondary | ICD-10-CM

## 2023-02-04 DIAGNOSIS — F411 Generalized anxiety disorder: Secondary | ICD-10-CM

## 2023-02-04 NOTE — Progress Notes (Signed)
                Victoria L Winstead, LMFT 

## 2023-02-05 NOTE — Patient Instructions (Signed)
 It was very nice to see you today!   PLEASE NOTE:    Please give ample time to the testing facility, and our office to run, receive and review results. Please do not call inquiring of results, even if you can see them in your chart. We will contact you as soon as we are able. If it has been over 1 week since the test was completed, and you have not yet heard from [place], then please call us.   If we ordered any referrals today, please let us know if you have not heard from their office within the next 2 weeks. You should receive a letter via MyChart confirming if the referral was approved and their office contact information to schedule.

## 2023-02-06 ENCOUNTER — Encounter (INDEPENDENT_AMBULATORY_CARE_PROVIDER_SITE_OTHER): Payer: Self-pay

## 2023-02-06 ENCOUNTER — Encounter: Payer: Self-pay | Admitting: Family Medicine

## 2023-02-06 ENCOUNTER — Ambulatory Visit: Payer: 59 | Admitting: Family Medicine

## 2023-02-06 VITALS — BP 104/74 | HR 97 | Temp 99.2°F | Ht 66.0 in | Wt 202.4 lb

## 2023-02-06 DIAGNOSIS — F99 Mental disorder, not otherwise specified: Secondary | ICD-10-CM

## 2023-02-06 DIAGNOSIS — N6011 Diffuse cystic mastopathy of right breast: Secondary | ICD-10-CM | POA: Diagnosis not present

## 2023-02-06 DIAGNOSIS — F4521 Hypochondriasis: Secondary | ICD-10-CM | POA: Diagnosis not present

## 2023-02-06 DIAGNOSIS — F5105 Insomnia due to other mental disorder: Secondary | ICD-10-CM

## 2023-02-06 DIAGNOSIS — N6012 Diffuse cystic mastopathy of left breast: Secondary | ICD-10-CM

## 2023-02-06 DIAGNOSIS — F411 Generalized anxiety disorder: Secondary | ICD-10-CM

## 2023-02-06 DIAGNOSIS — F3181 Bipolar II disorder: Secondary | ICD-10-CM

## 2023-02-06 NOTE — Progress Notes (Signed)
OFFICE VISIT  02/06/2023  CC: f/u bipolar/anxiety, breast concern  Patient is a 32 y.o. female who presents for 66-month follow-up bipolar disorder and GAD. A/P as of last visit: "Bipolar disorder with significant GAD.  Doing excellent currently.  She wants to try to wean down medication a little bit.  I think it would make sense to go ahead and discontinue her sertraline--> take 125 mg tab every other day for 5 doses and then stop. Continue Lamictal 200 mg a day.  Continue trazodone 150 mg nightly. Continue with counseling."  INTERIM HX: Lately she is obsessed much more with thoughts about guilt, esp the way she hyperfocuses on her body--->hypochondriasis/catastrophic thinking.  Always anxious.  Says she is discussing this with her counselor. Most of what she worries about lately is breast lump fears.  Has seen GYN x 2 for this, reassured both times she has only fibrocystic changes but she can't be reassured. Denies SI or HI. She is taking lamictal 200 mg every day and trazodone 200mg  at bedtime but is fearlull of taking other meds.  Past Medical History:  Diagnosis Date   Abdominal pain    Persistent Mar/Apr 2020: w/u pretty unrevealing by me (Savayah Waltrip); pt to see GI 10/21/18.   Abnormal uterine bleeding    Dr. Despina Hidden. Nuvaring as of 09/2019   Bipolar disorder (HCC)    r/o OCD per psychiatrist 03/2016.  Also, psychologist eval revealed suspected Bipolar II, depressed phase 04/16/2016.   Chronic pain syndrome    Dysmenorrhea    mgmt per Dr. Despina Hidden (01/2018).  Nuva Ring vag inserts 07/2018.   Erosive gastropathy 10/2018   Nonbleeding (EGD 10/2018)   GAD (generalized anxiety disorder)    Gestational diabetes    Infectious mononucleosis 2007   Lumbosacral radiculopathy at L5 2015   [facility]; then 2nd opinion with Dr. Shelle Iron at [facility], where she got ESI left L5-S1 11/2013 with moderate improvement.  Saw neurosurgeon 01/2016: surgery NOT recommended.   Severe  recurrence 01/2021->rpt MRI w/out neural compression->plan for ESI L5-S1 left.   Migraine syndrome    Obesity, Class I, BMI 30-34.9    Pre-syncope 2016   ? POTS.  Holter showed mostly NSR, occ PACs, one run of nonsustained atrial tachy.   Pregnancy induced hypertension    Salmonella enteritis 09/2017   Scoliosis     Past Surgical History:  Procedure Laterality Date   BIOPSY  10/26/2018   Procedure: BIOPSY;  Surgeon: Malissa Hippo, MD;  [facility];  Service: Endoscopy;;   CESAREAN SECTION N/A 09/17/2021   Procedure: CESAREAN SECTION;  Surgeon: Venora Maples, MD;  [facility]: [facility];  Service: Obstetrics;  Laterality: N/A;   ESOPHAGOGASTRODUODENOSCOPY  10/2018   Nonbleeding erosive gastropathy, H pylori NEG.   ESOPHAGOGASTRODUODENOSCOPY (EGD) WITH PROPOFOL N/A 10/26/2018   Procedure: ESOPHAGOGASTRODUODENOSCOPY (EGD) WITH PROPOFOL;  Surgeon: Malissa Hippo, MD;  [facility];  Service: Endoscopy;  Laterality: N/A;   WISDOM TOOTH EXTRACTION      Outpatient Medications Prior to Visit  Medication Sig Dispense Refill   ibuprofen (ADVIL) 600 MG tablet Take 1 tablet (600 mg total) by mouth every 6 (six) hours as needed. 30 tablet 0   lamoTRIgine (LAMICTAL) 200 MG tablet TAKE 1 TABLET BY MOUTH EVERY DAY 90 tablet 0   MAGNESIUM PO Take by mouth at bedtime.     Multiple Vitamin (MULTI VITAMIN PO) Take by mouth.     traZODone (DESYREL) 100  MG tablet 2 tabs po at bedtime 60 tablet 3   etonogestrel-ethinyl estradiol (NUVARING) 0.12-0.015 MG/24HR vaginal ring Insert vaginally and leave in place for 3 consecutive weeks, then remove for 1 week. (Patient not taking: Reported on 02/06/2023) 1 each 12   sertraline (ZOLOFT) 25 MG tablet 1 tab p.o. daily (Patient not taking: Reported on 02/06/2023) 90 tablet 1   No facility-administered medications prior to visit.    Allergies  Allergen Reactions   Shellfish Allergy Nausea And Vomiting    Review of Systems As per  HPI  PE:    02/06/2023    2:01 PM 12/05/2022    2:58 PM 09/30/2022    2:10 PM  Vitals with BMI  Height 5\' 6"  5\' 6"  5\' 6"   Weight 202 lbs 6 oz 203 lbs 220 lbs  BMI 32.68 32.78 35.53  Systolic 104    Diastolic 74    Pulse 97       Physical Exam  Exam chaperoned by Sammuel Cooper, CMA Examined upper regions of both breasts and both axilla. Some small areas of ropy glandular breast tissue is palpable c/w fibrocystic/benign changes. Axillae normal.  LABS:  Last CBC Lab Results  Component Value Date   WBC 11.2 (H) 09/27/2022   HGB 13.9 09/27/2022   HCT 42.1 09/27/2022   MCV 91.5 09/27/2022   MCH 30.2 09/27/2022   RDW 13.1 09/27/2022   PLT 368 09/27/2022   Last metabolic panel Lab Results  Component Value Date   GLUCOSE 97 09/27/2022   NA 138 09/27/2022   K 4.4 09/27/2022   CL 105 09/27/2022   CO2 25 09/27/2022   BUN 9 09/27/2022   CREATININE 0.70 09/27/2022   GFRNONAA >60 09/27/2022   CALCIUM 9.0 09/27/2022   PROT 5.7 (L) 09/12/2021   ALBUMIN 2.4 (L) 09/12/2021   LABGLOB 2.8 09/11/2021   AGRATIO 1.3 09/11/2021   BILITOT 0.3 09/12/2021   ALKPHOS 154 (H) 09/12/2021   AST 20 09/12/2021   ALT 14 09/12/2021   ANIONGAP 8 09/27/2022   Last thyroid functions Lab Results  Component Value Date   TSH 3.100 05/28/2022   IMPRESSION AND PLAN:  1) Bipolar d/o, partial remission/stable. Cont lamictal 276m every day.  2) GAD+ hypochondriasis-->reassurance given, support/empathy. Continue with counseling.  3) insomnia, doing well on trazodone 200mg  at bedtime.  4) fibrocystic breast changes. Reassured.  An After Visit Summary was printed and given to the patient.  FOLLOW UP: Return in about 2 weeks (around 02/20/2023) for f/u mood/anx.  Signed:  Santiago Bumpers, MD           02/06/2023

## 2023-02-10 ENCOUNTER — Encounter: Payer: Self-pay | Admitting: Family Medicine

## 2023-02-10 MED ORDER — LINACLOTIDE 290 MCG PO CAPS: ORAL_CAPSULE | ORAL | 1 refills | Status: DC

## 2023-02-10 NOTE — Telephone Encounter (Signed)
Linzess prescription sent

## 2023-02-12 ENCOUNTER — Encounter: Payer: Self-pay | Admitting: Family Medicine

## 2023-02-18 ENCOUNTER — Encounter: Payer: Self-pay | Admitting: Family Medicine

## 2023-02-19 ENCOUNTER — Ambulatory Visit: Payer: 59 | Admitting: Behavioral Health

## 2023-02-19 DIAGNOSIS — F3170 Bipolar disorder, currently in remission, most recent episode unspecified: Secondary | ICD-10-CM

## 2023-02-19 DIAGNOSIS — F411 Generalized anxiety disorder: Secondary | ICD-10-CM

## 2023-02-19 NOTE — Progress Notes (Signed)
                Victoria L Winstead, LMFT 

## 2023-02-20 NOTE — Progress Notes (Signed)
[facility] Counselor/Therapist Progress Note  Patient ID: MCKAY NOTZ, MRN: [id number],    Date: 02/20/2023  Time Spent: 55 min In Person @ [facility] Office   Treatment Type:  Cpl Th  Reported Symptoms: Elevated anx/dep, stress & emot'l dysregulation w/in the Family home setting since death of her Sister & loss of BIL to suicide in the past 2 yrs.   Mental Status Exam: Appearance:  Casual and Neat     Behavior: Appropriate and Sharing  Motor: Normal  Speech/Language:  Clear and Coherent  Affect: Appropriate  Mood: anxious & labile  Thought process: Pt defers to repetitive stories  Thought content:   Rumination  Sensory/Perceptual disturbances:   WNL  Orientation: oriented to person, place, time/date, and situation  Attention: Good  Concentration: Good  Memory: WNL  Fund of knowledge:  Good  Insight:   Fair  Judgment:  Fair  Impulse Control: Fair   Risk Assessment: Danger to Self:  No Self-injurious Behavior: No Danger to Others: No Duty to Warn:no Physical Aggression / Violence:No  Access to Firearms a concern: No  Gang Involvement:No   Subjective: Pt c/o her Family dynamics in the home setting where everyone "picks on me & blames me" making me feel outcast & insensitive. Pt's Husb disagrees w/her perceptions of the Family c/o her constant habit of seeing things in this manner when he feels Family is not singling her out. She has 32yo [place], extreme health anxiety, & needs her Mother's approval. Husb is exasperated today in session.    Interventions: Family Systems and Cpl Th  Diagnosis:Bipolar disorder in partial remission, most recent episode unspecified type (HCC)  Generalized anxiety disorder  Plan: Shnannon & her Husb Deniece Portela have been provided w/psychoedu re: bipolar d/o & how it manifests in order to understand her beh & thinking patterns better. Provided 2 resources for Pt edu today: [email or web address] for emot'l regulation  skill practice & [place]'s Wheel of Emotion to use when she feels elevated anx & emot. She & Deniece Portela will both cooperate w/ea other to use these tools & report back on their progress.  Target Date: 02/23/2023  Progress: 3  Frequency: Once every 2-3 wks  Modality: Cpl Th  Deneise Lever, LMFT

## 2023-02-21 ENCOUNTER — Encounter: Payer: Self-pay | Admitting: Family Medicine

## 2023-02-21 NOTE — Progress Notes (Signed)
[facility] Counselor/Therapist Progress Note  Patient ID: ANGELIC PETER, MRN: [id number],    Date: 02/21/2023  Time Spent: 55 min In Person @ [facility] Office Time In: 4:00pm Time Out: 4:55pm   Treatment Type:  Cpl Th  Reported Symptoms: Elevated anx/dep & stress with high emot'l lability during session w/[facility] [place].   Mental Status Exam: Appearance:  Casual     Behavior: Appropriate, Sharing, and Minimizing  Motor: Normal  Speech/Language:  Clear and Coherent  Affect: Labile and Tearful  Mood: anxious, depressed, and labile  Thought process: normal  Thought content:   Pt cannot distance herself from the unhealthy preoccupations she is exp'g regarding the Family's opinion of her as negative  Sensory/Perceptual disturbances:   WNL  Orientation: oriented to person, place, time/date, and situation  Attention: Good  Concentration: Good  Memory: WNL  Fund of knowledge:  Fair  Insight:   Poor  Judgment:  Fair  Impulse Control: Fair   Risk Assessment: Danger to Self:  No Self-injurious Behavior: No Danger to Others: No Duty to Warn:no Physical Aggression / Violence:No  Access to Firearms a concern: No  Gang Involvement:No   Subjective: Pt is recounting how Family seems reluctant to assist her w/her Son & his needs. The family has exp'd the death of a Str & Pt's BIL w/in the last 2 yrs. It has prevented Family from fully grieving. The dynamics revolve around making the Pt a scape goat. She does not enjoy this role & often is angered by this. Her rxns are used pejoratively by Family members to put her down.    Pt is trying to deal w/her levels of anxiety but has not been successful. Husb disagrees w/her perceptions & often justifies Family interactions. This makes Pt feel worse. This vicious cycle is inc'g her anxiety.   Emphasized to Pt the need for medication to regulate her emot'l distress until she can learn more skills to cope effectively. Husb agreed & Pt  is not convinced. Expressed to Pt she cannot work on these difficult issues w/out medical intervention @ this time.   Interventions: Family Systems, Cpl communication skills, support & validation for Pt perceptions  Diagnosis:Bipolar disorder in partial remission, most recent episode unspecified type (HCC)  Generalized anxiety disorder  Plan: Krystalynn will use the psychoedu provided today re: Families & Grief, Bipolar d/o & its presentataion & manifestation in times of stress to promote improved interactions w/her Family.   Pt will call the Referral provided previously & today to secure an appt w/a Psychiatrist who can manage her medications. Pt agreed.  Target Date: 03/24/2023  Progress: 3  Frequency: Once every 2-3 wks  Modality: Cpl Th   Deneise Lever, LMFT

## 2023-02-25 NOTE — Telephone Encounter (Signed)
No further action needed.

## 2023-02-25 NOTE — Telephone Encounter (Signed)
Yes okay to use just as needed

## 2023-02-27 ENCOUNTER — Ambulatory Visit: Payer: 59 | Admitting: Family Medicine

## 2023-02-28 ENCOUNTER — Encounter: Payer: Self-pay | Admitting: Family Medicine

## 2023-02-28 ENCOUNTER — Telehealth: Payer: 59 | Admitting: Family Medicine

## 2023-02-28 DIAGNOSIS — F4521 Hypochondriasis: Secondary | ICD-10-CM | POA: Diagnosis not present

## 2023-02-28 DIAGNOSIS — F3178 Bipolar disorder, in full remission, most recent episode mixed: Secondary | ICD-10-CM

## 2023-02-28 DIAGNOSIS — F411 Generalized anxiety disorder: Secondary | ICD-10-CM

## 2023-02-28 NOTE — Progress Notes (Unsigned)
Virtual Visit via Video Note  I connected with Susan Chandler  on 02/28/23 at  3:20 PM EDT by a video enabled telemedicine application and verified that I am speaking with the correct person using two identifiers.  Location patient: [place] Location provider:work or home office Persons participating in the virtual visit: patient, provider  I discussed the limitations and requested verbal permission for telemedicine visit. The patient expressed understanding and agreed to proceed.  CC: f/u anx A/P as of last visit: "1) Bipolar d/o, partial remission/stable. Cont lamictal 245m every day.   2) GAD+ hypochondriasis-->reassurance given, support/empathy. Continue with counseling.   3) insomnia, doing well on trazodone 200mg  at bedtime.   4) fibrocystic breast changes. Reassured."  HPI: 32 year old female being seen for 3-week follow-up bipolar disorder, anxiety, insomnia, and hypochondritis. Stable. Trying to keep herself occupied to distract herself from her tendency to [facility] about everything physically. No depressed mood or hypomanic behavior. Has enjoyed time with her husband and her son Barrett this week, working on Training and development officer.  ROS: See pertinent positives and negatives per HPI.  Past Medical History:  Diagnosis Date   Abdominal pain    Persistent Mar/Apr 2020: w/u pretty unrevealing by me (Tiajuana Leppanen); pt to see GI 10/21/18.   Abnormal uterine bleeding    Dr. Despina Hidden. Nuvaring as of 09/2019   Bipolar disorder (HCC)    r/o OCD per psychiatrist 03/2016.  Also, psychologist eval revealed suspected Bipolar II, depressed phase 04/16/2016.   Chronic pain syndrome    Dysmenorrhea    mgmt per Dr. Despina Hidden (01/2018).  Nuva Ring vag inserts 07/2018.   Erosive gastropathy 10/2018   Nonbleeding (EGD 10/2018)   GAD (generalized anxiety disorder)    Gestational diabetes    Infectious mononucleosis 2007   Lumbosacral radiculopathy at L5 2015   [facility]; then 2nd  opinion with Dr. Shelle Iron at [facility], where she got ESI left L5-S1 11/2013 with moderate improvement.  Saw neurosurgeon 01/2016: surgery NOT recommended.   Severe recurrence 01/2021->rpt MRI w/out neural compression->plan for ESI L5-S1 left.   Migraine syndrome    Obesity, Class I, BMI 30-34.9    Pre-syncope 2016   ? POTS.  Holter showed mostly NSR, occ PACs, one run of nonsustained atrial tachy.   Pregnancy induced hypertension    Salmonella enteritis 09/2017   Scoliosis     Past Surgical History:  Procedure Laterality Date   BIOPSY  10/26/2018   Procedure: BIOPSY;  Surgeon: Malissa Hippo, MD;  [facility];  Service: Endoscopy;;   CESAREAN SECTION N/A 09/17/2021   Procedure: CESAREAN SECTION;  Surgeon: Venora Maples, MD;  Location: [facility];  Service: Obstetrics;  Laterality: N/A;   ESOPHAGOGASTRODUODENOSCOPY  10/2018   Nonbleeding erosive gastropathy, H pylori NEG.   ESOPHAGOGASTRODUODENOSCOPY (EGD) WITH PROPOFOL N/A 10/26/2018   Procedure: ESOPHAGOGASTRODUODENOSCOPY (EGD) WITH PROPOFOL;  Surgeon: Malissa Hippo, MD;  [facility];  Service: Endoscopy;  Laterality: N/A;   WISDOM TOOTH EXTRACTION       Current Outpatient Medications:    BIOTIN PO, Take by mouth daily., Disp: , Rfl:    Flaxseed, Linseed, (FLAXSEED OIL PO), Take by mouth., Disp: , Rfl:    lamoTRIgine (LAMICTAL) 200 MG tablet, TAKE 1 TABLET BY MOUTH EVERY DAY, Disp: 90 tablet, Rfl: 0   MAGNESIUM PO, Take by mouth at bedtime., Disp: , Rfl:    traZODone (DESYREL) 100 MG tablet, 2 tabs po at bedtime, Disp: 60 tablet, Rfl:  3   ibuprofen (ADVIL) 600 MG tablet, Take 1 tablet (600 mg total) by mouth every 6 (six) hours as needed. (Patient not taking: Reported on 02/28/2023), Disp: 30 tablet, Rfl: 0   linaclotide (LINZESS) 290 MCG CAPS capsule, 1 cap po qd (Patient not taking: Reported on 02/28/2023), Disp: 30 capsule, Rfl: 1   Multiple Vitamin (MULTI VITAMIN PO), Take by mouth. (Patient not taking:  Reported on 02/28/2023), Disp: , Rfl:   EXAM:  VITALS per patient if applicable:     02/06/2023    2:01 PM 12/05/2022    2:58 PM 09/30/2022    2:10 PM  Vitals with BMI  Height 5\' 6"  5\' 6"  5\' 6"   Weight 202 lbs 6 oz 203 lbs 220 lbs  BMI 32.68 32.78 35.53  Systolic 104    Diastolic 74    Pulse 97       GENERAL: alert, oriented, appears well and in no acute distress  HEENT: atraumatic, conjunttiva clear, no obvious abnormalities on inspection of external nose and ears  NECK: normal movements of the head and neck  LUNGS: on inspection no signs of respiratory distress, breathing rate appears normal, no obvious gross SOB, gasping or wheezing  CV: no obvious cyanosis  MS: moves all visible extremities without noticeable abnormality  PSYCH/NEURO: pleasant and cooperative, no obvious depression or anxiety, speech and thought processing grossly intact  LABS: none today  ASSESSMENT AND PLAN:  Discussed the following assessment and plan:  1) Bipolar d/o, in remission currently. Cont lamictal 279m every day. In jan 2025 it will be 1 yr on med.  As long as she's still stable at that time she would like to attempt ween off med. We will proceed cautiously.  2) GAD+ hypochondriasis-->reassurance given, support/empathy. Continue with counseling.   3) insomnia, doing well on trazodone 200mg  at bedtime.   I discussed the assessment and treatment plan with the patient. The patient was provided an opportunity to ask questions and all were answered. The patient agreed with the plan and demonstrated an understanding of the instructions.   F/u: 2 mo  Signed:  Santiago Bumpers, MD           02/28/2023

## 2023-03-01 ENCOUNTER — Encounter: Payer: Self-pay | Admitting: Family Medicine

## 2023-03-05 ENCOUNTER — Ambulatory Visit: Payer: 59 | Admitting: Family Medicine

## 2023-03-25 ENCOUNTER — Ambulatory Visit: Payer: 59 | Admitting: Behavioral Health

## 2023-03-25 ENCOUNTER — Encounter: Payer: Self-pay | Admitting: Family Medicine

## 2023-03-25 DIAGNOSIS — F3170 Bipolar disorder, currently in remission, most recent episode unspecified: Secondary | ICD-10-CM | POA: Diagnosis not present

## 2023-03-25 DIAGNOSIS — F411 Generalized anxiety disorder: Secondary | ICD-10-CM | POA: Diagnosis not present

## 2023-03-25 NOTE — Progress Notes (Signed)
                Susan Chandler Susan Linares, LMFT 

## 2023-03-25 NOTE — Progress Notes (Signed)
[facility] Counselor/Therapist Progress Note  Patient ID: Susan Chandler, MRN: [id number],    Date: 03/25/2023  Time Spent: 55 min Caregility video; Pt is home in private & Provider working remotely from [facility] Office. Pt is aware of the risks/limitations of telehealth & consents to Tx today. Time In: 3:00pm Time Out: 3:55pm  Treatment Type: Individual Therapy  Reported Symptoms: Elevated anx/dep & stress with worries about using medications that might be "poison to my body" long term. She is worried less & reassured about her breasts.   Mental Status Exam: Appearance:  Casual     Behavior: Appropriate and Sharing  Motor: Normal  Speech/Language:  Clear and Coherent  Affect: Appropriate  Mood: normal  Thought process: normal  Thought content:   WNL  Sensory/Perceptual disturbances:   WNL  Orientation: oriented to person, place, time/date, and situation  Attention: Good  Concentration: Good  Memory: WNL  Fund of knowledge:  Good  Insight:   Good  Judgment:  Good  Impulse Control: Good   Risk Assessment: Danger to Self:  No Self-injurious Behavior: No Danger to Others: No Duty to Warn:no Physical Aggression / Violence:No  Access to Firearms a concern: No  Gang Involvement:No   Subjective: Pt is upbeat today & has positive goals for her medication & Tx today. She & Dr. Milinda Chandler have set a goal for Jan discontinuation of Lamictal. She also wants to wean off Trazodone.   Interventions:  Family Th, new goals for psychopharmacology, & psychoedu  Diagnosis:Bipolar disorder in partial remission, most recent episode unspecified type (HCC)  Generalized anxiety disorder  Plan: Susan Chandler will be firm w/her Family about the incident in March that got out of proportion. She will remind herself of the situation & tell Family, the incident is under the bridge & she will refuse to speak about it.  Susan Chandler ruminates on health issues. She will contact Susan Chandler to see  if she can secure an appt to address her hyper focus/hyper vigilance habits. We will proceed from this point.  Susan Chandler will try the suggestions provided today to regulate her emotions when around other Family & befriend her worrisome feelings & then let them go.  Target Date: 04/23/2023  Progress: 4  Frequency: Once every 2-3 wks  Modality: Susan Fraise, LMFT

## 2023-04-03 ENCOUNTER — Other Ambulatory Visit: Payer: Self-pay | Admitting: Family Medicine

## 2023-04-15 ENCOUNTER — Other Ambulatory Visit: Payer: Self-pay | Admitting: Family Medicine

## 2023-04-16 ENCOUNTER — Ambulatory Visit: Payer: 59 | Admitting: Behavioral Health

## 2023-04-20 ENCOUNTER — Encounter: Payer: Self-pay | Admitting: Family Medicine

## 2023-04-21 ENCOUNTER — Ambulatory Visit: Payer: 59 | Admitting: Behavioral Health

## 2023-04-21 ENCOUNTER — Encounter: Payer: Self-pay | Admitting: Family Medicine

## 2023-04-21 ENCOUNTER — Telehealth: Payer: 59 | Admitting: Family Medicine

## 2023-04-21 VITALS — Wt 202.0 lb

## 2023-04-21 DIAGNOSIS — F5105 Insomnia due to other mental disorder: Secondary | ICD-10-CM | POA: Diagnosis not present

## 2023-04-21 DIAGNOSIS — F3181 Bipolar II disorder: Secondary | ICD-10-CM

## 2023-04-21 DIAGNOSIS — F4521 Hypochondriasis: Secondary | ICD-10-CM

## 2023-04-21 MED ORDER — LAMOTRIGINE 200 MG PO TABS: 200.0000 mg | ORAL_TABLET | Freq: Every day | ORAL | 1 refills | Status: DC

## 2023-04-21 NOTE — Progress Notes (Signed)
Virtual Visit via Video Note  I connected with Susan Chandler  on 04/21/23 at  4:00 PM EDT by a video enabled telemedicine application and verified that I am speaking with the correct person using two identifiers.  Location patient: [facility] Location provider:work or home office Persons participating in the virtual visit: patient, provider  I discussed the limitations and requested verbal permission for telemedicine visit. The patient expressed understanding and agreed to proceed.  CC: 32 y/o female being seen today for 7 wk f/u anx/dep, insomnia, hypochondriasis. A/P as of last visit: "1) Bipolar d/o, in remission currently. Cont lamictal 264m every day. In jan 2025 it will be 1 yr on med.  As long as she's still stable at that time she would like to attempt ween off med. We will proceed cautiously.   2) GAD+ hypochondriasis-->reassurance given, support/empathy. Continue with counseling.   3) insomnia, doing well on trazodone 200mg  at bedtime."  INTERIM HX: Unfortunately she has been out of her Lamictal for the last few days. She does definitely felt more labile mood during this time. Her biggest problem still is constant worry about having a bad disease. In particular, she worries that her fibrocystic breast disease with might be cancer.  Additionally, she has a mole on the left upper chest area and it changed color during her pregnancy.  She was reassured by her GYN that it looked benign but she continuously worries that it is not. Additionally, her father was diagnosed with throat cancer.  He is an alcoholic and smoked all his life. However, she is worried that throat cancer may be passed down from him. She understands these worries are excessive and unreasonable but she cannot stop. She denies hearing voices and denies visual hallucinations.  Sleep is still good most nights taking anywhere from 100 to 200 mg of trazodone and taking magnesium.  ROS: No SI or HI.  No panic attacks. No  tremors, no focal weakness, no paresthesias. No headaches, no abdominal pain, no vision or hearing abnormalities.  Past Medical History:  Diagnosis Date   Abnormal uterine bleeding    Dr. Despina Hidden. Nuvaring as of 09/2019   Bipolar disorder (HCC)    r/o OCD per psychiatrist 03/2016.  Also, psychologist eval revealed suspected Bipolar II, depressed phase 04/16/2016.   Dysmenorrhea    mgmt per Dr. Despina Hidden (01/2018).  Nuva Ring vag inserts 07/2018.   Erosive gastropathy 10/2018   Nonbleeding (EGD 10/2018)   GAD (generalized anxiety disorder)    +somatic symptom d/o vs hypochondriasis as well   Gestational diabetes    History of Salmonella gastroenteritis    2019   Infectious mononucleosis 2007   Lumbosacral radiculopathy at L5 2015   [facility]; then 2nd opinion with Dr. Shelle Iron at [facility], where she got ESI left L5-S1 11/2013 with moderate improvement.  Saw neurosurgeon 01/2016: surgery NOT recommended.   Severe recurrence 01/2021->rpt MRI w/out neural compression->plan for ESI L5-S1 left.   Migraine syndrome    Obesity, Class I, BMI 30-34.9    Pre-syncope 2016   ? POTS.  Holter showed mostly NSR, occ PACs, one run of nonsustained atrial tachy.   Pregnancy induced hypertension    Scoliosis     Past Surgical History:  Procedure Laterality Date   BIOPSY  10/26/2018   Procedure: BIOPSY;  Surgeon: Malissa Hippo, MD;  [facility];  Service: Endoscopy;;   CESAREAN SECTION N/A 09/17/2021   Procedure: CESAREAN SECTION;  Surgeon: Merian Capron  M, MD;  [facility];  Service: Obstetrics;  Laterality: N/A;   ESOPHAGOGASTRODUODENOSCOPY  10/2018   Nonbleeding erosive gastropathy, H pylori NEG.   ESOPHAGOGASTRODUODENOSCOPY (EGD) WITH PROPOFOL N/A 10/26/2018   Procedure: ESOPHAGOGASTRODUODENOSCOPY (EGD) WITH PROPOFOL;  Surgeon: Malissa Hippo, MD;  [facility];  Service: Endoscopy;  Laterality: N/A;   WISDOM TOOTH EXTRACTION        Current Outpatient Medications:    BIOTIN PO, Take by mouth daily., Disp: , Rfl:    Flaxseed, Linseed, (FLAXSEED OIL PO), Take by mouth., Disp: , Rfl:    lamoTRIgine (LAMICTAL) 200 MG tablet, Take 1 tablet (200 mg total) by mouth daily., Disp: 90 tablet, Rfl: 1   MAGNESIUM PO, Take by mouth at bedtime., Disp: , Rfl:    traZODone (DESYREL) 100 MG tablet, TAKE 2 TABLETS BY MOUTH AT BEDTIME, Disp: 60 tablet, Rfl: 2  EXAM:  VITALS per patient if applicable:     04/21/2023    4:02 PM 02/06/2023    2:01 PM 12/05/2022    2:58 PM  Vitals with BMI  Height  5\' 6"  5\' 6"   Weight 202 lbs 202 lbs 6 oz 203 lbs  BMI  32.68 32.78  Systolic  104   Diastolic  74   Pulse  97      GENERAL: alert, oriented, appears well and in no acute distress  HEENT: atraumatic, conjunttiva clear, no obvious abnormalities on inspection of external nose and ears  NECK: normal movements of the head and neck  LUNGS: on inspection no signs of respiratory distress, breathing rate appears normal, no obvious gross SOB, gasping or wheezing  CV: no obvious cyanosis  MS: moves all visible extremities without noticeable abnormality  PSYCH/NEURO: pleasant and cooperative, no obvious depression or anxiety, speech and thought processing grossly intact  LABS: none today    Chemistry      Component Value Date/Time   NA 138 09/27/2022 2221   NA 140 09/11/2021 1224   K 4.4 09/27/2022 2221   CL 105 09/27/2022 2221   CO2 25 09/27/2022 2221   BUN 9 09/27/2022 2221   BUN 10 09/11/2021 1224   CREATININE 0.70 09/27/2022 2221      Component Value Date/Time   CALCIUM 9.0 09/27/2022 2221   ALKPHOS 154 (H) 09/12/2021 2020   AST 20 09/12/2021 2020   ALT 14 09/12/2021 2020   BILITOT 0.3 09/12/2021 2020   BILITOT <0.2 09/11/2021 1224      ASSESSMENT AND PLAN:  Discussed the following assessment and plan:  #1 bipolar disorder, mild flare with a few days off of her Lamictal. She restarted this today.  Continue  200 mg a day.  She wants to get off of this medication eventually but I recommended she stay on it at least another 3 months before starting to wean down slowly.  2.  Insomnia, stable on trazodone 100 to 200 mg nightly.  3.  Hypochondriasis. Particularly severe. She has times during which she can push these thoughts back better than others. I think currently she is having a lot more trouble because of a dip in her mood since being off the lamotrigine a few days. Reassurance given today regarding her particular physical worries.  I discussed the assessment and treatment plan with the patient. The patient was provided an opportunity to ask questions and all were answered. The patient agreed with the plan and demonstrated an understanding of the instructions.   F/u: 6 weeks  Signed:  Michele Mcalpine  Frutoso Dimare, MD           04/21/2023

## 2023-04-21 NOTE — Progress Notes (Unsigned)
                Susan Jewell [name], Susan Chandler 

## 2023-04-21 NOTE — Telephone Encounter (Signed)
FYI  Please see below

## 2023-04-21 NOTE — Telephone Encounter (Signed)
Sorry Susan Chandler. I certainly did not mean for you to go without this medication. I do not know why the pharmacy declined to refill it. I just sent in a refill now so you can restart it.

## 2023-04-25 ENCOUNTER — Ambulatory Visit: Payer: 59 | Admitting: Behavioral Health

## 2023-04-25 DIAGNOSIS — F3170 Bipolar disorder, currently in remission, most recent episode unspecified: Secondary | ICD-10-CM

## 2023-04-25 DIAGNOSIS — F411 Generalized anxiety disorder: Secondary | ICD-10-CM

## 2023-04-25 NOTE — Progress Notes (Signed)
[facility] Counselor/Therapist Progress Note  Patient ID: Susan Chandler, MRN: [id number],    Date: 04/25/2023  Time Spent: 55 min Caregility video; Pt is outside in private & Provider is working remotely from [employer]. Pt is aware of risks/limitations of telehealth & consents to Tx today.  Time In: 1:00pm Time Out: 1:55pm   Treatment Type: Individual Therapy  Reported Symptoms: Elevated anx/dep & stress due to Family dynamics  Mental Status Exam: Appearance:  Casual     Behavior: Appropriate and Sharing  Motor: Normal  Speech/Language:  Clear and Coherent  Affect: Appropriate  Mood: normal  Thought process: normal  Thought content:   WNL  Sensory/Perceptual disturbances:   WNL  Orientation: oriented to person, place, time/date, and situation  Attention: Good  Concentration: Good  Memory: WNL  Fund of knowledge:  Good  Insight:   Good  Judgment:  Good  Impulse Control: Good   Risk Assessment: Danger to Self:  No Self-injurious Behavior: No Danger to Others: No Duty to Warn:no Physical Aggression / Violence:No  Access to Firearms a concern: No  Gang Involvement:No   Subjective: Family issues are imposing on the Cpl's dynamics & how to raise their young Toddler. Son has eating issues that are bothersome & she is trying to address these eating issues in the moment. The raising of their Son is entangled w/his dvlpmnt. Her Mother has called her Son a, "brat". She took this personally & things have escalated from there.   Son is attending sessions of SLP. He has apraxia, like his [employer].  Son cannot be Dx'd until he is 2yo. Son throws tantrums bc of this difficulty speaking. His frustration is exhibited by these beh'l outbursts. Mother has also given her input about Pt's mental health.   Mother determines many things for the Family Syst of Pt's Husb & Son. She confuses things for herself & her Family. She cannot gain her confidence in her Motherhood  living in the home. Things get blown out of proportion constantly. She feels blamed & charged w/issues in the Family dynamic.  Interventions: Family Systems  Diagnosis:Bipolar disorder in partial remission, most recent episode unspecified type (HCC)  Generalized anxiety disorder  Plan: Hazleigh will try to assert her Parental rights in the home in respectful ways. She will take note of the ways the Family supports her & the ways they do not. She will write these things down in her Notebook.  Target Date: 05/24/2023  Progress: 4  Frequency: Once every 2-3 wks  Modality: [name], LMFT

## 2023-04-25 NOTE — Progress Notes (Signed)
                Anastasya Jewell [name], LMFT 

## 2023-05-05 ENCOUNTER — Ambulatory Visit: Payer: 59 | Admitting: Family Medicine

## 2023-05-05 ENCOUNTER — Encounter: Payer: Self-pay | Admitting: Family Medicine

## 2023-05-05 VITALS — BP 118/80 | HR 79 | Temp 98.0°F | Ht 66.0 in | Wt 207.6 lb

## 2023-05-05 DIAGNOSIS — L812 Freckles: Secondary | ICD-10-CM

## 2023-05-05 DIAGNOSIS — M898X1 Other specified disorders of bone, shoulder: Secondary | ICD-10-CM

## 2023-05-05 DIAGNOSIS — M7918 Myalgia, other site: Secondary | ICD-10-CM | POA: Diagnosis not present

## 2023-05-05 DIAGNOSIS — K219 Gastro-esophageal reflux disease without esophagitis: Secondary | ICD-10-CM

## 2023-05-05 DIAGNOSIS — L819 Disorder of pigmentation, unspecified: Secondary | ICD-10-CM

## 2023-05-05 NOTE — Progress Notes (Signed)
OFFICE VISIT  05/05/2023  CC:  Chief Complaint  Patient presents with   Muscle Pain    Pt states possibly causing shortness of breath. Has marking on right arm close to elbow that hasn't gone away.     Patient is a 32 y.o. female who presents accompanied by her husband for right upper back pain.  HPI: About 1 wk hx pain in R scapula region, sharp, hurts with bending/turning neck. Carries her 28 lb toddler around a lot. No cough, SOB, CP, or abd pain or n/v.  Has had a couple of episodes of substernal burning that radiates up into the back of her throat.  Has an abnormal taste sensation when this happens.  She is drinking more citrus drinks lately, trying to avoid caffeinated beverages.  A couple of small areas of hyperpigmentation she has noticed lately, 1 on the right arm and 1 on the left gluteal region.  Past Medical History:  Diagnosis Date   Abnormal uterine bleeding    Dr. Despina Hidden. Nuvaring as of 09/2019   Bipolar disorder (HCC)    r/o OCD per psychiatrist 03/2016.  Also, psychologist eval revealed suspected Bipolar II, depressed phase 04/16/2016.   Dysmenorrhea    mgmt per Dr. Despina Hidden (01/2018).  Nuva Ring vag inserts 07/2018.   Erosive gastropathy 10/2018   Nonbleeding (EGD 10/2018)   GAD (generalized anxiety disorder)    +somatic symptom d/o vs hypochondriasis as well   Gestational diabetes    History of Salmonella gastroenteritis    2019   Hypochondriasis    Infectious mononucleosis 2007   Lumbosacral radiculopathy at L5 2015   [facility]. [facility] at [facility]; then 2nd opinion with Dr. Shelle Iron at [facility], where she got ESI left L5-S1 11/2013 with moderate improvement.  Saw neurosurgeon 01/2016: surgery NOT recommended.   Severe recurrence 01/2021->rpt MRI w/out neural compression->plan for ESI L5-S1 left.   Migraine syndrome    Obesity, Class I, BMI 30-34.9    Pre-syncope 2016   ? POTS.  Holter showed mostly NSR, occ PACs, one run of nonsustained atrial tachy.    Pregnancy induced hypertension    Scoliosis     Past Surgical History:  Procedure Laterality Date   BIOPSY  10/26/2018   Procedure: BIOPSY;  Surgeon: Malissa Hippo, MD;  [facility];  Service: Endoscopy;;   CESAREAN SECTION N/A 09/17/2021   Procedure: CESAREAN SECTION;  Surgeon: Venora Maples, MD;  [facility];  Service: Obstetrics;  Laterality: N/A;   ESOPHAGOGASTRODUODENOSCOPY  10/2018   Nonbleeding erosive gastropathy, H pylori NEG.   ESOPHAGOGASTRODUODENOSCOPY (EGD) WITH PROPOFOL N/A 10/26/2018   Procedure: ESOPHAGOGASTRODUODENOSCOPY (EGD) WITH PROPOFOL;  Surgeon: Malissa Hippo, MD;  [facility];  Service: Endoscopy;  Laterality: N/A;   WISDOM TOOTH EXTRACTION      Outpatient Medications Prior to Visit  Medication Sig Dispense Refill   BIOTIN PO Take by mouth daily.     Flaxseed, Linseed, (FLAXSEED OIL PO) Take by mouth.     lamoTRIgine (LAMICTAL) 200 MG tablet Take 1 tablet (200 mg total) by mouth daily. 90 tablet 1   MAGNESIUM PO Take by mouth at bedtime.     traZODone (DESYREL) 100 MG tablet TAKE 2 TABLETS BY MOUTH AT BEDTIME 60 tablet 2   No facility-administered medications prior to visit.    Allergies  Allergen Reactions   Shellfish Allergy Nausea And Vomiting    Review of Systems  As per HPI  PE:  05/05/2023    3:22 PM 04/21/2023    4:02 PM 02/06/2023    2:01 PM  Vitals with BMI  Height 5\' 6"   5\' 6"   Weight 207 lbs 10 oz 202 lbs 202 lbs 6 oz  BMI 33.52  32.68  Systolic 118  104  Diastolic 80  74  Pulse 79  97     Physical Exam  Gen: Alert, well appearing.  Patient is oriented to person, place, time, and situation. AFFECT: pleasant, lucid thought and speech. +TTP in a 10-12 cm region around medial border of right scapula. Lungs are clear bilaterally, good aeration. Cardiovascular: Regular rhythm and rate without murmur. Skin: Right forearm with punctate hyperpigmented macule. Left upper gluteal region with  very light brown macular lesion with irregular borders.  LABS:  Last CBC Lab Results  Component Value Date   WBC 11.2 (H) 09/27/2022   HGB 13.9 09/27/2022   HCT 42.1 09/27/2022   MCV 91.5 09/27/2022   MCH 30.2 09/27/2022   RDW 13.1 09/27/2022   PLT 368 09/27/2022   Last metabolic panel Lab Results  Component Value Date   GLUCOSE 97 09/27/2022   NA 138 09/27/2022   K 4.4 09/27/2022   CL 105 09/27/2022   CO2 25 09/27/2022   BUN 9 09/27/2022   CREATININE 0.70 09/27/2022   GFRNONAA >60 09/27/2022   CALCIUM 9.0 09/27/2022   PROT 5.7 (L) 09/12/2021   ALBUMIN 2.4 (L) 09/12/2021   LABGLOB 2.8 09/11/2021   AGRATIO 1.3 09/11/2021   BILITOT 0.3 09/12/2021   ALKPHOS 154 (H) 09/12/2021   AST 20 09/12/2021   ALT 14 09/12/2021   ANIONGAP 8 09/27/2022      IMPRESSION AND PLAN:  #1 muscle pain around the right scapular region. Reassured. Heat/ice regimen. Stretching/range of motion exercises recommended.  2.  GERD. Discussed dietary modification. May take Tums as needed. If gets more consistent she can start using Pepcid 10 to 20 mg over-the-counter daily as needed.  #3 benign focal skin hyperpigmentation. Reassured.  An After Visit Summary was printed and given to the patient.  FOLLOW UP: No follow-ups on file.  Signed:  Santiago Bumpers, MD           05/05/2023

## 2023-05-09 ENCOUNTER — Ambulatory Visit: Payer: 59 | Admitting: Behavioral Health

## 2023-05-09 DIAGNOSIS — F3170 Bipolar disorder, currently in remission, most recent episode unspecified: Secondary | ICD-10-CM

## 2023-05-09 DIAGNOSIS — F411 Generalized anxiety disorder: Secondary | ICD-10-CM

## 2023-05-09 NOTE — Progress Notes (Signed)
[facility] Counselor/Therapist Progress Note  Patient ID: Susan Chandler, MRN: [id number],    Date: 05/09/2023  Time Spent: 55 min Caregility video; Pt is outside in yard @ home & Provider is working remotely from [employer]. Pt is aware of the risks/limitations of telehealth & consent to Tx today. Time In: 10:00am Time Out: 10:55am   Treatment Type: Individual Therapy  Reported Symptoms: Pt is reviewing her Hx of moving to [place] area since [facility].   Mental Status Exam: Appearance:  Casual     Behavior: Appropriate and Sharing  Motor: Normal  Speech/Language:  Clear and Coherent  Affect: Appropriate  Mood: normal  Thought process: normal  Thought content:   WNL  Sensory/Perceptual disturbances:   WNL  Orientation: oriented to person, place, time/date, and situation  Attention: Good  Concentration: Good  Memory: WNL  Fund of knowledge:  Good  Insight:   Good  Judgment:  Good  Impulse Control: Good   Risk Assessment: Danger to Self:  No Self-injurious Behavior: No Danger to Others: No Duty to Warn:no Physical Aggression / Violence:No  Access to Firearms a concern: No  Gang Involvement:No   Subjective: Pt is upset over her Str Jaimie's judgment about her beh in the marriage w/her [employer]. This has lead to an argument w/her Str Jaimie. They have not resolved this issue yet. Str has been difficult to get along with in the past 2 wks.   This past Sunday is the Anniversary of my Str Casey's Bday. Pt is upset over the comments her Str Richmond Campbell is making about her & relationships w/men. She has been accused of flirting. Husb Deniece Portela has countered Jaimie's accusations.    Interventions: Insight-Oriented and Family Systems  Diagnosis:Bipolar disorder in partial remission, most recent episode unspecified type (HCC)  Generalized anxiety disorder  Plan: Santos will make efforts to listen to her Husb about the marital relationship. She will allow her grief to  manifest in ways she can approach healthfully. She is still manifesting issues regarding her breast health.   Target Date: 06/08/2023  Progress: 4  Frequency: Once every 2 wks  Modality: [name], LMFT

## 2023-05-09 NOTE — Progress Notes (Signed)
                Anastasya Jewell [name], LMFT 

## 2023-06-06 ENCOUNTER — Ambulatory Visit: Payer: 59 | Admitting: Behavioral Health

## 2023-06-06 ENCOUNTER — Encounter: Payer: Self-pay | Admitting: Family Medicine

## 2023-06-06 ENCOUNTER — Ambulatory Visit: Payer: 59 | Admitting: Family Medicine

## 2023-06-06 VITALS — BP 135/83 | HR 80 | Wt 208.0 lb

## 2023-06-06 DIAGNOSIS — L819 Disorder of pigmentation, unspecified: Secondary | ICD-10-CM | POA: Diagnosis not present

## 2023-06-06 DIAGNOSIS — L988 Other specified disorders of the skin and subcutaneous tissue: Secondary | ICD-10-CM | POA: Diagnosis not present

## 2023-06-06 DIAGNOSIS — F411 Generalized anxiety disorder: Secondary | ICD-10-CM

## 2023-06-06 DIAGNOSIS — F3181 Bipolar II disorder: Secondary | ICD-10-CM | POA: Diagnosis not present

## 2023-06-06 MED ORDER — LAMOTRIGINE 100 MG PO TABS
100.0000 mg | ORAL_TABLET | Freq: Every day | ORAL | 0 refills | Status: DC
Start: 2023-06-06 — End: 2023-09-01

## 2023-06-06 MED ORDER — TRAZODONE HCL 100 MG PO TABS: ORAL_TABLET | ORAL | 2 refills | Status: DC

## 2023-06-06 NOTE — Progress Notes (Signed)
OFFICE VISIT  06/06/2023  CC:  Chief Complaint  Patient presents with   Breast Problem    Spot on L breast; denies any irritation or pain. Has been there for a while.     Patient is a 32 y.o. female who presents accompanied by her husband for f/u bipolar d/o, anxiety, and insomnia. Also has concern about a spot on her breast. I last saw her on 05/05/2023. A/P as of that visit: "#1 muscle pain around the right scapular region. Reassured. Heat/ice regimen. Stretching/range of motion exercises recommended.   2.  GERD. Discussed dietary modification. May take Tums as needed. If gets more consistent she can start using Pepcid 10 to 20 mg over-the-counter daily as needed.   #3 benign focal skin hyperpigmentation. Reassured."  INTERIM HX: She notes some skin change on L breast near nipple. She feels like it is new but she can't really recall how new.  She wants to get off of her lamictal and trazodone. Says much of her problem has to do with some dysfunction in the relationship she has with her mother. Gets irritated because of the things her mother says.  Talks to counselor about this a lot.  Past Medical History:  Diagnosis Date   Abnormal uterine bleeding    Dr. Despina Hidden. Nuvaring as of 09/2019   Bipolar disorder (HCC)    r/o OCD per psychiatrist 03/2016.  Also, psychologist eval revealed suspected Bipolar II, depressed phase 04/16/2016.   Dysmenorrhea    mgmt per Dr. Despina Hidden (01/2018).  Nuva Ring vag inserts 07/2018.   Erosive gastropathy 10/2018   Nonbleeding (EGD 10/2018)   GAD (generalized anxiety disorder)    +somatic symptom d/o vs hypochondriasis as well   Gestational diabetes    History of Salmonella gastroenteritis    2019   Hypochondriasis    Infectious mononucleosis 2007   Lumbosacral radiculopathy at L5 2015   [facility]; then 2nd opinion with Dr. Shelle Iron at [facility], where she got ESI left L5-S1 11/2013 with moderate improvement.   Saw neurosurgeon 01/2016: surgery NOT recommended.   Severe recurrence 01/2021->rpt MRI w/out neural compression->plan for ESI L5-S1 left.   Migraine syndrome    Obesity, Class I, BMI 30-34.9    Pre-syncope 2016   ? POTS.  Holter showed mostly NSR, occ PACs, one run of nonsustained atrial tachy.   Pregnancy induced hypertension    Scoliosis     Past Surgical History:  Procedure Laterality Date   BIOPSY  10/26/2018   Procedure: BIOPSY;  Surgeon: Malissa Hippo, MD;  [facility];  Service: Endoscopy;;   CESAREAN SECTION N/A 09/17/2021   Procedure: CESAREAN SECTION;  Surgeon: Venora Maples, MD;  [facility]: [facility];  Service: Obstetrics;  Laterality: N/A;   ESOPHAGOGASTRODUODENOSCOPY  10/2018   Nonbleeding erosive gastropathy, H pylori NEG.   ESOPHAGOGASTRODUODENOSCOPY (EGD) WITH PROPOFOL N/A 10/26/2018   Procedure: ESOPHAGOGASTRODUODENOSCOPY (EGD) WITH PROPOFOL;  Surgeon: Malissa Hippo, MD;  [facility];  Service: Endoscopy;  Laterality: N/A;   WISDOM TOOTH EXTRACTION      Outpatient Medications Prior to Visit  Medication Sig Dispense Refill   BIOTIN PO Take by mouth daily.     Flaxseed, Linseed, (FLAXSEED OIL PO) Take by mouth.     lamoTRIgine (LAMICTAL) 200 MG tablet Take 1 tablet (200 mg total) by mouth daily. 90 tablet 1   MAGNESIUM PO Take by mouth at bedtime.     traZODone (DESYREL) 100  MG tablet TAKE 2 TABLETS BY MOUTH AT BEDTIME 60 tablet 2   No facility-administered medications prior to visit.    Allergies  Allergen Reactions   Shellfish Allergy Nausea And Vomiting    Review of Systems As per HPI  PE:    06/06/2023    1:46 PM 05/05/2023    3:22 PM 04/21/2023    4:02 PM  Vitals with BMI  Height  5\' 6"    Weight 208 lbs 207 lbs 10 oz 202 lbs  BMI  33.52   Systolic 135 118   Diastolic 83 80   Pulse 80 79    Physical Exam  Exam chaperoned by Cloe Motsinger, CMA L breast with 2 mm irregular shaped light brown flat nevus, with a  couple of speckles of darker brown pigment at the periphery.  The lesion is located a couple centimeters lateral to the areola  LABS:  none  IMPRESSION AND PLAN:  1) L breast pigmented skin lesion. Needs excision and path eval. She'll arrange return visit with me to get this done.  2) Bipolar d/o, anxiety, hypochondriasis, insomnia. I don't think she should come off of her medication b/c she has very high risk for relapse of bipolar symptoms. However, she has significant fear of longterm effects of medications in general and we initially compromised today and made plans to ween slowly off lamictal first:  dec to 100mg  every day for 3 mo before considering further decrease.   However, after the exam and discussion about her skin lesion today she broke down and agreed that this is not a good time to try weaning off her her medication. Plan is to continue 200mg  lamictal daily.  An After Visit Summary was printed and given to the patient.  FOLLOW UP: No follow-ups on file.  Signed:  Santiago Bumpers, MD           06/06/2023

## 2023-06-10 ENCOUNTER — Ambulatory Visit: Payer: 59 | Admitting: Behavioral Health

## 2023-06-10 DIAGNOSIS — F411 Generalized anxiety disorder: Secondary | ICD-10-CM | POA: Diagnosis not present

## 2023-06-10 DIAGNOSIS — F4521 Hypochondriasis: Secondary | ICD-10-CM

## 2023-06-10 DIAGNOSIS — F3170 Bipolar disorder, currently in remission, most recent episode unspecified: Secondary | ICD-10-CM | POA: Diagnosis not present

## 2023-06-10 NOTE — Progress Notes (Signed)
                Susan Jewell [name], LMFT 

## 2023-06-10 NOTE — Progress Notes (Deleted)
OFFICE NOTE  06/10/2023  CC: No chief complaint on file.    HPI: Patient is a 32 y.o. female who is here for skin lesion removal. I saw her for L breast pigmented lesion 4 days ago. Here for lesion excision today.  Pertinent PMH:  *** MEDS:  Outpatient Medications Prior to Visit  Medication Sig Dispense Refill   BIOTIN PO Take by mouth daily.     Flaxseed, Linseed, (FLAXSEED OIL PO) Take by mouth.     lamoTRIgine (LAMICTAL) 100 MG tablet Take 1 tablet (100 mg total) by mouth daily. 90 tablet 0   MAGNESIUM PO Take by mouth at bedtime.     traZODone (DESYREL) 100 MG tablet TAKE 2 TABLETS BY MOUTH AT BEDTIME 60 tablet 2   No facility-administered medications prior to visit.    PE: not currently breastfeeding. ***  IMPRESSION AND PLAN: ***  An After Visit Summary was printed and given to the patient.  FOLLOW UP: ***  Signed:  Santiago Bumpers, MD           06/10/2023

## 2023-06-10 NOTE — Progress Notes (Signed)
[facility] Counselor/Therapist Progress Note  Patient ID: Susan Chandler, MRN: [id number],    Date: 06/10/2023  Time Spent: 55 min Caregility video; Pt is in her car & Provider is working remotely from [facility] [employer]. Pt is aware of the risks/limitations of telehealth & consents to Tx today.  Time In: 10:00am Time Out: 10:55am   Treatment Type: Individual Therapy  Reported Symptoms: Elevated anx/dep & stress over the Holidays.   Mental Status Exam: Appearance:  Casual     Behavior: Appropriate and Sharing  Motor: Normal  Speech/Language:  Clear and Coherent  Affect: Appropriate  Mood: normal  Thought process: normal  Thought content:   Rumination  Sensory/Perceptual disturbances:   WNL  Orientation: oriented to person, place, time/date, and situation  Attention: Good  Concentration: Good  Memory: WNL  Fund of knowledge:  Good  Insight:   Good  Judgment:  Good  Impulse Control: Good   Risk Assessment: Danger to Self:  No Self-injurious Behavior: No Danger to Others: No Duty to Warn:no Physical Aggression / Violence:No  Access to Firearms a concern: No  Gang Involvement:No   Subjective: Pt is extremely worried for the new spots she has found on her breast. She has an intense fear of dying & cancer.    Pt is worried for her & Husb's Parenting Skills due to Mother's comments. She will monitor TV viewing for Son Susan Chandler & cont w/the HW from the Speech Th.   Interventions: Cognitive Behavioral Therapy; discussion of Family Health Hx & risks for Pt's own health.   Diagnosis:Bipolar disorder in partial remission, most recent episode unspecified type (HCC)  Hypochondriasis  Generalized anxiety disorder  Plan: Susan Chandler will get a Referral to a Dermatologist from Dr. Milinda Chandler. She will write down any questions she has for the visit. She will go on the [employer] to view all types of beautiful skin differences. She will keep her thinking balanced  by getting accurate medical information. She will not view the Pathology Report results on MyChart, she will wait until Dr. Milinda Chandler interprets the results for & with her.   Target Date: 07/24/2023  Progress: 4  Frequency: Once every 2-3 wks  Modality: Susan Chandler will trust her own Parenting skills/choices more moving forward. She will cont w/the SLP for her Son to treat apraxia. She will fllw the directions provided by the SLP & do the work @ home consistently.   Target Date: 07/24/2023  Progress: 4  Frequency: Once every 2-3 wks  Modality: Susan Fraise, LMFT

## 2023-06-11 ENCOUNTER — Encounter: Payer: Self-pay | Admitting: Urgent Care

## 2023-06-11 ENCOUNTER — Other Ambulatory Visit (HOSPITAL_COMMUNITY)
Admission: RE | Admit: 2023-06-11 | Discharge: 2023-06-11 | Disposition: A | Discharge status: Home / Self Care | Payer: 59 | Source: Ambulatory Visit | Attending: Family Medicine | Admitting: Family Medicine

## 2023-06-11 ENCOUNTER — Ambulatory Visit: Payer: 59 | Admitting: Family Medicine

## 2023-06-11 ENCOUNTER — Ambulatory Visit: Payer: 59 | Admitting: Urgent Care

## 2023-06-11 VITALS — BP 125/81 | HR 98 | Wt 208.0 lb

## 2023-06-11 DIAGNOSIS — L988 Other specified disorders of the skin and subcutaneous tissue: Secondary | ICD-10-CM | POA: Insufficient documentation

## 2023-06-11 DIAGNOSIS — L819 Disorder of pigmentation, unspecified: Secondary | ICD-10-CM

## 2023-06-11 NOTE — Assessment & Plan Note (Signed)
L upper chest lesion remains - discussed monitoring. Photos taken today. Currently 3mm x 3.83mm. Benign characteristics. Reassurance provided

## 2023-06-11 NOTE — Patient Instructions (Signed)
You had a 3mm skin lesion removed today via punch biopsy. We will contact you once the pathology report has been received.  For now, please shower like normal.  Avoid baths, hot tubs or swimming pools until the area is completely closed. Topically apply antibiotic ointment over the area 1-2x/ day during the day, cover with a non-stick pad or bandaid. Leave open at night.  Please read the attached handout and contact us if any concerning side effects occur.

## 2023-06-11 NOTE — Assessment & Plan Note (Signed)
3mm lesion near L areola removed today per pt request. Highly suspect benign melanocytic nevus.  Sent for pathology.

## 2023-06-11 NOTE — Progress Notes (Signed)
Established Patient Office Visit  Subjective:  Patient ID: Susan Chandler, female    DOB: Aug 14, 1990  Age: 32 y.o. MRN: [id number]  Chief Complaint  Patient presents with   Skin Lesion Removal    L breast pigmented skin lesion.    [name] presents today due to concerns primarily of a skin lesion to her L breast around 3 oclock near the areola. She is uncertain how long it has been there, but believes it to be new and doesn't like the color of it. She states she has been very nervous for the past 5 days losing sleep that this is cancerous and would like it removed for peace of mind. Denies personal hx of skin cancer.    Patient Active Problem List   Diagnosis Date Noted   Pigmented skin lesion 06/11/2023   Skin lesion of breast 06/11/2023   Mass of upper outer quadrant of left breast 01/08/2022   Depression with anxiety 12/24/2021   Mastitis 11/08/2021   Postpartum depression 10/29/2021   Cesarean delivery delivered 09/20/2021   H/O Gestational hypertension & PPHTN 09/14/2021   History of gestational diabetes 07/11/2021   Abdominal pain, epigastric 10/21/2018   RUQ pain 10/21/2018   Bipolar disorder (HCC)    Moderate single current episode of major depressive disorder (HCC) 04/05/2016   Social anxiety disorder 01/08/2016   Maxillary sinusitis, acute 09/12/2015   Generalized anxiety disorder 02/04/2014   Insomnia 02/04/2014   Left lumbar radiculopathy 10/18/2013   Past Medical History:  Diagnosis Date   Abnormal uterine bleeding    Dr. Despina Hidden. Nuvaring as of 09/2019   Bipolar disorder (HCC)    r/o OCD per psychiatrist 03/2016.  Also, psychologist eval revealed suspected Bipolar II, depressed phase 04/16/2016.   Dysmenorrhea    mgmt per Dr. Despina Hidden (01/2018).  Nuva Ring vag inserts 07/2018.   Erosive gastropathy 10/2018   Nonbleeding (EGD 10/2018)   GAD (generalized anxiety disorder)    +somatic symptom d/o vs hypochondriasis as well   Gestational diabetes     History of Salmonella gastroenteritis    2019   Hypochondriasis    Infectious mononucleosis 2007   Lumbosacral radiculopathy at L5 2015   [facility]; then 2nd opinion with Dr. Shelle Iron at [facility], where she got ESI left L5-S1 11/2013 with moderate improvement.  Saw neurosurgeon 01/2016: surgery NOT recommended.   Severe recurrence 01/2021->rpt MRI w/out neural compression->plan for ESI L5-S1 left.   Migraine syndrome    Obesity, Class I, BMI 30-34.9    Pre-syncope 2016   ? POTS.  Holter showed mostly NSR, occ PACs, one run of nonsustained atrial tachy.   Pregnancy induced hypertension    Scoliosis    Social History   Tobacco Use   Smoking status: Never   Smokeless tobacco: Never  Vaping Use   Vaping status: Never Used  Substance Use Topics   Alcohol use: No    Comment: 04-05-2016 per pt no   Drug use: No    Comment: 04-05-2016 per pt no       ROS: as noted in HPI  Objective:     BP 125/81   Pulse 98   Wt 208 lb (94.3 kg)   SpO2 98%   BMI 33.57 kg/m  BP Readings from Last 3 Encounters:  06/11/23 125/81  06/06/23 135/83  05/05/23 118/80   Wt Readings from Last 3 Encounters:  06/11/23 208 lb (94.3 kg)  06/06/23 208 lb (94.3 kg)  05/05/23 207 lb 9.6 oz (94.2 kg)       Physical Exam Vitals and nursing note reviewed. Exam conducted with a chaperone present.  Constitutional:      General: She is not in acute distress.    Appearance: Normal appearance. She is not ill-appearing, toxic-appearing or diaphoretic.  Pulmonary:     Effort: Pulmonary effort is normal. No respiratory distress.  Chest:       Comments: Photos below show the L upper chest skin lesion (depicted by #1) - watch and wait approach. Current measurements 3mm x 3.48mm Neurological:     General: No focal deficit present.     Mental Status: She is alert.           Skin excision  Date/Time: 06/11/2023 11:00 AM  Performed by: Maretta Bees,  PA Authorized by: Maretta Bees, PA   Number of Lesions: 1 Lesion 1:    Body area: trunk   Trunk location: L breast   Initial size (mm): 3   Final defect size (mm): 3   Malignancy: malignancy unknown     Destruction method: scissors used for extraction     Destruction method comment: 3mm punch biopsy used, with scissors to remove from dermal tissue  Comments:  3mm punch bx of skin lesion to L breast; performed under sterile field. 1cc 1% lidocaine with epi infiltrated into lesion. Complete removal, well tolerated. Skin left open to heal by secondary intention.   No results found for any visits on 06/11/23.    The ASCVD Risk score (Arnett DK, et al., 2019) failed to calculate for the following reasons:   The 2019 ASCVD risk score is only valid for ages 73 to 105  Assessment & Plan:  Pigmented skin lesion Assessment & Plan: L upper chest lesion remains - discussed monitoring. Photos taken today. Currently 3mm x 3.68mm. Benign characteristics. Reassurance provided   Skin lesion of breast Assessment & Plan: 3mm lesion near L areola removed today per pt request. Highly suspect benign melanocytic nevus.  Sent for pathology.  Orders: -     Skin biopsy; Future -     Surgical pathology    No follow-ups on file.   Maretta Bees, PA

## 2023-06-16 ENCOUNTER — Encounter: Payer: Self-pay | Admitting: Family Medicine

## 2023-06-16 NOTE — Telephone Encounter (Signed)
Hi Judyth, It is fine to take allegra 180 mg tab once a day and over the counter flonase daily. -PM

## 2023-06-17 LAB — SURGICAL PATHOLOGY

## 2023-06-24 ENCOUNTER — Ambulatory Visit: Payer: 59 | Admitting: Behavioral Health

## 2023-07-21 ENCOUNTER — Encounter: Payer: Self-pay | Admitting: Family Medicine

## 2023-07-21 NOTE — Telephone Encounter (Signed)
Just one linzess (290 mg) per day. Take it every day.

## 2023-07-23 ENCOUNTER — Other Ambulatory Visit: Payer: Self-pay | Admitting: Family Medicine

## 2023-08-07 ENCOUNTER — Encounter: Payer: Self-pay | Admitting: Family Medicine

## 2023-08-07 NOTE — Telephone Encounter (Signed)
Delsym (generic form is fine), take as directed on the packaging.

## 2023-08-08 ENCOUNTER — Encounter: Payer: Self-pay | Admitting: Family Medicine

## 2023-08-08 ENCOUNTER — Telehealth: Payer: 59 | Admitting: Family Medicine

## 2023-08-08 VITALS — Temp 100.0°F | Wt 206.0 lb

## 2023-08-08 DIAGNOSIS — J111 Influenza due to unidentified influenza virus with other respiratory manifestations: Secondary | ICD-10-CM | POA: Diagnosis not present

## 2023-08-08 MED ORDER — HYDROCODONE-ACETAMINOPHEN 5-325 MG PO TABS: 1.0000 | ORAL_TABLET | Freq: Four times a day (QID) | ORAL | 0 refills | Status: DC | PRN

## 2023-08-08 MED ORDER — OSELTAMIVIR PHOSPHATE 75 MG PO CAPS: 75.0000 mg | ORAL_CAPSULE | Freq: Two times a day (BID) | ORAL | 0 refills | Status: AC

## 2023-08-08 MED ORDER — HYDROCODONE BIT-HOMATROP MBR 5-1.5 MG/5ML PO SOLN: 5.0000 mL | Freq: Three times a day (TID) | ORAL | 0 refills | Status: DC | PRN

## 2023-08-08 NOTE — Telephone Encounter (Signed)
Okay. I sent in hydrocodone tabs.   These should help for both the pain and the cough. Side effect to expect is drowsiness. There is really no over-the-counter cough medicine that is more effective than Delsym.

## 2023-08-08 NOTE — Progress Notes (Signed)
Virtual Visit via Video Note  I connected with Susan Chandler  on 08/08/23 at  2:00 PM EST by a video enabled telemedicine application and verified that I am speaking with the correct person using two identifiers.  Location patient: [place] Location provider:work or home office Persons participating in the virtual visit: patient, provider  I discussed the limitations and requested verbal permission for telemedicine visit. The patient expressed understanding and agreed to proceed.  CC: cough  HPI: 33 year old female being seen today for cough. Onset about 36 hours ago: Cough, headache, fatigue, body aches, nasal congestion/runny nose, subjective fever. No shortness of breath, no chest pain. Her husband and her son had a respiratory illness within the last 10 days.  ROS: See pertinent positives and negatives per HPI.  Past Medical History:  Diagnosis Date   Abnormal uterine bleeding    Dr. Despina Hidden. Nuvaring as of 09/2019   Bipolar disorder (HCC)    r/o OCD per psychiatrist 03/2016.  Also, psychologist eval revealed suspected Bipolar II, depressed phase 04/16/2016.   Dysmenorrhea    mgmt per Dr. Despina Hidden (01/2018).  Nuva Ring vag inserts 07/2018.   Erosive gastropathy 10/2018   Nonbleeding (EGD 10/2018)   GAD (generalized anxiety disorder)    +somatic symptom d/o vs hypochondriasis as well   Gestational diabetes    History of Salmonella gastroenteritis    2019   Hypochondriasis    Infectious mononucleosis 2007   Lumbosacral radiculopathy at L5 2015   [facility]. [facility]; then 2nd opinion with Dr. Shelle Iron at [facility], where she got ESI left L5-S1 11/2013 with moderate improvement.  Saw neurosurgeon 01/2016: surgery NOT recommended.   Severe recurrence 01/2021->rpt MRI w/out neural compression->plan for ESI L5-S1 left.   Migraine syndrome    Obesity, Class I, BMI 30-34.9    Pre-syncope 2016   ? POTS.  Holter showed mostly NSR, occ PACs, one run of nonsustained atrial tachy.    Pregnancy induced hypertension    Scoliosis     Past Surgical History:  Procedure Laterality Date   BIOPSY  10/26/2018   Procedure: BIOPSY;  Surgeon: Malissa Hippo, MD;  [facility]: [facility];  Service: Endoscopy;;   CESAREAN SECTION N/A 09/17/2021   Procedure: CESAREAN SECTION;  Surgeon: Venora Maples, MD;  Location: [facility];  Service: Obstetrics;  Laterality: N/A;   ESOPHAGOGASTRODUODENOSCOPY  10/2018   Nonbleeding erosive gastropathy, H pylori NEG.   ESOPHAGOGASTRODUODENOSCOPY (EGD) WITH PROPOFOL N/A 10/26/2018   Procedure: ESOPHAGOGASTRODUODENOSCOPY (EGD) WITH PROPOFOL;  Surgeon: Malissa Hippo, MD;  Location: [facility];  Service: Endoscopy;  Laterality: N/A;   WISDOM TOOTH EXTRACTION       Current Outpatient Medications:    lamoTRIgine (LAMICTAL) 100 MG tablet, Take 1 tablet (100 mg total) by mouth daily., Disp: 90 tablet, Rfl: 0   MAGNESIUM PO, Take by mouth at bedtime., Disp: , Rfl:    Multiple Vitamin (MULTIVITAMIN ADULT PO), Take by mouth daily., Disp: , Rfl:    Probiotic Product (PROBIOTIC PO), Take 2 tablets by mouth daily., Disp: , Rfl:    traZODone (DESYREL) 100 MG tablet, TAKE 2 TABLETS BY MOUTH AT BEDTIME, Disp: 60 tablet, Rfl: 2  EXAM:  VITALS per patient if applicable:     08/08/2023    2:01 PM 06/11/2023   10:44 AM 06/06/2023    1:46 PM  Vitals with BMI  Weight 206 lbs 208 lbs 208 lbs  Systolic  125 135  Diastolic  81 83  Pulse  98 80     GENERAL: alert, oriented, appears well and in no acute distress  HEENT: atraumatic, conjunttiva clear, no obvious abnormalities on inspection of external nose and ears  NECK: normal movements of the head and neck  LUNGS: on inspection no signs of respiratory distress, breathing rate appears normal, no obvious gross SOB, gasping or wheezing  CV: no obvious cyanosis  MS: moves all visible extremities without noticeable abnormality  PSYCH/NEURO: pleasant and cooperative, no obvious depression or  anxiety, speech and thought processing grossly intact  LABS: none today    Chemistry      Component Value Date/Time   NA 138 09/27/2022 2221   NA 140 09/11/2021 1224   K 4.4 09/27/2022 2221   CL 105 09/27/2022 2221   CO2 25 09/27/2022 2221   BUN 9 09/27/2022 2221   BUN 10 09/11/2021 1224   CREATININE 0.70 09/27/2022 2221      Component Value Date/Time   CALCIUM 9.0 09/27/2022 2221   ALKPHOS 154 (H) 09/12/2021 2020   AST 20 09/12/2021 2020   ALT 14 09/12/2021 2020   BILITOT 0.3 09/12/2021 2020   BILITOT <0.2 09/11/2021 1224     ASSESSMENT AND PLAN:  Discussed the following assessment and plan:  Influenza-like illness. Influenza virus is definitely circulating in the community currently. Treat empirically with Tamiflu 75 mg twice daily x 5 days. Hycodan syrup, 1 teaspoon 3 times daily as needed, 120 mL. Ibuprofen 600 mg 3 times daily with food as needed.   I discussed the assessment and treatment plan with the patient. The patient was provided an opportunity to ask questions and all were answered. The patient agreed with the plan and demonstrated an understanding of the instructions.   F/U: if not signif imp in 3d  Signed:  Santiago Bumpers, MD           08/08/2023

## 2023-08-18 ENCOUNTER — Encounter: Payer: Self-pay | Admitting: Family Medicine

## 2023-08-18 NOTE — Telephone Encounter (Signed)
 Hi Susan Chandler, I know it sounds like scary terminology but this is nothing that you need to worry about. No worry for this being a sign of cancer. --PM

## 2023-09-01 ENCOUNTER — Telehealth: Payer: 59 | Admitting: Family Medicine

## 2023-09-01 ENCOUNTER — Encounter: Payer: Self-pay | Admitting: Family Medicine

## 2023-09-01 VITALS — BP 117/85 | Wt 207.0 lb

## 2023-09-01 DIAGNOSIS — F422 Mixed obsessional thoughts and acts: Secondary | ICD-10-CM

## 2023-09-01 DIAGNOSIS — F39 Unspecified mood [affective] disorder: Secondary | ICD-10-CM | POA: Diagnosis not present

## 2023-09-01 DIAGNOSIS — F5104 Psychophysiologic insomnia: Secondary | ICD-10-CM

## 2023-09-01 DIAGNOSIS — F4521 Hypochondriasis: Secondary | ICD-10-CM

## 2023-09-01 MED ORDER — LAMOTRIGINE 200 MG PO TABS: ORAL_TABLET | ORAL | Status: DC

## 2023-09-01 NOTE — Telephone Encounter (Unsigned)
 Copied from CRM [id number]. Topic: General - Billing Inquiry >> Sep 01, 2023 12:27 PM Alcus Dad wrote: Reason for CRM: Patient is wanting to make Co-pay before appt.

## 2023-09-01 NOTE — Progress Notes (Signed)
 Sent to [facility]. This encounter was created in error - please disregard.

## 2023-09-01 NOTE — Progress Notes (Signed)
 Virtual Visit via Video Note  I connected with Susan Chandler  on 09/01/23 at  2:00 PM EDT by a video enabled telemedicine application and verified that I am speaking with the correct person using two identifiers.  Location patient: Susan Chandler Location provider:work or home office Persons participating in the virtual visit: patient, provider  I discussed the limitations and requested verbal permission for telemedicine visit. The patient expressed understanding and agreed to proceed.  CC:  33 year old female being seen today for 72-month follow-up bipolar 2 and generalized anxiety with hypochondriasis.  INTERIM HX: Cole is feeling much better lately. She has changed therapist.  [facility] [place]. Her best impression is that she does not have bipolar disorder but likely has obsessive-compulsive disorder, with her main obsession being her health. Laquesha feels very good with the new therapist approaches to her treatment and she feels like she has made good progress.  She is avoiding social media, her MyChart account, and [employer]. Evalynn would like to wean down off of Lamictal to see how things go. Also of note, she has been sleeping well and has not even required trazodone in the last couple weeks.  Left breast skin lesion punch bx 06/11/23 by Guy Sandifer, PA--->pigmented seborrheic keratosis.  ROS: See pertinent positives and negatives per HPI.  Past Medical History:  Diagnosis Date   Abnormal uterine bleeding    Dr. Despina Hidden. Nuvaring as of 09/2019   Bipolar disorder (HCC)    r/o OCD per psychiatrist 03/2016.  Also, psychologist eval revealed suspected Bipolar II, depressed phase 04/16/2016.   Dysmenorrhea    mgmt per Dr. Despina Hidden (01/2018).  Nuva Ring vag inserts 07/2018.   Erosive gastropathy 10/2018   Nonbleeding (EGD 10/2018)   GAD (generalized anxiety disorder)    +somatic symptom d/o vs hypochondriasis as well   Gestational diabetes     History of Salmonella gastroenteritis    2019   Hypochondriasis    Infectious mononucleosis 2007   Lumbosacral radiculopathy at L5 2015   [facility]; then 2nd opinion with Dr. Shelle Iron at [facility], where she got ESI left L5-S1 11/2013 with moderate improvement.  Saw neurosurgeon 01/2016: surgery NOT recommended.   Severe recurrence 01/2021->rpt MRI w/out neural compression->plan for ESI L5-S1 left.   Migraine syndrome    Obesity, Class I, BMI 30-34.9    Pre-syncope 2016   ? POTS.  Holter showed mostly NSR, occ PACs, one run of nonsustained atrial tachy.   Pregnancy induced hypertension    Scoliosis     Past Surgical History:  Procedure Laterality Date   BIOPSY  10/26/2018   Procedure: BIOPSY;  Surgeon: Malissa Hippo, MD;  [facility];  Service: Endoscopy;;   CESAREAN SECTION N/A 09/17/2021   Procedure: CESAREAN SECTION;  Surgeon: Venora Maples, MD;  Location: [facility];  Service: Obstetrics;  Laterality: N/A;   ESOPHAGOGASTRODUODENOSCOPY  10/2018   Nonbleeding erosive gastropathy, H pylori NEG.   ESOPHAGOGASTRODUODENOSCOPY (EGD) WITH PROPOFOL N/A 10/26/2018   Procedure: ESOPHAGOGASTRODUODENOSCOPY (EGD) WITH PROPOFOL;  Surgeon: Malissa Hippo, MD;  [facility]: [facility];  Service: Endoscopy;  Laterality: N/A;   WISDOM TOOTH EXTRACTION       Current Outpatient Medications:    lamoTRIgine (LAMICTAL) 100 MG tablet, Take 1 tablet (100 mg total) by mouth daily., Disp: 90 tablet, Rfl: 0   MAGNESIUM PO, Take by mouth at bedtime., Disp: , Rfl:    Multiple  Vitamin (MULTIVITAMIN ADULT PO), Take by mouth daily., Disp: , Rfl:    Probiotic Product (PROBIOTIC PO), Take 2 tablets by mouth daily., Disp: , Rfl:    traZODone (DESYREL) 100 MG tablet, TAKE 2 TABLETS BY MOUTH AT BEDTIME, Disp: 60 tablet, Rfl: 2  EXAM:  VITALS per patient if applicable:     09/01/2023    2:06 PM 08/08/2023    2:01 PM 06/11/2023   10:44 AM  Vitals with BMI  Weight  207 lbs 206 lbs 208 lbs  Systolic 117  125  Diastolic 85  81  Pulse   98     GENERAL: alert, oriented, appears well and in no acute distress  HEENT: atraumatic, conjunttiva clear, no obvious abnormalities on inspection of external nose and ears  NECK: normal movements of the head and neck  LUNGS: on inspection no signs of respiratory distress, breathing rate appears normal, no obvious gross SOB, gasping or wheezing  CV: no obvious cyanosis  MS: moves all visible extremities without noticeable abnormality  PSYCH/NEURO: pleasant and cooperative, no obvious depression or anxiety, speech and thought processing grossly intact  LABS: none   Chemistry      Component Value Date/Time   NA 138 09/27/2022 2221   NA 140 09/11/2021 1224   K 4.4 09/27/2022 2221   CL 105 09/27/2022 2221   CO2 25 09/27/2022 2221   BUN 9 09/27/2022 2221   BUN 10 09/11/2021 1224   CREATININE 0.70 09/27/2022 2221      Component Value Date/Time   CALCIUM 9.0 09/27/2022 2221   ALKPHOS 154 (H) 09/12/2021 2020   AST 20 09/12/2021 2020   ALT 14 09/12/2021 2020   BILITOT 0.3 09/12/2021 2020   BILITOT <0.2 09/11/2021 1224     Lab Results  Component Value Date   WBC 11.2 (H) 09/27/2022   HGB 13.9 09/27/2022   HCT 42.1 09/27/2022   MCV 91.5 09/27/2022   PLT 368 09/27/2022   ASSESSMENT AND PLAN:  Discussed the following assessment and plan:  #1 mood disorder with generalized anxiety disorder. Bipolar 2 has been our working diagnosis.  However her new therapist brings up a good point about her possibly having OCD. Will go ahead and wean slowly off Lamictal to see how things go--> she currently takes one 200 mg tab nightly (her current med list has that she takes 100 mg nightly). Will have her take one half of her 200 mg tab nightly now for 2 weeks. Insomnia doing well lately.  She has trazodone to use on a as needed basis.  I discussed the assessment and treatment plan with the patient. The patient was  provided an opportunity to ask questions and all were answered. The patient agreed with the plan and demonstrated an understanding of the instructions.   F/u: 2 weeks  Signed:  Santiago Bumpers, MD           09/01/2023

## 2023-09-26 ENCOUNTER — Telehealth: Admitting: Family Medicine

## 2023-09-26 ENCOUNTER — Encounter: Payer: Self-pay | Admitting: Family Medicine

## 2023-09-26 ENCOUNTER — Ambulatory Visit: Admitting: Family Medicine

## 2023-09-26 VITALS — Wt 207.0 lb

## 2023-09-26 DIAGNOSIS — J029 Acute pharyngitis, unspecified: Secondary | ICD-10-CM

## 2023-09-26 DIAGNOSIS — F3181 Bipolar II disorder: Secondary | ICD-10-CM

## 2023-09-26 MED ORDER — LAMOTRIGINE 200 MG PO TABS: ORAL_TABLET | ORAL | Status: DC

## 2023-09-26 NOTE — Progress Notes (Signed)
 Virtual Visit via Video Note  I connected with Susan Chandler  on 09/26/23 at 10:20 AM EDT by a video enabled telemedicine application and verified that I am speaking with the correct person using two identifiers.  Location patient: [place] Location provider:work or home office Persons participating in the virtual visit: patient, provider  I discussed the limitations and requested verbal permission for telemedicine visit. The patient expressed understanding and agreed to proceed.  CC:  33 year old female being seen today for 3-week follow-up mood and anxiety. A/P as of last visit: "#1 mood disorder with generalized anxiety disorder. Bipolar 2 has been our working diagnosis.  However her new therapist brings up a good point about her possibly having OCD. Will go ahead and wean slowly off Lamictal to see how things go--> she currently takes one 200 mg tab nightly (her current med list has that she takes 100 mg nightly). Will have her take one half of her 200 mg tab nightly now for 2 weeks. Insomnia doing well lately.  She has trazodone to use on a as needed basis."  INTERIM HX: States she is doing well from an anxiety and depression standpoint since decreasing the Lamictal dose to 100 mg a day. She feels like she can decrease the dose some more.  About 6 weeks ago she had a significant influenza-like illness.  During that time she had some throat discomfort and the discomfort has continued in a waxing and waning fashion since then. Feels it on the left side more than right.  Sometimes she feels nothing abnormal at all.  Occasionally the discomfort will move around to the left ear.  She went to urgent care on 2 occasions recently to get this evaluated and was reassured each time. She is taking Flonase, Astepro, and Zyrtec for her seasonal allergies.   ROS: See pertinent positives and negatives per HPI.  Past Medical History:  Diagnosis Date   Abnormal uterine bleeding    Dr. Despina Hidden. Nuvaring as of  09/2019   Bipolar disorder (HCC)    r/o OCD per psychiatrist 03/2016.  Also, psychologist eval revealed suspected Bipolar II, depressed phase 04/16/2016.   Dysmenorrhea    mgmt per Dr. Despina Hidden (01/2018).  Nuva Ring vag inserts 07/2018.   Erosive gastropathy 10/2018   Nonbleeding (EGD 10/2018)   GAD (generalized anxiety disorder)    +somatic symptom d/o vs hypochondriasis as well   Gestational diabetes    History of Salmonella gastroenteritis    2019   Hypochondriasis    Infectious mononucleosis 2007   Lumbosacral radiculopathy at L5 2015   [facility]. [facility]se [facility]; then 2nd opinion with Dr. Shelle Iron at [facility], where she got ESI left L5-S1 11/2013 with moderate improvement.  Saw neurosurgeon 01/2016: surgery NOT recommended.   Severe recurrence 01/2021->rpt MRI w/out neural compression->plan for ESI L5-S1 left.   Migraine syndrome    Obesity, Class I, BMI 30-34.9    Pre-syncope 2016   ? POTS.  Holter showed mostly NSR, occ PACs, one run of nonsustained atrial tachy.   Pregnancy induced hypertension    Scoliosis     Past Surgical History:  Procedure Laterality Date   BIOPSY  10/26/2018   Procedure: BIOPSY;  Surgeon: Malissa Hippo, MD;  [facility];  Service: Endoscopy;;   CESAREAN SECTION N/A 09/17/2021   Procedure: CESAREAN SECTION;  Surgeon: Venora Maples, MD;  [facility];  Service: Obstetrics;  Laterality: N/A;   ESOPHAGOGASTRODUODENOSCOPY  10/2018   Nonbleeding  erosive gastropathy, H pylori NEG.   ESOPHAGOGASTRODUODENOSCOPY (EGD) WITH PROPOFOL N/A 10/26/2018   Procedure: ESOPHAGOGASTRODUODENOSCOPY (EGD) WITH PROPOFOL;  Surgeon: Malissa Hippo, MD;  [facility];  Service: Endoscopy;  Laterality: N/A;   WISDOM TOOTH EXTRACTION       Current Outpatient Medications:    Azelastine HCl (ASTEPRO NA), Place into the nose daily., Disp: , Rfl:    lamoTRIgine (LAMICTAL) 200 MG tablet, 1/2 tab po qd, Disp: , Rfl:    MAGNESIUM PO, Take by  mouth at bedtime., Disp: , Rfl:    Multiple Vitamin (MULTIVITAMIN ADULT PO), Take by mouth daily., Disp: , Rfl:    Probiotic Product (PROBIOTIC PO), Take 2 tablets by mouth daily., Disp: , Rfl:    traZODone (DESYREL) 100 MG tablet, TAKE 2 TABLETS BY MOUTH AT BEDTIME, Disp: 60 tablet, Rfl: 2   TURMERIC CURCUMIN PO, Take by mouth daily., Disp: , Rfl:   EXAM:  VITALS per patient if applicable:     09/26/2023   10:21 AM 09/01/2023    2:06 PM 08/08/2023    2:01 PM  Vitals with BMI  Weight 207 lbs 207 lbs 206 lbs  Systolic  117   Diastolic  85      GENERAL: alert, oriented, appears well and in no acute distress  HEENT: atraumatic, conjunttiva clear, no obvious abnormalities on inspection of external nose and ears  NECK: normal movements of the head and neck  LUNGS: on inspection no signs of respiratory distress, breathing rate appears normal, no obvious gross SOB, gasping or wheezing  CV: no obvious cyanosis  MS: moves all visible extremities without noticeable abnormality  PSYCH/NEURO: pleasant and cooperative, no obvious depression or anxiety, speech and thought processing grossly intact  LABS: none today  ASSESSMENT AND PLAN:  Discussed the following assessment and plan:  #1 bipolar 2 disorder, severe anxiety, OCD, hypochondriasis. She is stable as we wean down off Lamictal.  Will decrease dose to 50 mg daily (one quarter of a 200 mg tablet).  She is in counseling and this has been going very well. Reevaluate in 2 weeks.  2.  Throat discomfort. Question postnasal drip effect plus LPR.  Possible postviral effect. Reassured patient.  Expect gradual resolution.  I reassured her today that this is not throat cancer.  I discussed the assessment and treatment plan with the patient. The patient was provided an opportunity to ask questions and all were answered. The patient agreed with the plan and demonstrated an understanding of the instructions.   F/u: 2 weeks  Signed:  Santiago Bumpers, MD           09/26/2023

## 2023-09-30 ENCOUNTER — Encounter: Payer: Self-pay | Admitting: Family Medicine

## 2023-10-15 ENCOUNTER — Encounter: Payer: Self-pay | Admitting: Family Medicine

## 2023-10-15 ENCOUNTER — Telehealth: Admitting: Family Medicine

## 2023-10-15 VITALS — Wt 207.0 lb

## 2023-10-15 DIAGNOSIS — J9801 Acute bronchospasm: Secondary | ICD-10-CM

## 2023-10-15 DIAGNOSIS — J301 Allergic rhinitis due to pollen: Secondary | ICD-10-CM

## 2023-10-15 DIAGNOSIS — F411 Generalized anxiety disorder: Secondary | ICD-10-CM

## 2023-10-15 DIAGNOSIS — F3178 Bipolar disorder, in full remission, most recent episode mixed: Secondary | ICD-10-CM | POA: Diagnosis not present

## 2023-10-15 MED ORDER — LAMOTRIGINE 25 MG PO TABS: 25.0000 mg | ORAL_TABLET | Freq: Every day | ORAL | 0 refills | Status: DC

## 2023-10-15 MED ORDER — ALBUTEROL SULFATE HFA 108 (90 BASE) MCG/ACT IN AERS: 2.0000 | INHALATION_SPRAY | Freq: Four times a day (QID) | RESPIRATORY_TRACT | 0 refills | Status: DC | PRN

## 2023-10-15 NOTE — Addendum Note (Signed)
 Addended by: Shelvia Dick on: 10/15/2023 03:37 PM   Modules accepted: Level of Service

## 2023-10-15 NOTE — Progress Notes (Signed)
 Virtual Visit via Video Note  I connected with Susan Chandler  on 10/15/23 at  3:00 PM EDT by a video enabled telemedicine application and verified that I am speaking with the correct person using two identifiers.  Location patient: [place] Location provider:work or home office Persons participating in the virtual visit: patient, provider  I discussed the limitations and requested verbal permission for telemedicine visit. The patient expressed understanding and agreed to proceed.  CC:  33 year old female being seen today for 3-week follow-up bipolar disorder, anxiety disorder. A/P as of last visit: "1 bipolar 2 disorder, severe anxiety, OCD, hypochondriasis. She is stable as we wean down off Lamictal .  Will decrease dose to 50 mg daily (one quarter of a 200 mg tablet).  She is in counseling and this has been going very well. Reevaluate in 2 weeks.   2.  Throat discomfort. Question postnasal drip effect plus LPR.  Possible postviral effect. Reassured patient.  Expect gradual resolution.  I reassured her today that this is not throat cancer."  INTERIM HX: She continues to do well with weaning down Lamictal . She missed 1 dose and felt the "zaps".  She has had this before when weaning off of an antidepressant.  She is not alarmed by this. Her mood has been good, anxiety level stable.  She continues to see a counselor and reports that it is helping wonderfully.  She has significant allergic rhinitis symptoms for the last several weeks.  She has a dry cough, sometimes in fits.  Has trouble breathing at times when having a lot of coughing. No fever, no purulent mucus. No fatigue or malaise.    ROS: See pertinent positives and negatives per HPI.  Past Medical History:  Diagnosis Date   Abnormal uterine bleeding    Dr. Randolm Butte. Nuvaring as of 09/2019   Bipolar disorder (HCC)    r/o OCD per psychiatrist 03/2016.  Also, psychologist eval revealed suspected Bipolar II, depressed phase 04/16/2016.    Dysmenorrhea    mgmt per Dr. Randolm Butte (01/2018).  Nuva Ring vag inserts 07/2018.   Erosive gastropathy 10/2018   Nonbleeding (EGD 10/2018)   GAD (generalized anxiety disorder)    +somatic symptom d/o vs hypochondriasis as well   Gestational diabetes    History of Salmonella gastroenteritis    2019   Hypochondriasis    Infectious mononucleosis 2007   Lumbosacral radiculopathy at L5 2015   [facility]. Allyn [facility]renas at [facility]; then 2nd opinion with Dr. Leighton Punches at [facility] orth[facility], where she got ESI left L5-S1 11/2013 with moderate improvement.  Saw neurosurgeon 01/2016: surgery NOT recommended.   Severe recurrence 01/2021->rpt MRI w/out neural compression->plan for ESI L5-S1 left.   Migraine syndrome    Obesity, Class I, BMI 30-34.9    Pre-syncope 2016   ? POTS.  Holter showed mostly NSR, occ PACs, one run of nonsustained atrial tachy.   Pregnancy induced hypertension    Scoliosis     Past Surgical History:  Procedure Laterality Date   BIOPSY  10/26/2018   Procedure: BIOPSY;  Surgeon: Ruby Corporal, MD;  [facility];  Service: Endoscopy;;   CESAREAN SECTION N/A 09/17/2021   Procedure: CESAREAN SECTION;  Surgeon: Teena Feast, MD;  [facility];  Service: Obstetrics;  Laterality: N/A;   ESOPHAGOGASTRODUODENOSCOPY  10/2018   Nonbleeding erosive gastropathy, H pylori NEG.   ESOPHAGOGASTRODUODENOSCOPY (EGD) WITH PROPOFOL  N/A 10/26/2018   Procedure: ESOPHAGOGASTRODUODENOSCOPY (EGD) WITH PROPOFOL ;  Surgeon: Ruby Corporal, MD;  [facility];  Service: Endoscopy;  Laterality: N/A;   WISDOM TOOTH EXTRACTION       Current Outpatient Medications:    Azelastine HCl (ASTEPRO NA), Place into the nose daily., Disp: , Rfl:    lamoTRIgine  (LAMICTAL ) 200 MG tablet, 1/4 tab po qd, Disp: , Rfl:    MAGNESIUM PO, Take by mouth at bedtime., Disp: , Rfl:    Multiple Vitamin (MULTIVITAMIN ADULT PO), Take by mouth daily., Disp: , Rfl:    Probiotic Product (PROBIOTIC PO), Take 2  tablets by mouth daily., Disp: , Rfl:    traZODone  (DESYREL ) 100 MG tablet, TAKE 2 TABLETS BY MOUTH AT BEDTIME, Disp: 60 tablet, Rfl: 2   TURMERIC CURCUMIN PO, Take by mouth daily., Disp: , Rfl:   EXAM:  VITALS per patient if applicable:     10/15/2023    2:57 PM 09/26/2023   10:21 AM 09/01/2023    2:06 PM  Vitals with BMI  Weight 207 lbs 207 lbs 207 lbs  Systolic   117  Diastolic   85     GENERAL: alert, oriented, appears well and in no acute distress  HEENT: atraumatic, conjunttiva clear, no obvious abnormalities on inspection of external nose and ears  NECK: normal movements of the head and neck  LUNGS: on inspection no signs of respiratory distress, breathing rate appears normal, no obvious gross SOB, gasping or wheezing  CV: no obvious cyanosis  MS: moves all visible extremities without noticeable abnormality  PSYCH/NEURO: pleasant and cooperative, no obvious depression or anxiety, speech and thought processing grossly intact  LABS: none today  ASSESSMENT AND PLAN:  Discussed the following assessment and plan:  #1 1 bipolar 2 disorder, severe anxiety, OCD, hypochondriasis. She is stable as we wean down off Lamictal .  Will decrease dose to 25 mg daily  She continues with counseling and this has been going very well. Reevaluate in 2 weeks.   2.  Throat discomfort. Question postnasal drip effect plus LPR.  Possible postviral effect. This is gradually resolving.  #3 allergic rhinitis with bronchospasm. Start albuterol  2 puffs every 6 hours as needed.  She takes Flonase  and Astelin every day.   I discussed the assessment and treatment plan with the patient. The patient was provided an opportunity to ask questions and all were answered. The patient agreed with the plan and demonstrated an understanding of the instructions.   F/u: 2 wks  Signed:  Arletha Lady, MD           10/15/2023

## 2023-10-17 ENCOUNTER — Encounter: Payer: Self-pay | Admitting: Family Medicine

## 2023-10-22 ENCOUNTER — Encounter: Payer: Self-pay | Admitting: Family Medicine

## 2023-10-23 ENCOUNTER — Encounter: Payer: Self-pay | Admitting: Family Medicine

## 2023-10-23 NOTE — Telephone Encounter (Signed)
 Nothing further needed

## 2023-11-03 ENCOUNTER — Encounter: Payer: Self-pay | Admitting: Family Medicine

## 2023-11-03 ENCOUNTER — Telehealth: Admitting: Family Medicine

## 2023-11-03 DIAGNOSIS — F3181 Bipolar II disorder: Secondary | ICD-10-CM | POA: Diagnosis not present

## 2023-11-03 NOTE — Progress Notes (Signed)
 Virtual Visit via Video Note  I connected with Susan Chandler  on 11/03/23 at 10:40 AM EDT by a video enabled telemedicine application and verified that I am speaking with the correct person using two identifiers.  Location patient: [place] Location provider:work or home office Persons participating in the virtual visit: patient, provider  I discussed the limitations and requested verbal permission for telemedicine visit. The patient expressed understanding and agreed to proceed.  CC: 33 year old female being seen today for 3-week follow-up bipolar disorder, anxiety disorder. A/P as of last visit: "#1 1 bipolar 2 disorder, severe anxiety, OCD, hypochondriasis. She is stable as we wean down off Lamictal .  Will decrease dose to 25 mg daily  She continues with counseling and this has been going very well. Reevaluate in 2 weeks.   2.  Throat discomfort. Question postnasal drip effect plus LPR.  Possible postviral effect. This is gradually resolving.   #3 allergic rhinitis with bronchospasm. Start albuterol  2 puffs every 6 hours as needed.  She takes Flonase  and Astelin every day."  INTERIM HX: Susan Chandler has done very well getting off the Lamictal  completely. Mood is good, anxiety levels stable. She continues to see her therapist and really gets a lot out of this.   ROS: See pertinent positives and negatives per HPI.  Past Medical History:  Diagnosis Date   Abnormal uterine bleeding    Dr. Randolm Butte. Nuvaring as of 09/2019   Bipolar disorder (HCC)    r/o OCD per psychiatrist 03/2016.  Also, psychologist eval revealed suspected Bipolar II, depressed phase 04/16/2016.   Dysmenorrhea    mgmt per Dr. Randolm Butte (01/2018).  Nuva Ring vag inserts 07/2018.   Erosive gastropathy 10/2018   Nonbleeding (EGD 10/2018)   GAD (generalized anxiety disorder)    +somatic symptom d/o vs hypochondriasis as well   Gestational diabetes    History of Salmonella gastroenteritis    2019   Hypochondriasis    Infectious  mononucleosis 2007   Lumbosacral radiculopathy at L5 2015   Dr. [facility]lyn [facility]renas at [facility]; then 2nd opinion with Dr. Leighton Punches at [facility], where she got ESI left L5-S1 11/2013 with moderate improvement.  Saw neurosurgeon 01/2016: surgery NOT recommended.   Severe recurrence 01/2021->rpt MRI w/out neural compression->plan for ESI L5-S1 left.   Migraine syndrome    Obesity, Class I, BMI 30-34.9    Pre-syncope 2016   ? POTS.  Holter showed mostly NSR, occ PACs, one run of nonsustained atrial tachy.   Pregnancy induced hypertension    Scoliosis     Past Surgical History:  Procedure Laterality Date   BIOPSY  10/26/2018   Procedure: BIOPSY;  Surgeon: Ruby Corporal, MD;  [facility];  Service: Endoscopy;;   CESAREAN SECTION N/A 09/17/2021   Procedure: CESAREAN SECTION;  Surgeon: Teena Feast, MD;  [facility];  Service: Obstetrics;  Laterality: N/A;   ESOPHAGOGASTRODUODENOSCOPY  10/2018   Nonbleeding erosive gastropathy, H pylori NEG.   ESOPHAGOGASTRODUODENOSCOPY (EGD) WITH PROPOFOL  N/A 10/26/2018   Procedure: ESOPHAGOGASTRODUODENOSCOPY (EGD) WITH PROPOFOL ;  Surgeon: Ruby Corporal, MD;  [facility];  Service: Endoscopy;  Laterality: N/A;   WISDOM TOOTH EXTRACTION       Current Outpatient Medications:    albuterol  (VENTOLIN  HFA) 108 (90 Base) MCG/ACT inhaler, Inhale 2 puffs into the lungs every 6 (six) hours as needed for wheezing or shortness of breath., Disp: 8 g, Rfl: 0   Azelastine HCl (ASTEPRO NA), Place into the nose daily., Disp: ,  Rfl:    lamoTRIgine  (LAMICTAL ) 25 MG tablet, Take 1 tablet (25 mg total) by mouth daily., Disp: 30 tablet, Rfl: 0   MAGNESIUM PO, Take by mouth at bedtime., Disp: , Rfl:    Multiple Vitamin (MULTIVITAMIN ADULT PO), Take by mouth daily., Disp: , Rfl:    Probiotic Product (PROBIOTIC PO), Take 2 tablets by mouth daily., Disp: , Rfl:    traZODone  (DESYREL ) 100 MG tablet, TAKE 2 TABLETS BY MOUTH AT BEDTIME, Disp:  60 tablet, Rfl: 2   TURMERIC CURCUMIN PO, Take by mouth daily., Disp: , Rfl:   EXAM:  VITALS per patient if applicable:     10/15/2023    2:57 PM 09/26/2023   10:21 AM 09/01/2023    2:06 PM  Vitals with BMI  Weight 207 lbs 207 lbs 207 lbs  Systolic   117  Diastolic   85     GENERAL: alert, oriented, appears well and in no acute distress  HEENT: atraumatic, conjunttiva clear, no obvious abnormalities on inspection of external nose and ears  NECK: normal movements of the head and neck  LUNGS: on inspection no signs of respiratory distress, breathing rate appears normal, no obvious gross SOB, gasping or wheezing  CV: no obvious cyanosis  MS: moves all visible extremities without noticeable abnormality  PSYCH/NEURO: pleasant and cooperative, no obvious depression or anxiety, speech and thought processing grossly intact  LABS: none today  ASSESSMENT AND PLAN:  Discussed the following assessment and plan:   1 bipolar 2 disorder, severe anxiety, OCD, hypochondriasis. She is doing well now completely weaned off of Lamictal . Signs/symptoms to call or return for were reviewed and pt expressed understanding.  I discussed the assessment and treatment plan with the patient. The patient was provided an opportunity to ask questions and all were answered. The patient agreed with the plan and demonstrated an understanding of the instructions.   F/u: 3 months  Signed:  Arletha Lady, MD           11/03/2023

## 2023-11-06 ENCOUNTER — Other Ambulatory Visit: Payer: Self-pay | Admitting: Family Medicine

## 2023-12-18 ENCOUNTER — Encounter: Payer: Self-pay | Admitting: Family Medicine

## 2023-12-19 NOTE — Telephone Encounter (Signed)
 No further action needed at this time.

## 2024-01-29 ENCOUNTER — Encounter: Payer: Self-pay | Admitting: Family Medicine

## 2024-01-29 ENCOUNTER — Ambulatory Visit (INDEPENDENT_AMBULATORY_CARE_PROVIDER_SITE_OTHER): Admitting: Family Medicine

## 2024-01-29 ENCOUNTER — Ambulatory Visit: Admitting: Family Medicine

## 2024-01-29 VITALS — BP 104/69 | HR 95 | Temp 98.3°F | Ht 66.0 in | Wt 211.0 lb

## 2024-01-29 DIAGNOSIS — F3170 Bipolar disorder, currently in remission, most recent episode unspecified: Secondary | ICD-10-CM

## 2024-01-29 DIAGNOSIS — F411 Generalized anxiety disorder: Secondary | ICD-10-CM

## 2024-01-29 DIAGNOSIS — F429 Obsessive-compulsive disorder, unspecified: Secondary | ICD-10-CM | POA: Diagnosis not present

## 2024-01-29 NOTE — Progress Notes (Signed)
 OFFICE VISIT  02/08/2024  CC:  Chief Complaint  Patient presents with   Medical Management of Chronic Issues   Patient is a 33 y.o. female who presents for 15-month follow-up bipolar 2 disorder, generalized anxiety disorder, OCD. At the time of last visit she had done well with completely weaning off of Lamictal .  INTERIM HX: Feeling well both from a psychological and physical standpoint. She does not usually take trazodone  for sleep anymore at all. She continues to see a counselor and is happy with this.  Past Medical History:  Diagnosis Date   Abnormal uterine bleeding    Dr. Jayne. Nuvaring as of 09/2019   Bipolar disorder (HCC)    r/o OCD per psychiatrist 03/2016.  Also, psychologist eval revealed suspected Bipolar II, depressed phase 04/16/2016.   Dysmenorrhea    mgmt per Dr. Jayne (01/2018).  Nuva Ring vag inserts 07/2018.   Erosive gastropathy 10/2018   Nonbleeding (EGD 10/2018)   GAD (generalized anxiety disorder)    +somatic symptom d/o vs hypochondriasis as well   Gestational diabetes    History of Salmonella gastroenteritis    2019   Hypochondriasis    Infectious mononucleosis 2007   Lumbosacral radiculopathy at L5 2015   Dr. Darlean at [facility]; then 2nd opinion with Dr. Duwayne at [facility], where she got ESI left L5-S1 11/2013 with moderate improvement.  Saw neurosurgeon 01/2016: surgery NOT recommended.   Severe recurrence 01/2021->rpt MRI w/out neural compression->plan for ESI L5-S1 left.   Migraine syndrome    Obesity, Class I, BMI 30-34.9    Pre-syncope 2016   ? POTS.  Holter showed mostly NSR, occ PACs, one run of nonsustained atrial tachy.   Pregnancy induced hypertension    Scoliosis     Past Surgical History:  Procedure Laterality Date   BIOPSY  10/26/2018   Procedure: BIOPSY;  Surgeon: Golda Claudis PENNER, MD;  [facility];  Service: Endoscopy;;   CESAREAN SECTION N/A 09/17/2021   Procedure: CESAREAN SECTION;  Surgeon: Lola Donnice HERO, MD;  [facility];  Service: Obstetrics;  Laterality: N/A;   ESOPHAGOGASTRODUODENOSCOPY  10/2018   Nonbleeding erosive gastropathy, H pylori NEG.   ESOPHAGOGASTRODUODENOSCOPY (EGD) WITH PROPOFOL  N/A 10/26/2018   Procedure: ESOPHAGOGASTRODUODENOSCOPY (EGD) WITH PROPOFOL ;  Surgeon: Golda Claudis PENNER, MD;  [facility];  Service: Endoscopy;  Laterality: N/A;   WISDOM TOOTH EXTRACTION      Outpatient Medications Prior to Visit  Medication Sig Dispense Refill   MAGNESIUM PO Take by mouth at bedtime.     Probiotic Product (PROBIOTIC PO) Take 2 tablets by mouth daily.     albuterol  (VENTOLIN  HFA) 108 (90 Base) MCG/ACT inhaler INHALE 2 PUFFS INTO THE LUNGS EVERY 6 HOURS AS NEEDED FOR WHEEZE OR SHORTNESS OF BREATH (Patient not taking: Reported on 01/29/2024) 18 each 1   Azelastine HCl (ASTEPRO NA) Place into the nose daily. (Patient not taking: Reported on 01/29/2024)     lamoTRIgine  (LAMICTAL ) 25 MG tablet Take 1 tablet (25 mg total) by mouth daily. (Patient not taking: Reported on 01/29/2024) 30 tablet 0   Multiple Vitamin (MULTIVITAMIN ADULT PO) Take by mouth daily. (Patient not taking: Reported on 01/29/2024)     traZODone  (DESYREL ) 100 MG tablet TAKE 2 TABLETS BY MOUTH AT BEDTIME (Patient not taking: Reported on 01/29/2024) 60 tablet 2   TURMERIC CURCUMIN PO Take by mouth daily. (Patient not taking: Reported on 01/29/2024)     No facility-administered medications prior to visit.  Allergies  Allergen Reactions   Shellfish Allergy Nausea And Vomiting    Review of Systems As per HPI  PE:    01/29/2024    3:56 PM 10/15/2023    2:57 PM 09/26/2023   10:21 AM  Vitals with BMI  Height 5' 6    Weight 211 lbs 207 lbs 207 lbs  BMI 34.07    Systolic 104    Diastolic 69    Pulse 95       Physical Exam  Gen: Alert, well appearing.  Patient is oriented to person, place, time, and situation. AFFECT: pleasant, lucid thought and speech. No further exam today.  LABS:   None  IMPRESSION AND PLAN:  Bipolar affective disorder, GAD, OCD tendencies. She has been doing well with coping with her tendency to obsess about her health. Her counselor is helping a lot.  No prescription medications at this time other than occasional use of trazodone  for insomnia.  An After Visit Summary was printed and given to the patient.  FOLLOW UP: Return in about 3 months (around 04/30/2024) for routine chronic illness f/u.  Signed:  Gerlene Hockey, MD           02/08/2024

## 2024-02-06 ENCOUNTER — Encounter: Payer: Self-pay | Admitting: Family Medicine

## 2024-02-18 ENCOUNTER — Encounter: Payer: Self-pay | Admitting: Family Medicine

## 2024-02-19 MED ORDER — TRAZODONE HCL 100 MG PO TABS: ORAL_TABLET | ORAL | 5 refills | Status: AC

## 2024-02-19 NOTE — Telephone Encounter (Signed)
 traZODone  (DESYREL ) 100 MG tablet TAKE 2 TABLETS BY MOUTH AT BEDTIME Dispense: 60 tablet, Refills: 2 ordered  06/06/2023    Last OV 01/29/24, Please fill, if appropriate.

## 2024-03-12 ENCOUNTER — Other Ambulatory Visit: Payer: Self-pay | Admitting: Family Medicine

## 2024-07-21 ENCOUNTER — Encounter: Payer: Self-pay | Admitting: Family Medicine

## 2024-07-21 ENCOUNTER — Telehealth: Admitting: Family Medicine

## 2024-07-21 DIAGNOSIS — F411 Generalized anxiety disorder: Secondary | ICD-10-CM

## 2024-07-21 DIAGNOSIS — F99 Mental disorder, not otherwise specified: Secondary | ICD-10-CM

## 2024-07-21 DIAGNOSIS — F429 Obsessive-compulsive disorder, unspecified: Secondary | ICD-10-CM | POA: Diagnosis not present

## 2024-07-21 DIAGNOSIS — F5105 Insomnia due to other mental disorder: Secondary | ICD-10-CM | POA: Diagnosis not present

## 2024-07-21 DIAGNOSIS — F3178 Bipolar disorder, in full remission, most recent episode mixed: Secondary | ICD-10-CM | POA: Diagnosis not present

## 2024-07-21 DIAGNOSIS — J310 Chronic rhinitis: Secondary | ICD-10-CM | POA: Diagnosis not present

## 2024-07-21 NOTE — Progress Notes (Signed)
 Virtual Visit via Video Note  I connected with Susan Chandler  on 07/21/24 at  3:00 PM EST by a video enabled telemedicine application and verified that I am speaking with the correct person using two identifiers.  Location patient: [place] Location provider:work or home office Persons participating in the virtual visit: patient, provider  I discussed the limitations and requested verbal permission for telemedicine visit. The patient expressed understanding and agreed to proceed.  CC: 89-month follow-up bipolar disorder and anxiety. A/P as of last visit: Bipolar affective disorder, GAD, OCD tendencies. She has been doing well with coping with her tendency to obsess about her health.  Her counselor is helping a lot.  No prescription medications at this time other than occasional use of trazodone  for insomnia.  INTERIM HX: She is doing well. Still requiring trazodone  200 mg on most nights to sleep. She feels like her anxiety and mood are stable.  She continues to get great benefit from therapy. Her therapist has mentioned possible diagnosis of adult ADD.  Unfortunately she and her mom had a falling out last year which resulted in she ended moving out with her husband and son to a new home.  She feels like overall this has been a good step for everyone involved.   Regarding history of left ear/submandibular discomfort she says it has gradually improved and she only occasionally has a twinge of pain when she has been talking a lot.  Has chronic nasal congestion, no nasal mucus.  Her nose has blood when she wipes it most of the time.  No nasal pain.  She has wood heat. No sinus pressure or pain. Not currently using any medication for this.    ROS: See pertinent positives and negatives per HPI.  Past Medical History:  Diagnosis Date   Abnormal uterine bleeding    Dr. Jayne. Nuvaring as of 09/2019   Bipolar disorder (HCC)    r/o OCD per psychiatrist 03/2016.  Also, psychologist eval revealed  suspected Bipolar II, depressed phase 04/16/2016.   Dysmenorrhea    mgmt per Dr. Jayne (01/2018).  Nuva Ring vag inserts 07/2018.   Erosive gastropathy 10/2018   Nonbleeding (EGD 10/2018)   GAD (generalized anxiety disorder)    +somatic symptom d/o vs hypochondriasis as well   Gestational diabetes    History of Salmonella gastroenteritis    2019   Hypochondriasis    Infectious mononucleosis 2007   Lumbosacral radiculopathy at L5 2015   [facility]. [facility]lean at [facility]; then 2nd opinion with Dr. Duwayne at [facility], where she got ESI left L5-S1 11/2013 with moderate improvement.  Saw neurosurgeon 01/2016: surgery NOT recommended.   Severe recurrence 01/2021->rpt MRI w/out neural compression->plan for ESI L5-S1 left.   Migraine syndrome    Obesity, Class I, BMI 30-34.9    Pre-syncope 2016   ? POTS.  Holter showed mostly NSR, occ PACs, one run of nonsustained atrial tachy.   Pregnancy induced hypertension    Scoliosis     Past Surgical History:  Procedure Laterality Date   BIOPSY  10/26/2018   Procedure: BIOPSY;  Surgeon: Golda Claudis PENNER, MD;  Location: [facility];  Service: Endoscopy;;   CESAREAN SECTION N/A 09/17/2021   Procedure: CESAREAN SECTION;  Surgeon: Lola Donnice HERO, MD;  Location: [facility];  Service: Obstetrics;  Laterality: N/A;   ESOPHAGOGASTRODUODENOSCOPY  10/2018   Nonbleeding erosive gastropathy, H pylori NEG.   ESOPHAGOGASTRODUODENOSCOPY (EGD) WITH PROPOFOL  N/A 10/26/2018   Procedure: ESOPHAGOGASTRODUODENOSCOPY (EGD) WITH PROPOFOL ;  Surgeon: Golda Claudis PENNER, MD;  Location: [facility];  Service: Endoscopy;  Laterality: N/A;   WISDOM TOOTH EXTRACTION      Current Medications[1]  EXAM:  VITALS per patient if applicable:     01/29/2024    3:56 PM 10/15/2023    2:57 PM 09/26/2023   10:21 AM  Vitals with BMI  Height 5' 6    Weight 211 lbs 207 lbs 207 lbs  BMI 34.07    Systolic 104    Diastolic 69    Pulse 95       GENERAL: alert, oriented,  appears well and in no acute distress  HEENT: atraumatic, conjunttiva clear, no obvious abnormalities on inspection of external nose and ears  NECK: normal movements of the head and neck  LUNGS: on inspection no signs of respiratory distress, breathing rate appears normal, no obvious gross SOB, gasping or wheezing  CV: no obvious cyanosis  MS: moves all visible extremities without noticeable abnormality  PSYCH/NEURO: pleasant and cooperative, no obvious depression or anxiety, speech and thought processing grossly intact  LABS: none today    Chemistry      Component Value Date/Time   NA 138 09/27/2022 2221   NA 140 09/11/2021 1224   K 4.4 09/27/2022 2221   CL 105 09/27/2022 2221   CO2 25 09/27/2022 2221   BUN 9 09/27/2022 2221   BUN 10 09/11/2021 1224   CREATININE 0.70 09/27/2022 2221      Component Value Date/Time   CALCIUM 9.0 09/27/2022 2221   ALKPHOS 154 (H) 09/12/2021 2020   AST 20 09/12/2021 2020   ALT 14 09/12/2021 2020   BILITOT 0.3 09/12/2021 2020   BILITOT <0.2 09/11/2021 1224     ASSESSMENT AND PLAN:  Discussed the following assessment and plan:  #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 She finds the trazodone  200 mg nightly helpful. Continue current management.  2.  GAD, history of bipolar disorder in remission.  She has OCD tendencies. Currently on no medications and continues to do well with therapy. Regarding possible diagnosis of adult ADD we will avoid stimulants if we end up treating with medication in the future.  Will possibly do trial of Strattera or Intuniv.  #3 chronic rhinitis. She can restart Astelin 2 sprays each nostril every 12 hours as needed. Also can restart Flonase  2 sprays each nostril daily. Encouraged her to use saline nasal spray regularly.  I discussed the assessment and treatment plan with the patient. The patient was provided an opportunity to ask questions and all were answered. The patient agreed with the plan and demonstrated an  understanding of the instructions.   F/u: 3 months  Signed:  Gerlene Hockey, MD           07/21/2024     [1]  Current Outpatient Medications:    Cetirizine HCl (ZYRTEC PO), Take by mouth daily., Disp: , Rfl:    MAGNESIUM PO, Take by mouth at bedtime., Disp: , Rfl:    Probiotic Product (PROBIOTIC PO), Take 2 tablets by mouth daily., Disp: , Rfl:    SALINE SPRAY NA, Place into the nose daily., Disp: , Rfl:    traZODone  (DESYREL ) 100 MG tablet, TAKE 2 TABLETS BY MOUTH AT BEDTIME, Disp: 60 tablet, Rfl: 5   TURMERIC CURCUMIN PO, Take by mouth daily., Disp: , Rfl:
